# Patient Record
Sex: Female | Born: 1940 | Race: White | Hispanic: No | Smoking: Former smoker
Health system: Southern US, Community
[De-identification: ages and names within clinical notes are randomized; demographics above are authoritative.]

## PROBLEM LIST (undated history)

## (undated) DIAGNOSIS — M48061 Spinal stenosis, lumbar region without neurogenic claudication: Secondary | ICD-10-CM

## (undated) DIAGNOSIS — R519 Headache, unspecified: Secondary | ICD-10-CM

## (undated) DIAGNOSIS — E039 Hypothyroidism, unspecified: Secondary | ICD-10-CM

## (undated) DIAGNOSIS — M549 Dorsalgia, unspecified: Secondary | ICD-10-CM

## (undated) DIAGNOSIS — N390 Urinary tract infection, site not specified: Secondary | ICD-10-CM

## (undated) DIAGNOSIS — IMO0001 Reserved for inherently not codable concepts without codable children: Secondary | ICD-10-CM

## (undated) DIAGNOSIS — K219 Gastro-esophageal reflux disease without esophagitis: Secondary | ICD-10-CM

## (undated) DIAGNOSIS — I10 Essential (primary) hypertension: Secondary | ICD-10-CM

## (undated) DIAGNOSIS — H33319 Horseshoe tear of retina without detachment, unspecified eye: Secondary | ICD-10-CM

## (undated) DIAGNOSIS — I509 Heart failure, unspecified: Secondary | ICD-10-CM

## (undated) DIAGNOSIS — N261 Atrophy of kidney (terminal): Secondary | ICD-10-CM

## (undated) DIAGNOSIS — J189 Pneumonia, unspecified organism: Secondary | ICD-10-CM

## (undated) DIAGNOSIS — F329 Major depressive disorder, single episode, unspecified: Secondary | ICD-10-CM

## (undated) DIAGNOSIS — F32A Depression, unspecified: Secondary | ICD-10-CM

## (undated) DIAGNOSIS — J449 Chronic obstructive pulmonary disease, unspecified: Secondary | ICD-10-CM

## (undated) DIAGNOSIS — Z973 Presence of spectacles and contact lenses: Secondary | ICD-10-CM

## (undated) DIAGNOSIS — C801 Malignant (primary) neoplasm, unspecified: Secondary | ICD-10-CM

## (undated) DIAGNOSIS — R112 Nausea with vomiting, unspecified: Secondary | ICD-10-CM

## (undated) DIAGNOSIS — D649 Anemia, unspecified: Secondary | ICD-10-CM

## (undated) DIAGNOSIS — E785 Hyperlipidemia, unspecified: Secondary | ICD-10-CM

## (undated) DIAGNOSIS — F419 Anxiety disorder, unspecified: Secondary | ICD-10-CM

## (undated) DIAGNOSIS — G4730 Sleep apnea, unspecified: Secondary | ICD-10-CM

## (undated) DIAGNOSIS — G8929 Other chronic pain: Secondary | ICD-10-CM

## (undated) DIAGNOSIS — R51 Headache: Secondary | ICD-10-CM

## (undated) DIAGNOSIS — G459 Transient cerebral ischemic attack, unspecified: Secondary | ICD-10-CM

## (undated) DIAGNOSIS — J45909 Unspecified asthma, uncomplicated: Secondary | ICD-10-CM

## (undated) DIAGNOSIS — Z9889 Other specified postprocedural states: Secondary | ICD-10-CM

## (undated) DIAGNOSIS — R569 Unspecified convulsions: Secondary | ICD-10-CM

## (undated) DIAGNOSIS — M1990 Unspecified osteoarthritis, unspecified site: Secondary | ICD-10-CM

## (undated) HISTORY — PX: OTHER SURGICAL HISTORY: SHX169

## (undated) HISTORY — PX: CARDIAC SURGERY: SHX584

## (undated) HISTORY — PX: CHOLECYSTECTOMY: SHX55

## (undated) HISTORY — PX: COLONOSCOPY: SHX174

## (undated) HISTORY — PX: CATARACT EXTRACTION W/ INTRAOCULAR LENS  IMPLANT, BILATERAL: SHX1307

## (undated) HISTORY — DX: Essential (primary) hypertension: I10

## (undated) HISTORY — PX: TUBAL LIGATION: SHX77

## (undated) HISTORY — PX: BREAST SURGERY: SHX581

## (undated) HISTORY — PX: MULTIPLE TOOTH EXTRACTIONS: SHX2053

## (undated) HISTORY — DX: Hyperlipidemia, unspecified: E78.5

## (undated) HISTORY — PX: LUMBAR EPIDURAL INJECTION: SHX1980

## (undated) HISTORY — PX: HERNIA REPAIR: SHX51

---

## 2014-03-25 ENCOUNTER — Encounter: Payer: Self-pay | Admitting: Family Medicine

## 2014-03-25 ENCOUNTER — Ambulatory Visit (INDEPENDENT_AMBULATORY_CARE_PROVIDER_SITE_OTHER): Payer: Medicare HMO | Admitting: Family Medicine

## 2014-03-25 ENCOUNTER — Ambulatory Visit (HOSPITAL_BASED_OUTPATIENT_CLINIC_OR_DEPARTMENT_OTHER)
Admission: RE | Admit: 2014-03-25 | Discharge: 2014-03-25 | Disposition: A | Discharge status: Home / Self Care | Payer: Medicare HMO | Source: Ambulatory Visit | Attending: Family Medicine | Admitting: Family Medicine

## 2014-03-25 ENCOUNTER — Encounter (INDEPENDENT_AMBULATORY_CARE_PROVIDER_SITE_OTHER): Payer: Self-pay

## 2014-03-25 VITALS — BP 119/71 | HR 70 | Temp 97.9°F | Resp 22 | Ht 68.0 in | Wt 229.6 lb

## 2014-03-25 DIAGNOSIS — S6990XA Unspecified injury of unspecified wrist, hand and finger(s), initial encounter: Secondary | ICD-10-CM

## 2014-03-25 DIAGNOSIS — Z4789 Encounter for other orthopedic aftercare: Secondary | ICD-10-CM | POA: Insufficient documentation

## 2014-03-25 DIAGNOSIS — IMO0001 Reserved for inherently not codable concepts without codable children: Secondary | ICD-10-CM

## 2014-03-25 DIAGNOSIS — M79641 Pain in right hand: Secondary | ICD-10-CM

## 2014-03-25 DIAGNOSIS — M79609 Pain in unspecified limb: Secondary | ICD-10-CM

## 2014-03-25 DIAGNOSIS — S6980XA Other specified injuries of unspecified wrist, hand and finger(s), initial encounter: Secondary | ICD-10-CM

## 2014-03-29 ENCOUNTER — Encounter: Payer: Self-pay | Admitting: Family Medicine

## 2014-03-29 DIAGNOSIS — IMO0001 Reserved for inherently not codable concepts without codable children: Secondary | ICD-10-CM | POA: Insufficient documentation

## 2014-03-29 NOTE — Assessment & Plan Note (Signed)
2/2 sprain of 3rd MCP joint.  No fracture in this area - no pain where radiologist read possible avulsion fracture.  Discontinue splinting - buddy tape for next 2 weeks then as needed.  Icing, tylenol, nsaids as needed.  F/u prn.

## 2014-03-29 NOTE — Progress Notes (Signed)
Patient ID: Jill Bryant, female   DOB: 01/04/1941, 73 y.o.   MRN: [id number]  PCP: Rodena Medin, MD  Subjective:   HPI: Patient is a 73 y.o. female here for right hand pain.  Patient reports 4 days ago she was flicking a bug with her 3rd finger when she felt pain dorsal aspect of around MCP joint of this finger. Associated swelling but no bruising. Didn't hit anything when she tried to flick the bug. No prior injuries. Had x-rays at an urgent care (not available) and was told she broke proximal phalanx - put in an extension splint. Is right handed. Pain down to 1/10 currently. No numbness/tingling.  Past Medical History  Diagnosis Date  . Hyperlipidemia   . Hypertension     No current outpatient prescriptions on file prior to visit.   No current facility-administered medications on file prior to visit.    Past Surgical History  Procedure Laterality Date  . Cardiac surgery      valve replacement 2013; aorta replacement 2002  . Hernia repair    . Cholecystectomy      Allergies  Allergen Reactions  . Metoprolol Other (See Comments)    Bradycardia and fatigue  . Morphine And Related Other (See Comments)    Hallucinations/seizures  . Tape Rash    History   Social History  . Marital Status: Widowed    Spouse Name: N/A    Number of Children: N/A  . Years of Education: N/A   Occupational History  . Not on file.   Social History Main Topics  . Smoking status: Former Smoker    Quit date: 10/30/2003  . Smokeless tobacco: Not on file  . Alcohol Use: Not on file  . Drug Use: Not on file  . Sexual Activity: Not on file   Other Topics Concern  . Not on file   Social History Narrative  . No narrative on file    No family history on file.  BP 119/71  Pulse 70  Temp(Src) 97.9 F (36.6 C) (Oral)  Resp 22  Ht 5\' 8"  (1.727 m)  Wt 229 lb 9.6 oz (104.146 kg)  BMI 34.92 kg/m2  SpO2 96%  Review of Systems: See HPI above.    Objective:  Physical  Exam:  Gen: NAD  Right hand: Swelling about 3rd MCP joint circumferentially.  No bruising, other deformity. Mild TTP about 3rd MCP joint.  No phalanx, other hand tenderness. Able to flex and extend 3rd MCP, PIP, DIP joints against resistance without pain. NVI distally.    Assessment & Plan:  1. Right 3rd digit injury - 2/2 sprain of 3rd MCP joint.  No fracture in this area - no pain where radiologist read possible avulsion fracture.  Discontinue splinting - buddy tape for next 2 weeks then as needed.  Icing, tylenol, nsaids as needed.  F/u prn.

## 2015-01-27 ENCOUNTER — Telehealth (HOSPITAL_COMMUNITY): Payer: Self-pay | Admitting: *Deleted

## 2015-01-27 NOTE — Telephone Encounter (Addendum)
Pt left a message saying that she needs to have an echo. She had an appt with Dr. Debara Pickett on 04/25 but its been cancelled. Pt will have her pulmonary physician send over an order for the echo

## 2015-01-28 ENCOUNTER — Other Ambulatory Visit (HOSPITAL_COMMUNITY): Payer: Self-pay | Admitting: Medical

## 2015-01-28 DIAGNOSIS — R0609 Other forms of dyspnea: Secondary | ICD-10-CM

## 2015-01-28 DIAGNOSIS — R0902 Hypoxemia: Secondary | ICD-10-CM

## 2015-02-16 ENCOUNTER — Ambulatory Visit (HOSPITAL_COMMUNITY)
Admission: RE | Admit: 2015-02-16 | Discharge: 2015-02-16 | Disposition: A | Discharge status: Home / Self Care | Payer: Medicare HMO | Source: Ambulatory Visit | Attending: Cardiovascular Disease | Admitting: Cardiovascular Disease

## 2015-02-16 ENCOUNTER — Telehealth: Payer: Self-pay | Admitting: Internal Medicine

## 2015-02-16 DIAGNOSIS — R0609 Other forms of dyspnea: Secondary | ICD-10-CM | POA: Insufficient documentation

## 2015-02-16 DIAGNOSIS — E785 Hyperlipidemia, unspecified: Secondary | ICD-10-CM | POA: Diagnosis not present

## 2015-02-16 DIAGNOSIS — I10 Essential (primary) hypertension: Secondary | ICD-10-CM | POA: Diagnosis not present

## 2015-02-16 DIAGNOSIS — Z87891 Personal history of nicotine dependence: Secondary | ICD-10-CM | POA: Insufficient documentation

## 2015-02-16 DIAGNOSIS — R0902 Hypoxemia: Secondary | ICD-10-CM

## 2015-02-16 NOTE — Progress Notes (Signed)
2D Echocardiogram Complete.  02/16/2015   Brynlynn Walko [place], RDCS

## 2015-02-17 NOTE — Telephone Encounter (Signed)
Close encounter 

## 2015-02-21 ENCOUNTER — Ambulatory Visit: Payer: Medicare HMO | Admitting: Internal Medicine

## 2015-02-23 ENCOUNTER — Ambulatory Visit (INDEPENDENT_AMBULATORY_CARE_PROVIDER_SITE_OTHER): Payer: Medicare HMO | Admitting: Internal Medicine

## 2015-02-23 ENCOUNTER — Encounter: Payer: Self-pay | Admitting: Internal Medicine

## 2015-02-23 VITALS — BP 122/70 | HR 84 | Ht 68.0 in | Wt 232.5 lb

## 2015-02-23 DIAGNOSIS — J449 Chronic obstructive pulmonary disease, unspecified: Secondary | ICD-10-CM

## 2015-02-23 DIAGNOSIS — I493 Ventricular premature depolarization: Secondary | ICD-10-CM | POA: Diagnosis not present

## 2015-02-23 DIAGNOSIS — Z95828 Presence of other vascular implants and grafts: Secondary | ICD-10-CM

## 2015-02-23 DIAGNOSIS — Z9989 Dependence on other enabling machines and devices: Secondary | ICD-10-CM

## 2015-02-23 DIAGNOSIS — I739 Peripheral vascular disease, unspecified: Secondary | ICD-10-CM

## 2015-02-23 DIAGNOSIS — Z954 Presence of other heart-valve replacement: Secondary | ICD-10-CM

## 2015-02-23 DIAGNOSIS — Z9889 Other specified postprocedural states: Secondary | ICD-10-CM | POA: Diagnosis not present

## 2015-02-23 DIAGNOSIS — G4733 Obstructive sleep apnea (adult) (pediatric): Secondary | ICD-10-CM

## 2015-02-23 DIAGNOSIS — I779 Disorder of arteries and arterioles, unspecified: Secondary | ICD-10-CM | POA: Insufficient documentation

## 2015-02-23 DIAGNOSIS — Z952 Presence of prosthetic heart valve: Secondary | ICD-10-CM

## 2015-02-23 DIAGNOSIS — I2510 Atherosclerotic heart disease of native coronary artery without angina pectoris: Secondary | ICD-10-CM

## 2015-02-23 DIAGNOSIS — I2583 Coronary atherosclerosis due to lipid rich plaque: Secondary | ICD-10-CM

## 2015-02-23 NOTE — Progress Notes (Signed)
OFFICE NOTE  Chief Complaint:  Establish a new cardiologist  Primary Care Physician: Rodena Medin, MD  HPI:  Jill Bryant is a pleasant 74 year old female with a list of about 40-50 different medical problems. She gets most of her care to the [facility] but due to new changes her insurance is no longer accepted there. She therefore sought out a new cardiologist in [place]. She actually lives in [place] but establish most of her care through [facility] [place] [facility] when she was originally living with family in that area. For a long time she lived in the [place] area. In 2002 she was diagnosed as having PAD and significant aortic occlusion. Ultimately she underwent aortobifem bypass by Dr. Thurnell Lose in 2002. 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 Jerelene Redden in [facility]. She was present followed by Dr. Baxter Hire back in 2013 and also saw Dr. Joylene Grapes. She has significant COPD and is followed by [facility]. She also has obstructive sleep apnea on CPAP and uses oxygen. She tells me she has stage III chronic kidney disease and a solitary kidney. She may also recently had an amaurosis fugax event. Currently she denies any chest pain. She reports stable shortness of breath with exertion. She denies palpitations but as noted have PVCs on her EKG today. She is not on beta blocker presumably due to her significant pulmonary disease and it is listed as an allergy. She told me that she had severe leg weakness previously on metoprolol.  PMHx:  Past Medical History  Diagnosis Date  . Hyperlipidemia   . Hypertension     Past Surgical History  Procedure Laterality Date  . Cardiac surgery      valve replacement 2013;  aorta replacement 2002  . Hernia repair    . Cholecystectomy      FAMHx:  Family History  Problem Relation Age of Onset  . Other Mother   . Alzheimer's disease Father     SOCHx:   reports that she quit smoking about 11 years ago. She does not have any smokeless tobacco history on file. Her alcohol and drug histories are not on file.  ALLERGIES:  Allergies  Allergen Reactions  . Metoprolol Other (See Comments)    Bradycardia and fatigue  . Morphine And Related Other (See Comments)    Hallucinations/seizures  . Tape Rash    ROS: A comprehensive review of systems was negative except for: Respiratory: positive for dyspnea on exertion  HOME MEDS: Current Outpatient Prescriptions  Medication Sig Dispense Refill  . albuterol (ACCUNEB) 0.63 MG/3ML nebulizer solution Take 1 ampule by nebulization 2 (two) times daily.    Marland Kitchen albuterol (PROVENTIL HFA;VENTOLIN HFA) 108 (90 BASE) MCG/ACT inhaler Inhale into the lungs as needed.    Marland Kitchen aspirin EC 81 MG tablet Take 81 mg by mouth daily.    . cetirizine (ZYRTEC) 10 MG tablet Take 10 mg by mouth daily.    . cyanocobalamin (,VITAMIN B-12,) 1000 MCG/ML injection Inject into the muscle every 30 (thirty) days.    . diazepam (VALIUM) 5 MG tablet Take 5 mg by mouth as needed.    Marland Kitchen FLUoxetine (PROZAC) 20 MG capsule Take 1 capsule by mouth daily.    . furosemide (LASIX) 40 MG tablet Take 40 mg  by mouth daily.    Marland Kitchen gabapentin (NEURONTIN) 300 MG capsule Take 600 mg by mouth at bedtime.     Marland Kitchen HYDROcodone-acetaminophen (NORCO/VICODIN) 5-325 MG per tablet Take by mouth as needed.    Marland Kitchen levothyroxine (SYNTHROID, LEVOTHROID) 112 MCG tablet Take 1 tablet by mouth daily.    Marland Kitchen lovastatin (MEVACOR) 40 MG tablet Take 40 mg by mouth at bedtime.     . nitroGLYCERIN (NITROSTAT) 0.4 MG SL tablet Place 0.4 mg under the tongue every 5 (five) minutes.    . NON FORMULARY at bedtime. CPAP    . Omega-3 1000 MG CAPS Take 1 capsule by mouth daily.    Marland Kitchen omeprazole (PRILOSEC)  20 MG capsule Take 20 mg by mouth daily.     . OXYGEN Inhale 2-4 L into the lungs at bedtime.    Marland Kitchen SPIRIVA HANDIHALER 18 MCG inhalation capsule daily.    Marland Kitchen telmisartan (MICARDIS) 40 MG tablet Take 40 mg by mouth daily.     . traMADol (ULTRAM) 50 MG tablet Take 100 mg by mouth every morning.     . Vitamin D, Ergocalciferol, (DRISDOL) 50000 UNITS CAPS capsule Take by mouth once a week.     No current facility-administered medications for this visit.    LABS/IMAGING: No results found for this or any previous visit (from the past 48 hour(s)). No results found.  WEIGHTS: Wt Readings from Last 3 Encounters:  02/23/15 232 lb 8 oz (105.461 kg)  03/25/14 229 lb 9.6 oz (104.146 kg)    VITALS: BP 122/70 mmHg  Pulse 84  Ht '5\' 8"'$  (1.727 m)  Wt 232 lb 8 oz (105.461 kg)  BMI 35.36 kg/m2  EXAM: General appearance: alert and no distress Neck: no carotid bruit and no JVD Lungs: diminished breath sounds bilaterally Heart: regular rate and rhythm and S1, S2 normal Abdomen: soft, non-tender; bowel sounds normal; no masses,  no organomegaly Extremities: extremities normal, atraumatic, no cyanosis or edema Pulses: 2+ and symmetric Skin: Skin color, texture, turgor normal. No rashes or lesions Neurologic: Grossly normal PSych: Pleasant  EKG: Sinus rhythm with PVCs at 84  ASSESSMENT: 1. 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 Status post aortobifem bypass in 2002 4. PVCs 5. Hypertension 6. Dyslipidemia 7. COPD on oxygen 8.   Obstructive sleep apnea on CPAP 9.   Carotid artery stenosis bilaterally  PLAN: 1.  Jill Bryant has numerous medical problems and I spent almost 60 minutes reviewing her chart and records. She did have pericardial valve replacement in 2013. A recent echocardiogram was performed in our office as ordered by her pulmonologist. This demonstrated a normal gradient across her aortic  valve. LV function is preserved and there is left atrial enlargement. She has not had reassessment of her aortobifem bypass and a number of years. I recommend lower extremity arterial Dopplers. She recently had carotid Dopplers and we will obtain those results. She will need reassessment of her lipid profile and I'll plan to do that likely when she returns in a month or 2 for follow-up of her Dopplers.   Thanks for the kind referral.  Pixie Casino, MD, [facility] Attending Cardiologist [facility]  Elihu Milstein C 02/23/2015, 11:18 AM

## 2015-02-23 NOTE — Patient Instructions (Signed)
Your physician has requested that you have a lower extremity arterial duplex. This test is an ultrasound of the arteries in the legs. It looks at arterial blood flow in the legs. Allow one hour for Lower Arterial scans. There are no restrictions or special instructions  Your physician recommends that you schedule a follow-up appointment in: 1-2 months with Dr. Debara Pickett.

## 2015-03-04 ENCOUNTER — Telehealth: Payer: Self-pay | Admitting: Internal Medicine

## 2015-03-04 NOTE — Telephone Encounter (Signed)
Received records requested from [facility] [place] for appointment on 04/27/15 with Dr Debara Pickett.  Records given to [facility] (medical records) for Dr Lysbeth Penner schedule on 04/27/15. lp

## 2015-03-10 ENCOUNTER — Ambulatory Visit (HOSPITAL_COMMUNITY)
Admission: RE | Admit: 2015-03-10 | Discharge: 2015-03-10 | Disposition: A | Discharge status: Home / Self Care | Payer: Medicare HMO | Source: Ambulatory Visit | Attending: Cardiovascular Disease | Admitting: Cardiovascular Disease

## 2015-03-10 DIAGNOSIS — Z48812 Encounter for surgical aftercare following surgery on the circulatory system: Secondary | ICD-10-CM | POA: Diagnosis not present

## 2015-03-10 DIAGNOSIS — Z9889 Other specified postprocedural states: Secondary | ICD-10-CM

## 2015-03-10 DIAGNOSIS — I10 Essential (primary) hypertension: Secondary | ICD-10-CM | POA: Diagnosis not present

## 2015-03-10 DIAGNOSIS — I739 Peripheral vascular disease, unspecified: Secondary | ICD-10-CM | POA: Diagnosis not present

## 2015-03-10 DIAGNOSIS — Z95828 Presence of other vascular implants and grafts: Secondary | ICD-10-CM

## 2015-03-23 ENCOUNTER — Other Ambulatory Visit: Payer: Self-pay | Admitting: *Deleted

## 2015-03-23 DIAGNOSIS — Z95828 Presence of other vascular implants and grafts: Secondary | ICD-10-CM

## 2015-03-23 DIAGNOSIS — I739 Peripheral vascular disease, unspecified: Secondary | ICD-10-CM

## 2015-04-21 ENCOUNTER — Telehealth: Payer: Self-pay | Admitting: Internal Medicine

## 2015-04-21 NOTE — Telephone Encounter (Signed)
Received records from [facility] for appointment with Dr Debara Pickett on 04/27/15. Given to [employer] (medical records) for Dr Lysbeth Penner schedule on 04/27/15. lp

## 2015-04-27 ENCOUNTER — Ambulatory Visit (INDEPENDENT_AMBULATORY_CARE_PROVIDER_SITE_OTHER): Payer: Medicare HMO | Admitting: Internal Medicine

## 2015-04-27 ENCOUNTER — Encounter: Payer: Self-pay | Admitting: Internal Medicine

## 2015-04-27 VITALS — BP 114/76 | HR 68 | Ht 68.0 in | Wt 229.1 lb

## 2015-04-27 DIAGNOSIS — Z79899 Other long term (current) drug therapy: Secondary | ICD-10-CM | POA: Diagnosis not present

## 2015-04-27 DIAGNOSIS — Z954 Presence of other heart-valve replacement: Secondary | ICD-10-CM

## 2015-04-27 DIAGNOSIS — Z95828 Presence of other vascular implants and grafts: Secondary | ICD-10-CM

## 2015-04-27 DIAGNOSIS — E785 Hyperlipidemia, unspecified: Secondary | ICD-10-CM | POA: Diagnosis not present

## 2015-04-27 DIAGNOSIS — I739 Peripheral vascular disease, unspecified: Principal | ICD-10-CM

## 2015-04-27 DIAGNOSIS — I2510 Atherosclerotic heart disease of native coronary artery without angina pectoris: Secondary | ICD-10-CM

## 2015-04-27 DIAGNOSIS — Z9889 Other specified postprocedural states: Secondary | ICD-10-CM

## 2015-04-27 DIAGNOSIS — I779 Disorder of arteries and arterioles, unspecified: Secondary | ICD-10-CM

## 2015-04-27 DIAGNOSIS — I2583 Coronary atherosclerosis due to lipid rich plaque: Secondary | ICD-10-CM

## 2015-04-27 DIAGNOSIS — I493 Ventricular premature depolarization: Secondary | ICD-10-CM | POA: Diagnosis not present

## 2015-04-27 DIAGNOSIS — K219 Gastro-esophageal reflux disease without esophagitis: Secondary | ICD-10-CM

## 2015-04-27 DIAGNOSIS — Z952 Presence of prosthetic heart valve: Secondary | ICD-10-CM

## 2015-04-27 MED ORDER — NITROGLYCERIN 0.4 MG SL SUBL: 0.4000 mg | SUBLINGUAL_TABLET | SUBLINGUAL | Status: AC

## 2015-04-27 NOTE — Progress Notes (Signed)
OFFICE NOTE  Chief Complaint:  Follow-up, several "anginal episodes"  Primary Care Physician: Rodena Medin, MD  HPI:  Jill Bryant is a pleasant 74 year old female with a list of about 40-50 different medical problems. She gets most of her care to the [facility] but due to new changes her insurance is no longer accepted there. She therefore sought out a new cardiologist in [place]. She actually lives in [place] but establish most of her care through the [facility] [place] [facility] when she was originally living with family in that area. For a long time she lived in the [place] area. In 2002 she was diagnosed as having PAD and significant aortic occlusion. Ultimately she underwent aortobifem bypass by Dr. Thurnell Lose in 2002. 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 Jerelene Redden in [facility]. She was present followed by Dr. Baxter Hire back in 2013 and also saw Dr. Joylene Grapes. She has significant COPD and is followed by [facility]. She also has obstructive sleep apnea on CPAP and uses oxygen. She tells me she has stage III chronic kidney disease and a solitary kidney. She may also recently had an amaurosis fugax event. Currently she denies any chest pain. She reports stable shortness of breath with exertion. She denies palpitations but as noted have PVCs on her EKG today. She is not on beta blocker presumably due to her significant pulmonary disease and it is listed as an allergy. She told me that she had severe leg weakness previously on metoprolol.  I saw Mrs. from her back in the office today. She reports that since we saw each other she's had a couple of episodes of "angina". These episodes included chest discomfort for  which she took 2 nitroglycerin and 3 baby aspirin. The symptoms did not resolve until about an hour later. She described it as a tightness in the center of her chest which radiated up center of her chest to her jaw. She does have a history of reflux and takes a Prilosec but the symptoms are somewhat different. To me, these episodes do not sound like angina. She has had a number of heart catheterizations, both in 2011 and is well as most recently in 2013. Neither study has demonstrated any obstructive coronary disease.  PMHx:  Past Medical History  Diagnosis Date  . Hyperlipidemia   . Hypertension     Past Surgical History  Procedure Laterality Date  . Cardiac surgery      valve replacement 2013; aorta replacement 2002  . Hernia repair    . Cholecystectomy      FAMHx:  Family History  Problem Relation Age of Onset  . Other Mother   . Alzheimer's disease Father     SOCHx:   reports that she quit smoking about 11 years ago. She does not have any smokeless tobacco history on file. Her alcohol and drug histories are not on file.  ALLERGIES:  Allergies  Allergen Reactions  . Amlodipine Swelling    Feet/toes swelling  . Metoprolol Other (See Comments)    Bradycardia and fatigue  . Morphine And Related Other (See Comments)    Hallucinations/seizures  . Tape Rash    ROS: A comprehensive review of systems was negative except for: Respiratory: positive for dyspnea on exertion Cardiovascular: positive  for chest pain  HOME MEDS: Current Outpatient Prescriptions  Medication Sig Dispense Refill  . albuterol (ACCUNEB) 0.63 MG/3ML nebulizer solution Take 1 ampule by nebulization 2 (two) times daily.    Marland Kitchen albuterol (PROVENTIL HFA;VENTOLIN HFA) 108 (90 BASE) MCG/ACT inhaler Inhale into the lungs as needed.    Marland Kitchen aspirin EC 81 MG tablet Take 81 mg by mouth daily.    . cetirizine (ZYRTEC) 10 MG tablet Take 10 mg by mouth daily.    . cyanocobalamin (,VITAMIN B-12,) 1000 MCG/ML injection  Inject into the muscle every 30 (thirty) days.    . diazepam (VALIUM) 5 MG tablet Take 5 mg by mouth as needed.    Marland Kitchen FLUoxetine (PROZAC) 20 MG capsule Take 1 capsule by mouth daily.    . furosemide (LASIX) 40 MG tablet Take 40 mg by mouth daily.    Marland Kitchen gabapentin (NEURONTIN) 300 MG capsule Take 600 mg by mouth at bedtime.     Marland Kitchen HYDROcodone-acetaminophen (NORCO/VICODIN) 5-325 MG per tablet Take by mouth as needed.    Marland Kitchen levothyroxine (SYNTHROID, LEVOTHROID) 112 MCG tablet Take 1 tablet by mouth daily.    Marland Kitchen lovastatin (MEVACOR) 40 MG tablet Take 40 mg by mouth at bedtime.     . nitroGLYCERIN (NITROSTAT) 0.4 MG SL tablet Place 1 tablet (0.4 mg total) under the tongue every 5 (five) minutes. 25 tablet 3  . NON FORMULARY at bedtime. CPAP    . Omega-3 1000 MG CAPS Take 1 capsule by mouth daily.    Marland Kitchen omeprazole (PRILOSEC) 20 MG capsule Take 20 mg by mouth daily.     . OXYGEN Inhale 2-4 L into the lungs at bedtime.    Marland Kitchen SPIRIVA HANDIHALER 18 MCG inhalation capsule daily.    Marland Kitchen telmisartan (MICARDIS) 40 MG tablet Take 20 mg by mouth daily.     . traMADol (ULTRAM) 50 MG tablet Take 100 mg by mouth every morning.     . Vitamin D, Ergocalciferol, (DRISDOL) 50000 UNITS CAPS capsule Take by mouth once a week.     No current facility-administered medications for this visit.    LABS/IMAGING: No results found for this or any previous visit (from the past 48 hour(s)). No results found.  WEIGHTS: Wt Readings from Last 3 Encounters:  04/27/15 229 lb 1.6 oz (103.919 kg)  02/23/15 232 lb 8 oz (105.461 kg)  03/25/14 229 lb 9.6 oz (104.146 kg)    VITALS: BP 114/76 mmHg  Pulse 68  Ht '5\' 8"'$  (1.727 m)  Wt 229 lb 1.6 oz (103.919 kg)  BMI 34.84 kg/m2  EXAM: General appearance: alert and no distress Neck: no carotid bruit and no JVD Lungs: diminished breath sounds bilaterally Heart: regular rate and rhythm and S1, S2 normal Abdomen: soft, non-tender; bowel sounds normal; no masses,  no  organomegaly Extremities: extremities normal, atraumatic, no cyanosis or edema Pulses: 2+ and symmetric Skin: Skin color, texture, turgor normal. No rashes or lesions Neurologic: Grossly normal PSych: Pleasant  EKG: Sinus rhythm with PVCs at 68, nonspecific ST and T changes  ASSESSMENT: 1. 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 Status post aortobifem bypass in 2002 4. PVCs 5. Hypertension 6. Dyslipidemia 7. COPD on oxygen 8.   Obstructive sleep apnea on CPAP 9.   Carotid artery stenosis bilaterally 10. Chest pain, most likely GERD  PLAN: 1.  [name] has been having a few episodes of chest discomfort which I suspect is  GERD. The symptoms are not relieved with nitroglycerin. I've advised her to consider taking Pepcid or Zantac in addition to her Prilosec during these episodes. She recently had a lower extremity arterial Dopplers which indicated patent bypass grafts. I did receive records indicating that she has mild left internal carotid artery stenosis and moderate right internal carotid artery stenosis. This is by Dopplers in November 2015. She will be due for repeat carotid Dopplers in November 2016. She is also overdue for cholesterol profile and will go ahead and check that as well as a comprehensive metabolic profile today.   Plan to see her back in 6 months to review her carotid Dopplers.  Pixie Casino, MD, [facility] Attending Cardiologist [place] 04/27/2015, 1:13 PM

## 2015-04-27 NOTE — Patient Instructions (Addendum)
Your physician has requested that you have a carotid duplex in November. This test is an ultrasound of the carotid arteries in your neck. It looks at blood flow through these arteries that supply the brain with blood. Allow one hour for this exam. There are no restrictions or special instructions.  Your physician recommends that you schedule a follow-up appointment in December with Dr. Debara Pickett   Please have fasting labs

## 2015-05-10 LAB — COMPREHENSIVE METABOLIC PANEL
ALT: 20 U/L (ref 0–35)
AST: 28 U/L (ref 0–37)
Albumin: 3.9 g/dL (ref 3.5–5.2)
Alkaline Phosphatase: 57 U/L (ref 39–117)
BILIRUBIN TOTAL: 0.8 mg/dL (ref 0.2–1.2)
BUN: 14 mg/dL (ref 6–23)
CALCIUM: 9.4 mg/dL (ref 8.4–10.5)
CHLORIDE: 102 meq/L (ref 96–112)
CO2: 23 meq/L (ref 19–32)
CREATININE: 1.38 mg/dL — AB (ref 0.50–1.10)
Glucose, Bld: 98 mg/dL (ref 70–99)
POTASSIUM: 4.2 meq/L (ref 3.5–5.3)
SODIUM: 142 meq/L (ref 135–145)
TOTAL PROTEIN: 6.8 g/dL (ref 6.0–8.3)

## 2015-05-12 ENCOUNTER — Encounter: Payer: Self-pay | Admitting: *Deleted

## 2015-05-12 LAB — NMR LIPOPROFILE WITH LIPIDS
Cholesterol, Total: 166 mg/dL (ref 100–199)
HDL Particle Number: 29.7 umol/L — ABNORMAL LOW (ref >=30.5)
HDL Size: 8.8 nm — ABNORMAL LOW (ref >=9.2)
HDL-C: 44 mg/dL (ref >39)
LARGE VLDL-P: 3.9 nmol/L — AB (ref <=2.7)
LDL (calc): 88 mg/dL (ref 0–99)
LDL PARTICLE NUMBER: 1147 nmol/L — AB (ref <1000)
LDL Size: 21.3 nm (ref >=20.8)
LP-IR Score: 51 — ABNORMAL HIGH (ref <=45)
Large HDL-P: 3.6 umol/L — ABNORMAL LOW (ref >=4.8)
Small LDL Particle Number: 502 nmol/L (ref <=527)
TRIGLYCERIDES: 172 mg/dL — AB (ref 0–149)
VLDL Size: 42.9 nm (ref <=46.6)

## 2015-08-17 ENCOUNTER — Encounter (HOSPITAL_COMMUNITY): Payer: Self-pay

## 2015-08-17 ENCOUNTER — Emergency Department (HOSPITAL_COMMUNITY)
Admission: EM | Admit: 2015-08-17 | Discharge: 2015-08-17 | Disposition: A | Discharge status: Home / Self Care | Payer: Medicare HMO | Attending: Emergency Medicine | Admitting: Emergency Medicine

## 2015-08-17 DIAGNOSIS — Z87440 Personal history of urinary (tract) infections: Secondary | ICD-10-CM | POA: Diagnosis not present

## 2015-08-17 DIAGNOSIS — Z7982 Long term (current) use of aspirin: Secondary | ICD-10-CM | POA: Insufficient documentation

## 2015-08-17 DIAGNOSIS — R05 Cough: Secondary | ICD-10-CM | POA: Insufficient documentation

## 2015-08-17 DIAGNOSIS — Z79899 Other long term (current) drug therapy: Secondary | ICD-10-CM | POA: Diagnosis not present

## 2015-08-17 DIAGNOSIS — Z87891 Personal history of nicotine dependence: Secondary | ICD-10-CM | POA: Diagnosis not present

## 2015-08-17 DIAGNOSIS — E785 Hyperlipidemia, unspecified: Secondary | ICD-10-CM | POA: Diagnosis not present

## 2015-08-17 DIAGNOSIS — R5383 Other fatigue: Secondary | ICD-10-CM | POA: Diagnosis not present

## 2015-08-17 DIAGNOSIS — I10 Essential (primary) hypertension: Secondary | ICD-10-CM | POA: Diagnosis not present

## 2015-08-17 DIAGNOSIS — R531 Weakness: Secondary | ICD-10-CM

## 2015-08-17 DIAGNOSIS — M549 Dorsalgia, unspecified: Secondary | ICD-10-CM | POA: Diagnosis present

## 2015-08-17 DIAGNOSIS — G8929 Other chronic pain: Secondary | ICD-10-CM | POA: Insufficient documentation

## 2015-08-17 HISTORY — DX: Dorsalgia, unspecified: M54.9

## 2015-08-17 HISTORY — DX: Other chronic pain: G89.29

## 2015-08-17 HISTORY — DX: Urinary tract infection, site not specified: N39.0

## 2015-08-17 LAB — URINALYSIS, ROUTINE W REFLEX MICROSCOPIC
Glucose, UA: NEGATIVE mg/dL
Hgb urine dipstick: NEGATIVE
KETONES UR: NEGATIVE mg/dL
Nitrite: NEGATIVE
PROTEIN: NEGATIVE mg/dL
Specific Gravity, Urine: 1.021 (ref 1.005–1.030)
UROBILINOGEN UA: 1 mg/dL (ref 0.0–1.0)
pH: 5 (ref 5.0–8.0)

## 2015-08-17 LAB — URINE MICROSCOPIC-ADD ON

## 2015-08-17 LAB — I-STAT CHEM 8, ED
BUN: 21 mg/dL — ABNORMAL HIGH (ref 6–20)
CALCIUM ION: 1.08 mmol/L — AB (ref 1.13–1.30)
CHLORIDE: 102 mmol/L (ref 101–111)
Creatinine, Ser: 1 mg/dL (ref 0.44–1.00)
Glucose, Bld: 81 mg/dL (ref 65–99)
HCT: 33 % — ABNORMAL LOW (ref 36.0–46.0)
HEMOGLOBIN: 11.2 g/dL — AB (ref 12.0–15.0)
Potassium: 3.8 mmol/L (ref 3.5–5.1)
SODIUM: 137 mmol/L (ref 135–145)
TCO2: 24 mmol/L (ref 0–100)

## 2015-08-17 MED ORDER — SODIUM CHLORIDE 0.9 % IV BOLUS (SEPSIS)
1000.0000 mL | Freq: Once | INTRAVENOUS | Status: AC
  Administered 2015-08-17: 1000 mL via INTRAVENOUS

## 2015-08-17 MED ORDER — ONDANSETRON HCL 4 MG/2ML IJ SOLN
4.0000 mg | Freq: Once | INTRAMUSCULAR | Status: AC
  Administered 2015-08-17: 4 mg via INTRAVENOUS
  Filled 2015-08-17: qty 2

## 2015-08-17 NOTE — ED Notes (Signed)
She c/o generalized weakness, plus "dark" urine since Sunday.  She also c/o chronic low back pain "I have a disc problem".  She is oriented x 3 with normal speech and is in no distress.

## 2015-08-17 NOTE — Discharge Instructions (Signed)

## 2015-08-17 NOTE — ED Provider Notes (Signed)
CSN: [id number]     Arrival date & time 08/17/15  1055 History   First MD Initiated Contact with Patient 08/17/15 1146     Chief Complaint  Patient presents with  . Back Pain     (Consider location/radiation/quality/duration/timing/severity/associated sxs/prior Treatment) Patient is a 74 y.o. female presenting with general illness. The history is provided by the patient.  Illness Severity:  Moderate Onset quality:  Gradual Duration:  2 months Timing:  Constant Progression:  Worsening Chronicity:  New Associated symptoms: cough and fatigue   Associated symptoms: no chest pain, no congestion, no fever, no headaches, no myalgias, no nausea, no rhinorrhea, no shortness of breath, no vomiting and no wheezing     74 yo F with a chief complaint of weakness. Has been going on for couple months. Patient was seen at [facility] regional admitted for about 3 or 4 days for urinary tract infection. Patient was also complaining of at time of bilateral lower extremity weakness. For that she had an MRI that was read as negative for acute disease process but had significant chronic findings. Patient had an epidural injection at that facility. Patient is continued to have similar symptoms in her legs. Patient is also complaining of some dark urine some generalized myalgias and generalized weakness at home. Patient having cough and congestion. Denies abdominal pain nausea vomiting.  Past Medical History  Diagnosis Date  . Hyperlipidemia   . Hypertension   . UTI (lower urinary tract infection)   . Back pain, chronic    Past Surgical History  Procedure Laterality Date  . Cardiac surgery      valve replacement 2013; aorta replacement 2002  . Hernia repair    . Cholecystectomy    . Lumbar epidural injection     Family History  Problem Relation Age of Onset  . Other Mother   . Alzheimer's disease Father    Social History  Substance Use Topics  . Smoking status: Former Smoker    Quit date:  10/30/2003  . Smokeless tobacco: None  . Alcohol Use: No   OB History    No data available     Review of Systems  Constitutional: Positive for fatigue. Negative for fever and chills.  HENT: Negative for congestion and rhinorrhea.   Eyes: Negative for redness and visual disturbance.  Respiratory: Positive for cough. Negative for shortness of breath and wheezing.   Cardiovascular: Negative for chest pain and palpitations.  Gastrointestinal: Negative for nausea and vomiting.  Genitourinary: Negative for dysuria and urgency.  Musculoskeletal: Negative for myalgias and arthralgias.  Skin: Negative for pallor and wound.  Neurological: Negative for dizziness and headaches.      Allergies  Amlodipine; Metoprolol; Morphine and related; and Tape  Home Medications   Prior to Admission medications   Medication Sig Start Date End Date Taking? Authorizing Provider  acetaminophen (TYLENOL) 500 MG tablet Take 1,000 mg by mouth every 6 (six) hours as needed for moderate pain.   Yes Historical Provider, MD  albuterol (ACCUNEB) 0.63 MG/3ML nebulizer solution Take 1 ampule by nebulization 2 (two) times daily.   Yes Historical Provider, MD  albuterol (PROVENTIL HFA;VENTOLIN HFA) 108 (90 BASE) MCG/ACT inhaler Inhale into the lungs as needed.   Yes Historical Provider, MD  aspirin EC 81 MG tablet Take 81 mg by mouth daily.   Yes Historical Provider, MD  butalbital-acetaminophen-caffeine (FIORICET, ESGIC) 50-325-40 MG tablet Take 1 tablet by mouth every 4 (four) hours as needed for headache.   Yes Historical  Provider, MD  cetirizine (ZYRTEC) 10 MG tablet Take 10 mg by mouth daily.   Yes Historical Provider, MD  diazepam (VALIUM) 5 MG tablet Take 5 mg by mouth every 6 (six) hours as needed for sedation.    Yes Historical Provider, MD  fexofenadine (ALLEGRA) 180 MG tablet Take 180 mg by mouth daily.   Yes Historical Provider, MD  FLUoxetine (PROZAC) 20 MG capsule Take 1 capsule by mouth daily. 04/08/14   Yes Historical Provider, MD  furosemide (LASIX) 40 MG tablet Take 40 mg by mouth 2 (two) times daily.    Yes Historical Provider, MD  gabapentin (NEURONTIN) 300 MG capsule Take 600 mg by mouth at bedtime.  03/18/14  Yes Historical Provider, MD  HYDROcodone-acetaminophen (NORCO/VICODIN) 5-325 MG per tablet Take 1 tablet by mouth every 6 (six) hours as needed for moderate pain.  10/15/14  Yes Historical Provider, MD  levothyroxine (SYNTHROID, LEVOTHROID) 112 MCG tablet Take 1 tablet by mouth daily. 04/13/14  Yes Historical Provider, MD  nitroGLYCERIN (NITROSTAT) 0.4 MG SL tablet Place 1 tablet (0.4 mg total) under the tongue every 5 (five) minutes. 04/27/15  Yes Pixie Casino, MD  Omega-3 1000 MG CAPS Take 1 capsule by mouth daily.   Yes Historical Provider, MD  omeprazole (PRILOSEC) 20 MG capsule Take 20 mg by mouth daily.  03/18/14  Yes Historical Provider, MD  OXYGEN Inhale 2-4 L into the lungs at bedtime.   Yes Historical Provider, MD  SPIRIVA HANDIHALER 18 MCG inhalation capsule daily. 01/08/15  Yes Historical Provider, MD  telmisartan (MICARDIS) 40 MG tablet Take 20 mg by mouth daily.  01/19/14  Yes Historical Provider, MD  traMADol (ULTRAM) 50 MG tablet Take 100 mg by mouth every morning.  02/12/14  Yes Historical Provider, MD  lovastatin (MEVACOR) 40 MG tablet Take 40 mg by mouth at bedtime.    Historical Provider, MD   BP 127/46 mmHg  Pulse 84  Temp(Src) 98.6 F (37 C) (Oral)  Resp 20  SpO2 94% Physical Exam  Constitutional: She is oriented to person, place, and time. She appears well-developed and well-nourished. No distress.  HENT:  Head: Normocephalic and atraumatic.  Eyes: EOM are normal. Pupils are equal, round, and reactive to light.  Neck: Normal range of motion. Neck supple.  Cardiovascular: Normal rate and regular rhythm.  Exam reveals no gallop and no friction rub.   No murmur heard. Pulmonary/Chest: Effort normal. She has no wheezes. She has no rales.  Abdominal: Soft. She  exhibits no distension. There is no tenderness. There is no rebound and no guarding.  Musculoskeletal: She exhibits no edema or tenderness.  Neurological: She is alert and oriented to person, place, and time.  Skin: Skin is warm and dry. She is not diaphoretic.  Psychiatric: She has a normal mood and affect. Her behavior is normal.  Nursing note and vitals reviewed.   ED Course  Procedures (including critical care time) Labs Review Labs Reviewed  URINALYSIS, ROUTINE W REFLEX MICROSCOPIC (NOT AT [facility]) - Abnormal; Notable for the following:    Color, Urine AMBER (*)    APPearance CLOUDY (*)    Bilirubin Urine SMALL (*)    Leukocytes, UA MODERATE (*)    All other components within normal limits  URINE MICROSCOPIC-ADD ON - Abnormal; Notable for the following:    Squamous Epithelial / LPF MANY (*)    Bacteria, UA MANY (*)    All other components within normal limits  I-STAT CHEM 8, ED - Abnormal; Notable  for the following:    BUN 21 (*)    Calcium, Ion 1.08 (*)    Hemoglobin 11.2 (*)    HCT 33.0 (*)    All other components within normal limits  URINE CULTURE  CBC WITH DIFFERENTIAL/PLATELET  COMPREHENSIVE METABOLIC PANEL  LIPASE, BLOOD    Imaging Review No results found. I have personally reviewed and evaluated these images and lab results as part of my medical decision-making.   EKG Interpretation None      MDM   Final diagnoses:  Weakness    74 yo F with some generalized fatigue at home. Will obtain laboratory evaluation. This problem appears to be chronic is been going on for more than a couple months. UA was obtained and is contaminated. Sent for culture.  Awaiting labs.  Labwork unremarkable.  Patients labs were sent using the wrong patients labels, discussed with patient and does not want to await a redo in the lab.  Will have follow up with her PCP.   I have discussed the diagnosis/risks/treatment options with the patient and family and believe the pt to be  eligible for discharge home to follow-up with PCP. We also discussed returning to the ED immediately if new or worsening sx occur. We discussed the sx which are most concerning (e.g., sudden worsening pain, fever, inability to tolerate by mouth) that necessitate immediate return. Medications administered to the patient during their visit and any new prescriptions provided to the patient are listed below.  Medications given during this visit Medications  sodium chloride 0.9 % bolus 1,000 mL (0 mLs Intravenous Stopped 08/17/15 1400)  ondansetron (ZOFRAN) injection 4 mg (4 mg Intravenous Given 08/17/15 1244)    Discharge Medication List as of 08/17/2015  3:37 PM      The patient appears reasonably screen and/or stabilized for discharge and I doubt any other medical condition or other [facility] requiring further screening, evaluation, or treatment in the ED at this time prior to discharge.     Deno Etienne, DO 08/17/15 1734

## 2015-08-17 NOTE — ED Notes (Signed)
Bed: WA20 Expected date:  Expected time:  Means of arrival:  Comments: Ems- UTI

## 2015-08-17 NOTE — ED Notes (Signed)
Nurse drawing labs. 

## 2015-08-17 NOTE — Progress Notes (Addendum)
   08/17/15 0000  CM Assessment  Expected Discharge [place]  In-house Referral NA  Discharge Planning Services CM Consult  [facility] Home Health  Choice offered to / list presented to  Patient  [facility] Arranged RN;PT;Nurse's [place]  Status of Service Completed, signed off  Discharge Disposition Home w [place] states she had HHP [facility] and this is her choice of agency to use for further home health services Reports her last HHPT was pregnant and worked with her for a month Pt confirms use of walker and states she saw her pcp, [facility] PA at the last appt She used her walker during last pcp appt.  Pt reports living at an independent apt [address] in [place].  Pt reports she was informed at [facility] facility that she had a UTI that caused her to "not be able to walk"   CM reviewed in details medicare guidelines, home health [facility]) (length of stay in home, types of [facility] staff available, coverage, primary caregiver, up to 24 hrs before services may be started) and Private duty nursing (PDN-coverage, length of stay in the home types of staff available). CM reviewed availability of [facility] SW to assist pcp to get pt to snf (if desired disposition) from the community level. CM provided pt/family with a list of [place] home health agencies and PDN.   Pt stated her insurance carrier denied facility placement for her when [facility] staff assisted her recently  Pt mentioned she wanted to see a neurologist  1401 CM called in home health referral to [facility] [place]

## 2015-08-19 LAB — URINE CULTURE

## 2015-08-20 NOTE — Progress Notes (Signed)
ED Antimicrobial Stewardship Positive Culture Follow Up   Jill Bryant is an 74 y.o. female who presented to [facility] on 08/17/2015 with a chief complaint of  Chief Complaint  Patient presents with  . Back Pain    Recent Results (from the past 720 hour(s))  Urine culture     Status: None   Collection Time: 08/17/15 12:06 PM  Result Value Ref Range Status   Specimen Description URINE, CLEAN CATCH  Final   Special Requests NONE  Final   Culture   Final    20,000 COLONIES/mL ESCHERICHIA COLI Performed at [facility]    Report Status 08/19/2015 FINAL  Final   Organism ID, Bacteria ESCHERICHIA COLI  Final      Susceptibility   Escherichia coli - MIC*    AMPICILLIN >=32 RESISTANT Resistant     CEFAZOLIN <=4 SENSITIVE Sensitive     CEFTRIAXONE <=1 SENSITIVE Sensitive     CIPROFLOXACIN >=4 RESISTANT Resistant     GENTAMICIN <=1 SENSITIVE Sensitive     IMIPENEM 1 SENSITIVE Sensitive     NITROFURANTOIN <=16 SENSITIVE Sensitive     TRIMETH/SULFA >=320 RESISTANT Resistant     AMPICILLIN/SULBACTAM >=32 RESISTANT Resistant     PIP/TAZO 16 SENSITIVE Sensitive     * 20,000 COLONIES/mL ESCHERICHIA COLI    Patient's UA shows contamination, no treatment is indicated.  ED Provider: Glendell Docker, NP  Azaya Goedde L. Nicole [facility], PharmD PGY2 Infectious Diseases Pharmacy Resident Pager: [phone] 08/20/2015 10:26 AM

## 2015-09-20 ENCOUNTER — Ambulatory Visit (HOSPITAL_COMMUNITY)
Admission: RE | Admit: 2015-09-20 | Discharge: 2015-09-20 | Disposition: A | Discharge status: Home / Self Care | Payer: Medicare HMO | Source: Ambulatory Visit | Attending: Cardiovascular Disease | Admitting: Cardiovascular Disease

## 2015-09-20 DIAGNOSIS — I10 Essential (primary) hypertension: Secondary | ICD-10-CM | POA: Insufficient documentation

## 2015-09-20 DIAGNOSIS — I6523 Occlusion and stenosis of bilateral carotid arteries: Secondary | ICD-10-CM | POA: Diagnosis not present

## 2015-09-20 DIAGNOSIS — I739 Peripheral vascular disease, unspecified: Secondary | ICD-10-CM

## 2015-09-20 DIAGNOSIS — G458 Other transient cerebral ischemic attacks and related syndromes: Secondary | ICD-10-CM | POA: Diagnosis not present

## 2015-09-20 DIAGNOSIS — I779 Disorder of arteries and arterioles, unspecified: Secondary | ICD-10-CM | POA: Diagnosis not present

## 2015-09-20 DIAGNOSIS — E785 Hyperlipidemia, unspecified: Secondary | ICD-10-CM | POA: Diagnosis not present

## 2016-01-17 ENCOUNTER — Other Ambulatory Visit: Payer: Self-pay | Admitting: Neurosurgery

## 2016-01-19 ENCOUNTER — Telehealth: Payer: Self-pay | Admitting: Internal Medicine

## 2016-01-19 ENCOUNTER — Encounter: Payer: Self-pay | Admitting: Internal Medicine

## 2016-01-19 NOTE — Telephone Encounter (Signed)
Opened in Error.

## 2016-01-19 NOTE — Telephone Encounter (Signed)
Pt would like an appt in the next 2 weeks if possible please. If not,your first available,she needs clearance for surgery.

## 2016-01-20 ENCOUNTER — Telehealth: Payer: Self-pay | Admitting: *Deleted

## 2016-01-20 ENCOUNTER — Encounter: Payer: Self-pay | Admitting: Internal Medicine

## 2016-01-20 NOTE — Telephone Encounter (Signed)
FAXED CLEARANCE TO DR Dominica Severin [facility]- LUMBAR LAMINECTOMY ON 02/27/16   PER DR HILTY, LOW TO INTERMEDIATE RISK , HOLD ASPIRIN 7 DAY PRIOR

## 2016-01-23 ENCOUNTER — Ambulatory Visit (INDEPENDENT_AMBULATORY_CARE_PROVIDER_SITE_OTHER): Payer: Medicare HMO | Admitting: Internal Medicine

## 2016-01-23 ENCOUNTER — Encounter: Payer: Self-pay | Admitting: Internal Medicine

## 2016-01-23 VITALS — BP 124/76 | HR 75 | Ht 68.0 in | Wt 216.6 lb

## 2016-01-23 DIAGNOSIS — Z954 Presence of other heart-valve replacement: Secondary | ICD-10-CM | POA: Diagnosis not present

## 2016-01-23 DIAGNOSIS — Z95828 Presence of other vascular implants and grafts: Secondary | ICD-10-CM | POA: Diagnosis not present

## 2016-01-23 DIAGNOSIS — I2510 Atherosclerotic heart disease of native coronary artery without angina pectoris: Secondary | ICD-10-CM

## 2016-01-23 DIAGNOSIS — Z01810 Encounter for preprocedural cardiovascular examination: Secondary | ICD-10-CM | POA: Diagnosis not present

## 2016-01-23 DIAGNOSIS — Z952 Presence of prosthetic heart valve: Secondary | ICD-10-CM

## 2016-01-23 NOTE — Patient Instructions (Signed)
NO CHANGE IN CURRENT MEDICATIONS   YOU ARE CLEARED FOR SURGERY WITH DR Saintclair Halsted.   Your physician wants you to follow-up in 12 MONTHS WITH DR HILTY. You will receive a reminder letter in the mail two months in advance. If you don't receive a letter, please call our office to schedule the follow-up appointment.  If you need a refill on your cardiac medications before your next appointment, please call your pharmacy.

## 2016-01-23 NOTE — Progress Notes (Signed)
OFFICE NOTE  Chief Complaint:  Preoperative cardiovascular risk assessment  Primary Care Physician: Rodena Medin, MD  HPI:  Jill Bryant is a pleasant 75 year old female with a list of about 40-50 different medical problems. She gets most of her care to the [facility] but due to new changes her insurance is no longer accepted there. She therefore sought out a new cardiologist in [place]. She actually lives in [place] but establish most of [facility] [place] [facility] when she was originally living with family in that area. For a long time she lived in the [place] area. In 2002 she was diagnosed as having PAD and significant aortic occlusion. Ultimately she underwent aortobifem bypass by Dr. Thurnell Lose in 2002. 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 Jerelene Redden in [facility]. She was present followed by Dr. Baxter Hire back in 2013 and also saw Dr. Joylene Grapes. She has significant COPD and is followed by [facility]. She also has obstructive sleep apnea on CPAP and uses oxygen. She tells me she has stage III chronic kidney disease and a solitary kidney. She may also recently had an amaurosis fugax event. Currently she denies any chest pain. She reports stable shortness of breath with exertion. She denies palpitations but as noted have PVCs on her EKG today. She is not on beta blocker presumably due to her significant pulmonary disease and it is listed as an allergy. She told me that she had severe leg weakness previously on metoprolol.  I saw [name] back in the office today. She reports that since we saw each other she's had a couple of episodes of "angina". These episodes included chest discomfort for  which she took 2 nitroglycerin and 3 baby aspirin. The symptoms did not resolve until about an hour later. She described it as a tightness in the center of her chest which radiated up center of her chest to her jaw. She does have a history of reflux and takes a Prilosec but the symptoms are somewhat different. To me, these episodes do not sound like angina. She has had a number of heart catheterizations, both in 2011 and is well as most recently in 2013. Neither study has demonstrated any obstructive coronary disease.  I saw [name] back today in follow-up. She is here for preoperative cardiovascular risk assessment. She is planning on undergoing lumbar spine surgery with Dr. Francesca Jewett. From a cardiac standpoint she's had no recurrent anginal symptoms. Her previous symptoms which were attributable to reflux have improved with reflux treatment. Both of her heart catheterizations in 2011 and 2013 showed no significant obstructive coronary disease. Recent echo last year showed normal function of her bioprosthetic aortic valve. She's also had carotid Dopplers which have been stable. She denies any angina or shortness of breath.  PMHx:  Past Medical History  Diagnosis Date  . Hyperlipidemia   . Hypertension   . UTI (lower urinary tract infection)   . Back pain, chronic     Past Surgical History  Procedure Laterality Date  . Cardiac surgery      valve replacement 2013; aorta replacement 2002  . Hernia repair    . Cholecystectomy    . Lumbar epidural injection      FAMHx:  Family History  Problem Relation Age of Onset  . Other Mother   . Alzheimer's disease Father     SOCHx:   reports that she quit smoking about 12 years ago. She does not have any smokeless tobacco history on file. She reports that she does not drink alcohol. Her drug history is not on file.  ALLERGIES:  Allergies  Allergen Reactions  . Amlodipine Swelling    Feet/toes swelling  . Metoprolol Other (See Comments)     Bradycardia and fatigue  . Morphine And Related Other (See Comments)    Hallucinations/seizures  . Tape Rash    ROS: Pertinent items noted in HPI and remainder of comprehensive ROS otherwise negative.  HOME MEDS: Current Outpatient Prescriptions  Medication Sig Dispense Refill  . acetaminophen (TYLENOL) 500 MG tablet Take 1,000 mg by mouth every 6 (six) hours as needed for moderate pain.    Marland Kitchen albuterol (ACCUNEB) 0.63 MG/3ML nebulizer solution Take 1 ampule by nebulization 2 (two) times daily.    Marland Kitchen albuterol (PROVENTIL HFA;VENTOLIN HFA) 108 (90 BASE) MCG/ACT inhaler Inhale into the lungs as needed.    Marland Kitchen aspirin EC 81 MG tablet Take 81 mg by mouth daily.    . cephALEXin (KEFLEX) 250 MG capsule Take 250 mg by mouth daily.    . cetirizine (ZYRTEC) 10 MG tablet Take 10 mg by mouth daily.    . diazepam (VALIUM) 5 MG tablet Take 5 mg by mouth every 6 (six) hours as needed for sedation.     . fexofenadine (ALLEGRA) 180 MG tablet Take 180 mg by mouth daily.    Marland Kitchen FLUoxetine (PROZAC) 20 MG capsule Take 1 capsule by mouth daily.    . furosemide (LASIX) 40 MG tablet Take 40 mg by mouth 2 (two) times daily.     Marland Kitchen gabapentin (NEURONTIN) 300 MG capsule Take 600 mg by mouth at bedtime.     Marland Kitchen HYDROcodone-acetaminophen (NORCO/VICODIN) 5-325 MG per tablet Take 1 tablet by mouth every 6 (six) hours as needed for moderate pain.     Marland Kitchen levothyroxine (SYNTHROID, LEVOTHROID) 112 MCG tablet Take 1 tablet by mouth daily.    Marland Kitchen lovastatin (MEVACOR) 40 MG tablet Take 40 mg by mouth at bedtime.    . nitroGLYCERIN (NITROSTAT) 0.4 MG SL tablet Place 1 tablet (0.4 mg total) under the tongue every 5 (five) minutes. 25 tablet 3  . Omega-3 1000 MG CAPS Take 1 capsule by mouth daily.    Marland Kitchen omeprazole (PRILOSEC) 20 MG capsule Take 20 mg by mouth daily.     . OXYGEN Inhale 2-4 L into the lungs at bedtime.    Marland Kitchen SPIRIVA HANDIHALER 18 MCG inhalation capsule daily.    Marland Kitchen telmisartan (MICARDIS) 20 MG tablet Take 20 mg by mouth  daily.     No current facility-administered medications for this visit.    LABS/IMAGING: No results found for this or any previous visit (from the past 48 hour(s)). No results found.  WEIGHTS: Wt Readings from Last 3 Encounters:  01/23/16 216 lb 9.6 oz (98.249 kg)  04/27/15 229 lb 1.6 oz (103.919 kg)  02/23/15 232 lb 8 oz (105.461 kg)    VITALS: BP 124/76 mmHg  Pulse 75  Ht '5\' 8"'$  (1.727 m)  Wt 216 lb 9.6 oz (98.249 kg)  BMI 32.94 kg/m2  EXAM: General appearance: alert and no distress Neck: no carotid bruit and no JVD Lungs: diminished breath sounds bilaterally Heart: regular rate and rhythm and S1, S2 normal Abdomen: soft, non-tender; bowel sounds normal; no  masses,  no organomegaly Extremities: extremities normal, atraumatic, no cyanosis or edema Pulses: 2+ and symmetric Skin: Skin color, texture, turgor normal. No rashes or lesions Neurologic: Grossly normal PSych: Pleasant  EKG: Normal sinus rhythm at 75, nonspecific ST and T changes  ASSESSMENT: 1. Low risk for upcoming back surgery 2. 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 Status post aortobifem bypass in 2002 5. PVCs 6. Hypertension 7. Dyslipidemia 8. COPD on oxygen 8.   Obstructive sleep apnea on CPAP 9.   Carotid artery stenosis bilaterally 10. GERD  PLAN: 1.  Mrs. Frommer is at low risk for upcoming lumbar spine surgery. Her carotid Dopplers are stable. Echo last year shows normal valve gradients. She may need to hold aspirin for 7-10 days prior to that procedure at Dr. Windy Carina request - that's okay with me. Follow-up with me annually or sooner as necessary.  Pixie Casino, MD, [facility] Attending Cardiologist [place] [facility] 01/23/2016, 10:04 AM

## 2016-02-21 ENCOUNTER — Encounter (HOSPITAL_COMMUNITY): Payer: Self-pay

## 2016-02-21 ENCOUNTER — Encounter (HOSPITAL_COMMUNITY)
Admission: RE | Admit: 2016-02-21 | Discharge: 2016-02-21 | Disposition: A | Discharge status: Home / Self Care | Payer: Medicare HMO | Source: Ambulatory Visit | Attending: Neurosurgery | Admitting: Neurosurgery

## 2016-02-21 DIAGNOSIS — K219 Gastro-esophageal reflux disease without esophagitis: Secondary | ICD-10-CM | POA: Insufficient documentation

## 2016-02-21 DIAGNOSIS — Z79899 Other long term (current) drug therapy: Secondary | ICD-10-CM | POA: Insufficient documentation

## 2016-02-21 DIAGNOSIS — Z01818 Encounter for other preprocedural examination: Secondary | ICD-10-CM | POA: Insufficient documentation

## 2016-02-21 DIAGNOSIS — Z87891 Personal history of nicotine dependence: Secondary | ICD-10-CM | POA: Diagnosis not present

## 2016-02-21 DIAGNOSIS — Z8673 Personal history of transient ischemic attack (TIA), and cerebral infarction without residual deficits: Secondary | ICD-10-CM | POA: Diagnosis not present

## 2016-02-21 DIAGNOSIS — M4806 Spinal stenosis, lumbar region: Secondary | ICD-10-CM | POA: Diagnosis not present

## 2016-02-21 DIAGNOSIS — I509 Heart failure, unspecified: Secondary | ICD-10-CM | POA: Diagnosis not present

## 2016-02-21 DIAGNOSIS — J449 Chronic obstructive pulmonary disease, unspecified: Secondary | ICD-10-CM | POA: Insufficient documentation

## 2016-02-21 DIAGNOSIS — Z01812 Encounter for preprocedural laboratory examination: Secondary | ICD-10-CM | POA: Insufficient documentation

## 2016-02-21 DIAGNOSIS — Z953 Presence of xenogenic heart valve: Secondary | ICD-10-CM | POA: Insufficient documentation

## 2016-02-21 DIAGNOSIS — E039 Hypothyroidism, unspecified: Secondary | ICD-10-CM | POA: Insufficient documentation

## 2016-02-21 DIAGNOSIS — E785 Hyperlipidemia, unspecified: Secondary | ICD-10-CM | POA: Diagnosis not present

## 2016-02-21 DIAGNOSIS — I110 Hypertensive heart disease with heart failure: Secondary | ICD-10-CM | POA: Insufficient documentation

## 2016-02-21 DIAGNOSIS — G4733 Obstructive sleep apnea (adult) (pediatric): Secondary | ICD-10-CM | POA: Insufficient documentation

## 2016-02-21 DIAGNOSIS — Z7982 Long term (current) use of aspirin: Secondary | ICD-10-CM | POA: Insufficient documentation

## 2016-02-21 HISTORY — DX: Sleep apnea, unspecified: G47.30

## 2016-02-21 HISTORY — DX: Depression, unspecified: F32.A

## 2016-02-21 HISTORY — DX: Headache, unspecified: R51.9

## 2016-02-21 HISTORY — DX: Unspecified osteoarthritis, unspecified site: M19.90

## 2016-02-21 HISTORY — DX: Gastro-esophageal reflux disease without esophagitis: K21.9

## 2016-02-21 HISTORY — DX: Presence of spectacles and contact lenses: Z97.3

## 2016-02-21 HISTORY — DX: Atrophy of kidney (terminal): N26.1

## 2016-02-21 HISTORY — DX: Transient cerebral ischemic attack, unspecified: G45.9

## 2016-02-21 HISTORY — DX: Anxiety disorder, unspecified: F41.9

## 2016-02-21 HISTORY — DX: Heart failure, unspecified: I50.9

## 2016-02-21 HISTORY — DX: Chronic obstructive pulmonary disease, unspecified: J44.9

## 2016-02-21 HISTORY — DX: Anemia, unspecified: D64.9

## 2016-02-21 HISTORY — DX: Pneumonia, unspecified organism: J18.9

## 2016-02-21 HISTORY — DX: Horseshoe tear of retina without detachment, unspecified eye: H33.319

## 2016-02-21 HISTORY — DX: Spinal stenosis, lumbar region without neurogenic claudication: M48.061

## 2016-02-21 HISTORY — DX: Major depressive disorder, single episode, unspecified: F32.9

## 2016-02-21 HISTORY — DX: Other specified postprocedural states: R11.2

## 2016-02-21 HISTORY — DX: Unspecified convulsions: R56.9

## 2016-02-21 HISTORY — DX: Hypothyroidism, unspecified: E03.9

## 2016-02-21 HISTORY — DX: Headache: R51

## 2016-02-21 HISTORY — DX: Unspecified asthma, uncomplicated: J45.909

## 2016-02-21 HISTORY — DX: Reserved for inherently not codable concepts without codable children: IMO0001

## 2016-02-21 HISTORY — DX: Other specified postprocedural states: Z98.890

## 2016-02-21 LAB — BASIC METABOLIC PANEL
ANION GAP: 10 (ref 5–15)
BUN: 15 mg/dL (ref 6–20)
CHLORIDE: 99 mmol/L — AB (ref 101–111)
CO2: 28 mmol/L (ref 22–32)
CREATININE: 1.4 mg/dL — AB (ref 0.44–1.00)
Calcium: 9.3 mg/dL (ref 8.9–10.3)
GFR calc non Af Amer: 35 mL/min — ABNORMAL LOW (ref >60)
GFR, EST AFRICAN AMERICAN: 41 mL/min — AB (ref >60)
GLUCOSE: 101 mg/dL — AB (ref 65–99)
POTASSIUM: 4.3 mmol/L (ref 3.5–5.1)
Sodium: 137 mmol/L (ref 135–145)

## 2016-02-21 LAB — CBC
HCT: 39.4 % (ref 36.0–46.0)
Hemoglobin: 12.4 g/dL (ref 12.0–15.0)
MCH: 27.3 pg (ref 26.0–34.0)
MCHC: 31.5 g/dL (ref 30.0–36.0)
MCV: 86.8 fL (ref 78.0–100.0)
PLATELETS: 203 10*3/uL (ref 150–400)
RBC: 4.54 MIL/uL (ref 3.87–5.11)
RDW: 15 % (ref 11.5–15.5)
WBC: 8.3 10*3/uL (ref 4.0–10.5)

## 2016-02-21 LAB — SURGICAL PCR SCREEN
MRSA, PCR: NEGATIVE
STAPHYLOCOCCUS AUREUS: NEGATIVE

## 2016-02-21 NOTE — Progress Notes (Signed)
Pt stated that her last dose of Aspirin was Sunday.

## 2016-02-21 NOTE — Progress Notes (Signed)
Pt stated that she gets SOB with exertion but denies chest pain. Pt stated that she is under the care of Dr. Debara Pickett, Cardiology. Pt stated that she had a stress test completed at [facility] in [place], [place] and a cardiac cath and echo at [facility]; records requested from both. Pt chart forwarded to anesthesia to review history and cardiac clearance note on chart.

## 2016-02-21 NOTE — Pre-Procedure Instructions (Signed)
Jill Bryant  02/21/2016      [facility] - [place], [place] - [address] [place] [address] Phone: [phone] Fax: [phone]    Your procedure is scheduled on Monday, Feb 27, 2016  Report to [facility] Admitting at 7:30 A.M.  Call this number if you have problems the morning of surgery:  [phone]   Remember:  Do not eat food or drink liquids after midnight Sunday, February 26, 2016  Take these medicines the morning of surgery with A SIP OF WATER: FLUoxetine (PROZAC), levothyroxine (SYNTHROID, LEVOTHROID), omeprazole (PRILOSEC), SPIRIVA HANDIHALER inhalation, albuterol (ACCUNEB) 0.63 MG/3ML nebulizer solution if needed: pain medication ( Tylenol or Hydrocodone ), cetirizine (ZYRTEC) for allergies, nitroGLYCERIN for chest pain, albuterol (PROVENTIL HFA;VENTOLIN HFA) inhaler ( bring inhaler in with you on day of procedure). Stop taking Aspirin, vitamins, fish oil, and herbal medications. Do not take any NSAIDs ie: Ibuprofen, Advil, Naproxen, BC's and Goody Powder or any medication containing Aspirin; stop now.  Do not wear jewelry, make-up or nail polish.  Do not wear lotions, powders, or perfumes.  You may not wear deodorant.  Do not shave 48 hours prior to surgery.    Do not bring valuables to the hospital.  [facility] is not responsible for any belongings or valuables.  Contacts, dentures or bridgework may not be worn into surgery.  Leave your suitcase in the car.  After surgery it may be brought to your room.  For patients admitted to the hospital, discharge time will be determined by your treatment team.  Patients discharged the day of surgery will not be allowed to drive home.   Name and phone number of your driver:    Special instructions: [facility] - Preparing for Surgery  Before surgery, you can play an important role.  Because skin is not sterile, your skin needs to be as free of germs as possible.   You can reduce the number of germs on you skin by washing with CHG (chlorahexidine gluconate) soap before surgery.  CHG is an antiseptic cleaner which kills germs and bonds with the skin to continue killing germs even after washing.  Please DO NOT use if you have an allergy to CHG or antibacterial soaps.  If your skin becomes reddened/irritated stop using the CHG and inform your nurse when you arrive at Short Stay.  Do not shave (including legs and underarms) for at least 48 hours prior to the first CHG shower.  You may shave your face.  Please follow these instructions carefully:   1.  Shower with CHG Soap the night before surgery and the morning of Surgery.  2.  If you choose to wash your hair, wash your hair first as usual with your normal shampoo.  3.  After you shampoo, rinse your hair and body thoroughly to remove the Shampoo.  4.  Use CHG as you would any other liquid soap.  You can apply chg directly  to the skin and wash gently with scrungie or a clean washcloth.  5.  Apply the CHG Soap to your body ONLY FROM THE NECK DOWN.  Do not use on open wounds or open sores.  Avoid contact with your eyes, ears, mouth and genitals (private parts).  Wash genitals (private parts) with your normal soap.  6.  Wash thoroughly, paying special attention to the area where your surgery will be performed.  7.  Thoroughly rinse your body with warm water from the neck  down.  8.  DO NOT shower/wash with your normal soap after using and rinsing off the CHG Soap.  9.  Pat yourself dry with a clean towel.            10.  Wear clean pajamas.            11.  Place clean sheets on your bed the night of your first shower and do not sleep with pets.  Day of Surgery  Do not apply any lotions/deodorants the morning of surgery.  Please wear clean clothes to the hospital/surgery center.  Please read over the following fact sheets that you were given. Pain Booklet, Coughing and Deep Breathing, MRSA Information and  Surgical Site Infection Prevention

## 2016-02-22 NOTE — Progress Notes (Signed)
Anesthesia Chart Review:  Pt is a 75 year old female scheduled for L3-4, L4-5 laminectomy and foraminotomy, L3-4 left discectomy on 02/27/2016 with Dr. Saintclair Halsted.   Cardiologist is Dr. Lyman Bishop who has cleared pt for surgery.   PMH includes:  HTN, CHF, aortic stenosis (s/p AV replacement 2013 at [facility]), hyperlipidemia, COPD, anemia, TIA, OSA, asthma, hypothyroidism, seizures (with morphine), DOE with exertion, post-op N/V, GERD. Former smoker. BMI 33  Medications include: albuterol, ASA, lasix, levothyroxine, prilosec, spiriva, telmisartan. Last ASA dose was 02/19/16.   Preoperative labs reviewed.   EKG 01/23/16:  NSR. Low voltage QRS. Nonspecific ST and T wave abnormality.   Carotid duplex 09/20/15:  - Stable 1-39% RICA stenosis. - Stable 37-29% LICA stenosis.  Echo 02/16/15:  - Left ventricle: The cavity size was normal. Wall thickness was normal. Systolic function was normal. The estimated ejection fraction was in the range of 55% to 60%. Wall motion was normal; there were no regional wall motion abnormalities. Left ventricular diastolic function parameters were normal. - Aortic valve: A bioprosthesis was present and functioning normally. Valve area (VTI): 1.84 cm^2. Valve area (Vmax): 1.52cm^2. - Left atrium: The atrium was moderately dilated. - Pulmonary arteries: Systolic pressure was mildly increased. PA peak pressure: 47 mm Hg (S).  Cardiac cath 03/28/12 ([facility]):  - Normal coronary arteries - Normal R heart pressures - Unable to cross heavily calcified AV. Severe aortic stenosis  If no changes, I anticipate pt can proceed with surgery as scheduled.   Willeen Cass, FNP-BC [facility] Short Stay Surgical Center/Anesthesiology Phone: [phone] 02/22/2016 9:01 AM

## 2016-02-26 MED ORDER — CEFAZOLIN SODIUM-DEXTROSE 2-4 GM/100ML-% IV SOLN
2.0000 g | INTRAVENOUS | Status: AC
  Administered 2016-02-27: 2 g via INTRAVENOUS
  Filled 2016-02-26: qty 100

## 2016-02-26 MED ORDER — DEXAMETHASONE SODIUM PHOSPHATE 10 MG/ML IJ SOLN
10.0000 mg | INTRAMUSCULAR | Status: AC
  Administered 2016-02-27: 10 mg via INTRAVENOUS
  Filled 2016-02-26: qty 1

## 2016-02-27 ENCOUNTER — Encounter (HOSPITAL_COMMUNITY): Payer: Self-pay | Admitting: Certified Registered Nurse Anesthetist

## 2016-02-27 ENCOUNTER — Inpatient Hospital Stay (HOSPITAL_COMMUNITY): Payer: Medicare HMO

## 2016-02-27 ENCOUNTER — Inpatient Hospital Stay (HOSPITAL_COMMUNITY): Payer: Medicare HMO | Admitting: Certified Registered Nurse Anesthetist

## 2016-02-27 ENCOUNTER — Inpatient Hospital Stay (HOSPITAL_COMMUNITY): Payer: Medicare HMO | Admitting: Emergency Medicine

## 2016-02-27 ENCOUNTER — Encounter (HOSPITAL_COMMUNITY)
Admission: RE | Disposition: A | Discharge status: Home / Self Care | Payer: Self-pay | Source: Ambulatory Visit | Attending: Neurosurgery

## 2016-02-27 ENCOUNTER — Inpatient Hospital Stay (HOSPITAL_COMMUNITY)
Admission: RE | Admit: 2016-02-27 | Discharge: 2016-02-28 | DRG: 520 | Disposition: A | Discharge status: Home / Self Care | Payer: Medicare HMO | Source: Ambulatory Visit | Attending: Neurosurgery | Admitting: Neurosurgery

## 2016-02-27 DIAGNOSIS — M47816 Spondylosis without myelopathy or radiculopathy, lumbar region: Secondary | ICD-10-CM | POA: Diagnosis present

## 2016-02-27 DIAGNOSIS — Z419 Encounter for procedure for purposes other than remedying health state, unspecified: Secondary | ICD-10-CM

## 2016-02-27 DIAGNOSIS — F419 Anxiety disorder, unspecified: Secondary | ICD-10-CM | POA: Diagnosis present

## 2016-02-27 DIAGNOSIS — Z87891 Personal history of nicotine dependence: Secondary | ICD-10-CM | POA: Diagnosis not present

## 2016-02-27 DIAGNOSIS — F329 Major depressive disorder, single episode, unspecified: Secondary | ICD-10-CM | POA: Diagnosis present

## 2016-02-27 DIAGNOSIS — I110 Hypertensive heart disease with heart failure: Secondary | ICD-10-CM | POA: Diagnosis present

## 2016-02-27 DIAGNOSIS — Z9841 Cataract extraction status, right eye: Secondary | ICD-10-CM | POA: Diagnosis not present

## 2016-02-27 DIAGNOSIS — G4730 Sleep apnea, unspecified: Secondary | ICD-10-CM | POA: Diagnosis present

## 2016-02-27 DIAGNOSIS — Z9842 Cataract extraction status, left eye: Secondary | ICD-10-CM | POA: Diagnosis not present

## 2016-02-27 DIAGNOSIS — N289 Disorder of kidney and ureter, unspecified: Secondary | ICD-10-CM | POA: Diagnosis present

## 2016-02-27 DIAGNOSIS — Z8673 Personal history of transient ischemic attack (TIA), and cerebral infarction without residual deficits: Secondary | ICD-10-CM | POA: Diagnosis not present

## 2016-02-27 DIAGNOSIS — I509 Heart failure, unspecified: Secondary | ICD-10-CM | POA: Diagnosis present

## 2016-02-27 DIAGNOSIS — K219 Gastro-esophageal reflux disease without esophagitis: Secondary | ICD-10-CM | POA: Diagnosis present

## 2016-02-27 DIAGNOSIS — Z952 Presence of prosthetic heart valve: Secondary | ICD-10-CM

## 2016-02-27 DIAGNOSIS — J449 Chronic obstructive pulmonary disease, unspecified: Secondary | ICD-10-CM | POA: Diagnosis present

## 2016-02-27 DIAGNOSIS — Z961 Presence of intraocular lens: Secondary | ICD-10-CM | POA: Diagnosis present

## 2016-02-27 DIAGNOSIS — M4806 Spinal stenosis, lumbar region: Secondary | ICD-10-CM | POA: Diagnosis present

## 2016-02-27 DIAGNOSIS — E039 Hypothyroidism, unspecified: Secondary | ICD-10-CM | POA: Diagnosis present

## 2016-02-27 DIAGNOSIS — I739 Peripheral vascular disease, unspecified: Secondary | ICD-10-CM | POA: Diagnosis present

## 2016-02-27 DIAGNOSIS — M5126 Other intervertebral disc displacement, lumbar region: Secondary | ICD-10-CM | POA: Diagnosis present

## 2016-02-27 DIAGNOSIS — E785 Hyperlipidemia, unspecified: Secondary | ICD-10-CM | POA: Diagnosis present

## 2016-02-27 DIAGNOSIS — M79605 Pain in left leg: Secondary | ICD-10-CM | POA: Diagnosis present

## 2016-02-27 HISTORY — PX: LUMBAR LAMINECTOMY/DECOMPRESSION MICRODISCECTOMY: SHX5026

## 2016-02-27 SURGERY — LUMBAR LAMINECTOMY/DECOMPRESSION MICRODISCECTOMY 2 LEVELS
Anesthesia: General | Site: Back | Laterality: Left

## 2016-02-27 MED ORDER — PHENOL 1.4 % MT LIQD: 1.0000 | OROMUCOSAL | Status: DC | PRN

## 2016-02-27 MED ORDER — LACTATED RINGERS IV SOLN: INTRAVENOUS | Status: DC

## 2016-02-27 MED ORDER — ONDANSETRON HCL 4 MG/2ML IJ SOLN
INTRAMUSCULAR | Status: AC
  Filled 2016-02-27: qty 2

## 2016-02-27 MED ORDER — OMEGA-3 1000 MG PO CAPS: 1.0000 | ORAL_CAPSULE | Freq: Every day | ORAL | Status: DC

## 2016-02-27 MED ORDER — LACTATED RINGERS IV SOLN
INTRAVENOUS | Status: DC
  Administered 2016-02-27 (×3): via INTRAVENOUS

## 2016-02-27 MED ORDER — MIDAZOLAM HCL 5 MG/5ML IJ SOLN
INTRAMUSCULAR | Status: DC | PRN
  Administered 2016-02-27: 2 mg via INTRAVENOUS

## 2016-02-27 MED ORDER — TIOTROPIUM BROMIDE MONOHYDRATE 18 MCG IN CAPS
18.0000 ug | ORAL_CAPSULE | Freq: Every day | RESPIRATORY_TRACT | Status: DC
  Administered 2016-02-28: 18 ug via RESPIRATORY_TRACT
  Filled 2016-02-27: qty 5

## 2016-02-27 MED ORDER — NITROGLYCERIN 0.4 MG SL SUBL: 0.4000 mg | SUBLINGUAL_TABLET | SUBLINGUAL | Status: DC

## 2016-02-27 MED ORDER — LIDOCAINE HCL (CARDIAC) 20 MG/ML IV SOLN
INTRAVENOUS | Status: DC | PRN
  Administered 2016-02-27: 100 mg via INTRAVENOUS

## 2016-02-27 MED ORDER — FLUOXETINE HCL 20 MG PO CAPS: 20.0000 mg | ORAL_CAPSULE | Freq: Every day | ORAL | Status: DC

## 2016-02-27 MED ORDER — PROPOFOL 10 MG/ML IV BOLUS
INTRAVENOUS | Status: AC
  Filled 2016-02-27: qty 20

## 2016-02-27 MED ORDER — SODIUM CHLORIDE 0.9 % IV SOLN: 250.0000 mL | INTRAVENOUS | Status: DC

## 2016-02-27 MED ORDER — OMEGA-3-ACID ETHYL ESTERS 1 G PO CAPS
1.0000 g | ORAL_CAPSULE | Freq: Every day | ORAL | Status: DC
  Filled 2016-02-27: qty 1

## 2016-02-27 MED ORDER — THROMBIN 5000 UNITS EX SOLR
CUTANEOUS | Status: DC | PRN
  Administered 2016-02-27 (×2): 5000 [IU] via TOPICAL

## 2016-02-27 MED ORDER — SODIUM CHLORIDE 0.9% FLUSH: 3.0000 mL | INTRAVENOUS | Status: DC | PRN

## 2016-02-27 MED ORDER — MEPERIDINE HCL 25 MG/ML IJ SOLN: 6.2500 mg | INTRAMUSCULAR | Status: DC | PRN

## 2016-02-27 MED ORDER — ONDANSETRON HCL 4 MG/2ML IJ SOLN
INTRAMUSCULAR | Status: DC | PRN
  Administered 2016-02-27: 4 mg via INTRAVENOUS

## 2016-02-27 MED ORDER — LEVOTHYROXINE SODIUM 112 MCG PO TABS
112.0000 ug | ORAL_TABLET | Freq: Every day | ORAL | Status: DC
  Administered 2016-02-28: 112 ug via ORAL
  Filled 2016-02-27: qty 1

## 2016-02-27 MED ORDER — OXYCODONE-ACETAMINOPHEN 5-325 MG PO TABS
1.0000 | ORAL_TABLET | ORAL | Status: DC | PRN
  Administered 2016-02-27: 2 via ORAL
  Administered 2016-02-27 – 2016-02-28 (×2): 1 via ORAL
  Filled 2016-02-27 (×2): qty 1
  Filled 2016-02-27: qty 2

## 2016-02-27 MED ORDER — ACETAMINOPHEN 650 MG RE SUPP: 650.0000 mg | RECTAL | Status: DC | PRN

## 2016-02-27 MED ORDER — ROCURONIUM BROMIDE 50 MG/5ML IV SOLN
INTRAVENOUS | Status: AC
  Filled 2016-02-27: qty 1

## 2016-02-27 MED ORDER — HEMOSTATIC AGENTS (NO CHARGE) OPTIME
TOPICAL | Status: DC | PRN
  Administered 2016-02-27: 1 via TOPICAL

## 2016-02-27 MED ORDER — HYDROCODONE-ACETAMINOPHEN 5-325 MG PO TABS: 1.0000 | ORAL_TABLET | Freq: Four times a day (QID) | ORAL | Status: DC | PRN

## 2016-02-27 MED ORDER — ONDANSETRON HCL 4 MG/2ML IJ SOLN: 4.0000 mg | INTRAMUSCULAR | Status: DC | PRN

## 2016-02-27 MED ORDER — GABAPENTIN 300 MG PO CAPS
600.0000 mg | ORAL_CAPSULE | Freq: Every day | ORAL | Status: DC
  Administered 2016-02-27: 600 mg via ORAL
  Filled 2016-02-27: qty 2

## 2016-02-27 MED ORDER — SODIUM CHLORIDE 0.9% FLUSH
3.0000 mL | Freq: Two times a day (BID) | INTRAVENOUS | Status: DC
  Administered 2016-02-27 (×2): 3 mL via INTRAVENOUS

## 2016-02-27 MED ORDER — ALBUTEROL SULFATE (2.5 MG/3ML) 0.083% IN NEBU: 3.0000 mL | INHALATION_SOLUTION | RESPIRATORY_TRACT | Status: DC | PRN

## 2016-02-27 MED ORDER — FENTANYL CITRATE (PF) 250 MCG/5ML IJ SOLN
INTRAMUSCULAR | Status: AC
  Filled 2016-02-27: qty 5

## 2016-02-27 MED ORDER — PHENYLEPHRINE 40 MCG/ML (10ML) SYRINGE FOR IV PUSH (FOR BLOOD PRESSURE SUPPORT)
PREFILLED_SYRINGE | INTRAVENOUS | Status: AC
  Filled 2016-02-27: qty 10

## 2016-02-27 MED ORDER — FENTANYL CITRATE (PF) 100 MCG/2ML IJ SOLN
INTRAMUSCULAR | Status: AC
  Filled 2016-02-27: qty 2

## 2016-02-27 MED ORDER — LORATADINE 10 MG PO TABS: 10.0000 mg | ORAL_TABLET | Freq: Every day | ORAL | Status: DC

## 2016-02-27 MED ORDER — ROCURONIUM BROMIDE 100 MG/10ML IV SOLN
INTRAVENOUS | Status: DC | PRN
  Administered 2016-02-27: 50 mg via INTRAVENOUS

## 2016-02-27 MED ORDER — CEFAZOLIN SODIUM 1-5 GM-% IV SOLN
1.0000 g | Freq: Three times a day (TID) | INTRAVENOUS | Status: AC
  Administered 2016-02-27 (×2): 1 g via INTRAVENOUS
  Filled 2016-02-27 (×2): qty 50

## 2016-02-27 MED ORDER — CYCLOBENZAPRINE HCL 10 MG PO TABS
10.0000 mg | ORAL_TABLET | Freq: Three times a day (TID) | ORAL | Status: DC | PRN
  Administered 2016-02-27 (×2): 10 mg via ORAL
  Filled 2016-02-27: qty 1

## 2016-02-27 MED ORDER — EPHEDRINE 5 MG/ML INJ
INTRAVENOUS | Status: AC
  Filled 2016-02-27: qty 10

## 2016-02-27 MED ORDER — NITROGLYCERIN 0.4 MG SL SUBL: 0.4000 mg | SUBLINGUAL_TABLET | SUBLINGUAL | Status: DC | PRN

## 2016-02-27 MED ORDER — METOCLOPRAMIDE HCL 5 MG/ML IJ SOLN
10.0000 mg | Freq: Once | INTRAMUSCULAR | Status: DC | PRN
Start: 2016-02-27 — End: 2016-02-27

## 2016-02-27 MED ORDER — FUROSEMIDE 40 MG PO TABS: 40.0000 mg | ORAL_TABLET | Freq: Two times a day (BID) | ORAL | Status: DC

## 2016-02-27 MED ORDER — FENTANYL CITRATE (PF) 100 MCG/2ML IJ SOLN
INTRAMUSCULAR | Status: DC | PRN
  Administered 2016-02-27: 100 ug via INTRAVENOUS
  Administered 2016-02-27: 50 ug via INTRAVENOUS

## 2016-02-27 MED ORDER — ALBUTEROL SULFATE (2.5 MG/3ML) 0.083% IN NEBU
3.0000 mL | INHALATION_SOLUTION | Freq: Two times a day (BID) | RESPIRATORY_TRACT | Status: DC
  Administered 2016-02-27 – 2016-02-28 (×2): 3 mL via RESPIRATORY_TRACT
  Filled 2016-02-27 (×2): qty 3

## 2016-02-27 MED ORDER — SODIUM CHLORIDE 0.9 % IR SOLN
Status: DC | PRN
  Administered 2016-02-27: 09:00:00

## 2016-02-27 MED ORDER — ASPIRIN EC 81 MG PO TBEC
81.0000 mg | DELAYED_RELEASE_TABLET | Freq: Every day | ORAL | Status: DC
Start: 2016-02-28 — End: 2016-02-28

## 2016-02-27 MED ORDER — SUGAMMADEX SODIUM 200 MG/2ML IV SOLN
INTRAVENOUS | Status: AC
  Filled 2016-02-27: qty 2

## 2016-02-27 MED ORDER — IRBESARTAN 75 MG PO TABS
37.5000 mg | ORAL_TABLET | Freq: Every day | ORAL | Status: DC
  Filled 2016-02-27: qty 0.5

## 2016-02-27 MED ORDER — SUGAMMADEX SODIUM 200 MG/2ML IV SOLN
INTRAVENOUS | Status: DC | PRN
  Administered 2016-02-27: 200 mg via INTRAVENOUS

## 2016-02-27 MED ORDER — DIAZEPAM 5 MG PO TABS: 5.0000 mg | ORAL_TABLET | Freq: Four times a day (QID) | ORAL | Status: DC | PRN

## 2016-02-27 MED ORDER — EPHEDRINE SULFATE 50 MG/ML IJ SOLN
INTRAMUSCULAR | Status: DC | PRN
  Administered 2016-02-27 (×2): 5 mg via INTRAVENOUS

## 2016-02-27 MED ORDER — CYCLOBENZAPRINE HCL 10 MG PO TABS
ORAL_TABLET | ORAL | Status: AC
  Filled 2016-02-27: qty 1

## 2016-02-27 MED ORDER — 0.9 % SODIUM CHLORIDE (POUR BTL) OPTIME
TOPICAL | Status: DC | PRN
  Administered 2016-02-27: 1000 mL

## 2016-02-27 MED ORDER — ACETAMINOPHEN 500 MG PO TABS: 1000.0000 mg | ORAL_TABLET | Freq: Four times a day (QID) | ORAL | Status: DC | PRN

## 2016-02-27 MED ORDER — PHENYLEPHRINE HCL 10 MG/ML IJ SOLN
INTRAMUSCULAR | Status: DC | PRN
  Administered 2016-02-27: 80 ug via INTRAVENOUS
  Administered 2016-02-27: 40 ug via INTRAVENOUS
  Administered 2016-02-27: 120 ug via INTRAVENOUS
  Administered 2016-02-27 (×2): 80 ug via INTRAVENOUS

## 2016-02-27 MED ORDER — BUPIVACAINE HCL (PF) 0.25 % IJ SOLN
INTRAMUSCULAR | Status: DC | PRN
  Administered 2016-02-27: 10 mL

## 2016-02-27 MED ORDER — FENTANYL CITRATE (PF) 100 MCG/2ML IJ SOLN
25.0000 ug | INTRAMUSCULAR | Status: DC | PRN
  Administered 2016-02-27: 25 ug via INTRAVENOUS
  Administered 2016-02-27: 50 ug via INTRAVENOUS

## 2016-02-27 MED ORDER — LIDOCAINE 2% (20 MG/ML) 5 ML SYRINGE
INTRAMUSCULAR | Status: AC
  Filled 2016-02-27: qty 5

## 2016-02-27 MED ORDER — MENTHOL 3 MG MT LOZG: 1.0000 | LOZENGE | OROMUCOSAL | Status: DC | PRN

## 2016-02-27 MED ORDER — PROPOFOL 10 MG/ML IV BOLUS
INTRAVENOUS | Status: DC | PRN
  Administered 2016-02-27: 140 mg via INTRAVENOUS
  Administered 2016-02-27 (×2): 10 mg via INTRAVENOUS

## 2016-02-27 MED ORDER — HYDROMORPHONE HCL 1 MG/ML IJ SOLN: 0.5000 mg | INTRAMUSCULAR | Status: DC | PRN

## 2016-02-27 MED ORDER — ACETAMINOPHEN 325 MG PO TABS: 650.0000 mg | ORAL_TABLET | ORAL | Status: DC | PRN

## 2016-02-27 MED ORDER — LIDOCAINE-EPINEPHRINE 1 %-1:100000 IJ SOLN
INTRAMUSCULAR | Status: DC | PRN
Start: 2016-02-27 — End: 2016-02-27
  Administered 2016-02-27: 10 mL

## 2016-02-27 MED ORDER — PANTOPRAZOLE SODIUM 40 MG PO TBEC: 40.0000 mg | DELAYED_RELEASE_TABLET | Freq: Every day | ORAL | Status: DC

## 2016-02-27 MED ORDER — MIDAZOLAM HCL 2 MG/2ML IJ SOLN
INTRAMUSCULAR | Status: AC
  Filled 2016-02-27: qty 2

## 2016-02-27 SURGICAL SUPPLY — 55 items
BAG DECANTER FOR FLEXI CONT (MISCELLANEOUS) ×3 IMPLANT
BENZOIN TINCTURE PRP APPL 2/3 (GAUZE/BANDAGES/DRESSINGS) ×3 IMPLANT
BLADE CLIPPER SURG (BLADE) IMPLANT
BLADE SURG 11 STRL SS (BLADE) ×3 IMPLANT
BRUSH SCRUB EZ PLAIN DRY (MISCELLANEOUS) ×3 IMPLANT
BUR MATCHSTICK NEURO 3.0 LAGG (BURR) ×3 IMPLANT
BUR PRECISION FLUTE 6.0 (BURR) ×3 IMPLANT
CANISTER SUCT 3000ML PPV (MISCELLANEOUS) ×3 IMPLANT
CLOSURE WOUND 1/2 X4 (GAUZE/BANDAGES/DRESSINGS) ×1
DECANTER SPIKE VIAL GLASS SM (MISCELLANEOUS) ×3 IMPLANT
DRAPE LAPAROTOMY 100X72X124 (DRAPES) ×3 IMPLANT
DRAPE MICROSCOPE LEICA (MISCELLANEOUS) ×3 IMPLANT
DRAPE POUCH INSTRU U-SHP 10X18 (DRAPES) ×3 IMPLANT
DRAPE PROXIMA HALF (DRAPES) IMPLANT
DRAPE SURG 17X23 STRL (DRAPES) ×3 IMPLANT
DRSG OPSITE POSTOP 4X6 (GAUZE/BANDAGES/DRESSINGS) ×3 IMPLANT
DURAPREP 26ML APPLICATOR (WOUND CARE) ×3 IMPLANT
ELECT BLADE 4.0 EZ CLEAN MEGAD (MISCELLANEOUS) ×3
ELECT REM PT RETURN 9FT ADLT (ELECTROSURGICAL) ×3
ELECTRODE BLDE 4.0 EZ CLN MEGD (MISCELLANEOUS) ×1 IMPLANT
ELECTRODE REM PT RTRN 9FT ADLT (ELECTROSURGICAL) ×1 IMPLANT
GAUZE SPONGE 4X4 12PLY STRL (GAUZE/BANDAGES/DRESSINGS) ×3 IMPLANT
GAUZE SPONGE 4X4 16PLY XRAY LF (GAUZE/BANDAGES/DRESSINGS) IMPLANT
GLOVE BIO SURGEON STRL SZ8 (GLOVE) ×3 IMPLANT
GLOVE ECLIPSE 7.5 STRL STRAW (GLOVE) ×9 IMPLANT
GLOVE EXAM NITRILE LRG STRL (GLOVE) IMPLANT
GLOVE EXAM NITRILE MD LF STRL (GLOVE) IMPLANT
GLOVE EXAM NITRILE XL STR (GLOVE) IMPLANT
GLOVE EXAM NITRILE XS STR PU (GLOVE) IMPLANT
GLOVE INDICATOR 7.5 STRL GRN (GLOVE) ×3 IMPLANT
GLOVE INDICATOR 8.0 STRL GRN (GLOVE) ×3 IMPLANT
GLOVE INDICATOR 8.5 STRL (GLOVE) ×3 IMPLANT
GLOVE SURG SS PI 7.0 STRL IVOR (GLOVE) ×3 IMPLANT
GOWN STRL REUS W/ TWL LRG LVL3 (GOWN DISPOSABLE) ×1 IMPLANT
GOWN STRL REUS W/ TWL XL LVL3 (GOWN DISPOSABLE) ×2 IMPLANT
GOWN STRL REUS W/TWL 2XL LVL3 (GOWN DISPOSABLE) ×3 IMPLANT
GOWN STRL REUS W/TWL LRG LVL3 (GOWN DISPOSABLE) ×2
GOWN STRL REUS W/TWL XL LVL3 (GOWN DISPOSABLE) ×4
KIT BASIN OR (CUSTOM PROCEDURE TRAY) ×3 IMPLANT
KIT ROOM TURNOVER OR (KITS) ×3 IMPLANT
LIQUID BAND (GAUZE/BANDAGES/DRESSINGS) ×3 IMPLANT
NEEDLE HYPO 22GX1.5 SAFETY (NEEDLE) ×3 IMPLANT
NEEDLE SPNL 22GX3.5 QUINCKE BK (NEEDLE) ×3 IMPLANT
NS IRRIG 1000ML POUR BTL (IV SOLUTION) ×3 IMPLANT
PACK LAMINECTOMY NEURO (CUSTOM PROCEDURE TRAY) ×3 IMPLANT
RUBBERBAND STERILE (MISCELLANEOUS) ×6 IMPLANT
SPONGE SURGIFOAM ABS GEL SZ50 (HEMOSTASIS) ×3 IMPLANT
STRIP CLOSURE SKIN 1/2X4 (GAUZE/BANDAGES/DRESSINGS) ×2 IMPLANT
SUT VIC AB 0 CT1 18XCR BRD8 (SUTURE) ×1 IMPLANT
SUT VIC AB 0 CT1 8-18 (SUTURE) ×2
SUT VIC AB 2-0 CT1 18 (SUTURE) ×3 IMPLANT
SUT VICRYL 4-0 PS2 18IN ABS (SUTURE) ×3 IMPLANT
TOWEL OR 17X24 6PK STRL BLUE (TOWEL DISPOSABLE) ×3 IMPLANT
TOWEL OR 17X26 10 PK STRL BLUE (TOWEL DISPOSABLE) ×3 IMPLANT
WATER STERILE IRR 1000ML POUR (IV SOLUTION) ×3 IMPLANT

## 2016-02-27 NOTE — Anesthesia Postprocedure Evaluation (Signed)
Anesthesia Post Note  Patient: Jill Bryant  Procedure(s) Performed: Procedure(s) (LRB): Laminectomy and Foraminotomy - Lumbar four-five -Lumbar three-four - left diskectomy Lumbar three-four (Left)  Patient location during evaluation: PACU Anesthesia Type: General Level of consciousness: awake and alert Pain management: pain level controlled Vital Signs Assessment: post-procedure vital signs reviewed and stable Respiratory status: spontaneous breathing, nonlabored ventilation, respiratory function stable and patient connected to nasal cannula oxygen Cardiovascular status: blood pressure returned to baseline and stable Postop Assessment: no signs of nausea or vomiting Anesthetic complications: no    Last Vitals:  Filed Vitals:   02/27/16 1215 02/27/16 1223  BP:  126/80  Pulse: 72 72  Temp:  36.7 C  Resp: 16 17    Last Pain:  Filed Vitals:   02/27/16 1225  PainSc: 3     LLE Motor Response: Purposeful movement;Responds to commands (02/27/16 1223) LLE Sensation: Full sensation (02/27/16 1223) RLE Motor Response: Purposeful movement;Responds to commands (02/27/16 1223) RLE Sensation: Full sensation (02/27/16 1223)      Montez Hageman

## 2016-02-27 NOTE — Transfer of Care (Signed)
Immediate Anesthesia Transfer of Care Note  Patient: Jill Bryant  Procedure(s) Performed: Procedure(s): Laminectomy and Foraminotomy - Lumbar four-five -Lumbar three-four - left diskectomy Lumbar three-four (Left)  Patient Location: PACU  Anesthesia Type:General  Level of Consciousness: awake, patient cooperative and responds to stimulation  Airway & Oxygen Therapy: Patient Spontanous Breathing and Patient connected to nasal cannula oxygen  Post-op Assessment: Report given to RN and Post -op Vital signs reviewed and stable  Post vital signs: Reviewed and stable  Last Vitals:  Filed Vitals:   02/27/16 0812  BP: 107/72  Pulse: 70  Temp: 36.7 C  Resp: 20    Last Pain: There were no vitals filed for this visit.    Patients Stated Pain Goal: 4 (14/10/30 1314)  Complications: No apparent anesthesia complications

## 2016-02-27 NOTE — Evaluation (Signed)
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  Clinical Impression  Patient presents with decreased mobility due to deficits listed in PT problem list.  She will benefit from skilled PT in the acute setting to allow d/c home with family support.  Likely not to need follow up PT initially.     Follow Up Recommendations No PT follow up    Equipment Recommendations  3in1 (PT)    Recommendations for Other Services       Precautions / Restrictions Precautions Precautions: Back;Fall      Mobility  Bed Mobility Overal bed mobility: Needs Assistance Bed Mobility: Rolling;Sidelying to Sit;Sit to Sidelying Rolling: Supervision Sidelying to sit: Supervision     Sit to sidelying: Min assist General bed mobility comments: assist with legs into bed; cues for technique and used railing  Transfers Overall transfer level: Needs assistance Equipment used: Rolling walker (2 wheeled) Transfers: Sit to/from Stand Sit to Stand: Min guard;Supervision         General transfer comment: assist for balance initially, from 3:1 over toilet in bathroom supervision  Ambulation/Gait Ambulation/Gait assistance: Min guard;Supervision Ambulation Distance (Feet): 250 Feet Assistive device: Rolling walker (2 wheeled) Gait Pattern/deviations: Step-through pattern;Decreased stride length     General Gait Details: appropriate use of RW in hallway even with turns; used no device to bathroom and gave minguard assist  Stairs            Wheelchair Mobility    Modified Rankin (Stroke Patients Only)       Balance Overall balance assessment: Needs assistance          Standing balance support: No upper extremity supported Standing balance-Leahy Scale: Fair Standing balance comment: washed hands in sink after toileting no UE support                             Pertinent Vitals/Pain Pain Assessment: No/denies pain    Home Living Family/patient expects to be discharged to:: Private residence Living Arrangements: Alone Available Help at Discharge: Friend(s);Available 24 hours/day Type of Home: Independent living facility ([facility]) Home Access: Stairs to enter Entrance Stairs-Rails: Right Entrance Stairs-Number of Steps: 3 Home Layout: One level Home Equipment: Walker - 2 wheels;Shower seat - built in Additional Comments: daughter in law states plans to get grabbar    Prior Function Level of Independence: Independent               Hand Dominance        Extremity/Trunk Assessment   Upper Extremity Assessment: Overall WFL for tasks assessed           Lower Extremity Assessment: Overall WFL for tasks assessed         Communication   Communication: No difficulties  Cognition Arousal/Alertness: Awake/alert Behavior During Therapy: WFL for tasks assessed/performed Overall Cognitive Status: Within Functional Limits for tasks assessed                      General Comments General comments (skin integrity, edema, etc.): Educated on back precautions verbally and throughout session with functional mobility    Exercises        Assessment/Plan  PT Assessment Patient needs continued PT services  PT Diagnosis Abnormality of gait   PT Problem List Decreased mobility;Decreased safety awareness;Decreased knowledge of precautions;Decreased knowledge of use of DME;Pain  PT Treatment Interventions DME instruction;Gait training;Functional mobility training;Therapeutic activities;Patient/family education;Balance training   PT Goals (Current goals can be found in the Care Plan section) Acute Rehab PT Goals Patient  Stated Goal: To go home PT Goal Formulation: With patient/family Time For Goal Achievement: 03/01/16 Potential to Achieve Goals: Good    Frequency Min 5X/week   Barriers to discharge        Co-evaluation               End of Session Equipment Utilized During Treatment: Gait belt Activity Tolerance: Patient tolerated treatment well Patient left: in bed;with call bell/phone within reach;with family/visitor present           Time: 4932-4199 PT Time Calculation (min) (ACUTE ONLY): 26 min   Charges:   PT Evaluation $PT Eval Moderate Complexity: 1 Procedure PT Treatments $Gait Training: 8-22 mins   PT G CodesReginia Naas Mar 25, 2016, 5:51 PM  Magda Kiel, Broussard 2016-03-25

## 2016-02-27 NOTE — Anesthesia Preprocedure Evaluation (Addendum)
Anesthesia Evaluation  Patient identified by MRN, date of birth, ID band Patient awake    Reviewed: Allergy & Precautions, NPO status , Patient's Chart, lab work & pertinent test results  History of Anesthesia Complications (+) PONV  Airway Mallampati: II  TM Distance: >3 FB Neck ROM: Full    Dental no notable dental hx. (+) Upper Dentures   Pulmonary asthma , sleep apnea and Continuous Positive Airway Pressure Ventilation , COPD, former smoker,    Pulmonary exam normal breath sounds clear to auscultation       Cardiovascular hypertension, Pt. on medications + Peripheral Vascular Disease  Normal cardiovascular exam Rhythm:Regular Rate:Normal  S/p AVR 2013   Neuro/Psych negative neurological ROS  negative psych ROS   GI/Hepatic Neg liver ROS, GERD  ,  Endo/Other  negative endocrine ROS  Renal/GU Renal InsufficiencyRenal disease  negative genitourinary   Musculoskeletal negative musculoskeletal ROS (+)   Abdominal   Peds negative pediatric ROS (+)  Hematology negative hematology ROS (+)   Anesthesia Other Findings   Reproductive/Obstetrics negative OB ROS                            Anesthesia Physical Anesthesia Plan  ASA: III  Anesthesia Plan: General   Post-op Pain Management:    Induction: Intravenous  Airway Management Planned: Oral ETT  Additional Equipment:   Intra-op Plan:   Post-operative Plan: Extubation in OR  Informed Consent: I have reviewed the patients History and Physical, chart, labs and discussed the procedure including the risks, benefits and alternatives for the proposed anesthesia with the patient or authorized representative who has indicated his/her understanding and acceptance.   Dental advisory given  Plan Discussed with: CRNA  Anesthesia Plan Comments:         Anesthesia Quick Evaluation

## 2016-02-27 NOTE — Op Note (Signed)
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  EBL: Minimal  History of present illness: Patient is a 75 year old female is a lungs a back and left leg pain refractory to all forms of conservative treatment. Workup has revealed severe stenosis from herniated nucleus versus L3-4 on the left and severe stenosis at L4-5 on the left due to lumbar spondylosis. Due the patient's failure of conservative treatment imaging findings and progression of clinical syndrome I recommended discectomy L3-4 decompression L4-5. I've extensively gone over the t risks and benefits of the operation with the patient as well as perioperative course expectations of outcome and alternatives to surgery she understands and agrees to proceed forward.  Operative procedure: Patient was brought into the ER was induced under general anesthesia positioned prone the Wilson frame her back was prepped and draped in routine sterile fashion preoperative fracture localizing proper levels after infiltration of 10 mL lidocaine with epi a midline incision was made and Bovie L at cautery was used to take down the 60s tissue and subperiosteal dissection or lamina of L3-4 and L4-5. Intraoperative x-ray confirmed identification appropriate level so using a high-speed drill bit in for aspect of left L3 medial facet complex super aspect of the lamina of L4 and inferior L4 medial facet and superior L5 was drilled down. Intraoperative x-ray confirmed a  proper level. Laminotomy was begun with a 3 mm Kerrison punch at both  levels liquefied was identified and removed piecemeal fashion. Under microscopic Minasian first working L4-5-1 large spur remove the medial facet complex that was causing severe compression of the left L5 nerve root. After the spurs removed the pedicle was identified the medial gutter was entered and decompressing the lateral canal. At the the decompression there is a further stenosis of the thecal sac or left L5 nerve root. This was packed with Gelfoam attention taken L3-4. In a similar fashion aggressive undercutting the medial facet complex allowed identification of the L4 pedicle then the disc space identified and noted be herniated it was incised with an 11 blade scalpel cleaned out with pituitary rongeurs Epstein curettes. At the end of discectomies over the status of the sac or left L4 nerve root was encompassing irrigated fixing status was maintained Gelfoam was overweight epidural muscle fascia approximately layers with interrupted Vicryl's concerns running 4 subcuticular Dermabond and a sterile dressing was applied patient to recovery room in stable condition. At the end the case all needle, sponge counts were correct.

## 2016-02-27 NOTE — H&P (Signed)
Jill Bryant is an 75 y.o. female.   Chief Complaint: Back and left leg pain HPI: 74 year old female with back and left hip and leg pain radiating down L4 and L5 nerve root pattern. 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 Patient failed all forms conservative treatment physical therapy epidural steroid injections and time. Due to her failure conservative treatment imaging findings and progression of clinical syndrome I recommended decompressive lumbar laminectomy at L4-5 and a laminectomy microdiscectomy at L3-4. I extensively reviewed the risks and benefits of the operation the patient as well as perioperative course expectations of outcome and alternatives of surgery and she understood and agreed to proceed forward. Past Medical History  Diagnosis Date  . Hyperlipidemia   . Hypertension   . UTI (lower urinary tract infection)   . Back pain, chronic   . Wears glasses   . PONV (postoperative nausea and vomiting)   . Lumbar stenosis   . TIA (transient ischemic attack)   . Headache     migraines  . CHF (congestive heart failure) ([place])   . Sleep apnea     wears CPAP  . Retinal tear     right eye  . COPD (chronic obstructive pulmonary disease) ([place])   . Asthma   . Pneumonia   . Hypothyroidism   . Depression   . Anxiety   . Kidney atrophy     with cysts on right  . GERD (gastroesophageal reflux disease)   . Seizures ([place])     with morphine  . Arthritis   . Anemia   . Shortness of breath dyspnea     with exertion    Past Surgical History  Procedure Laterality Date  . Cardiac surgery      valve replacement 2013; aorta replacement 2002  . Hernia repair    . Cholecystectomy    . Lumbar epidural injection    . Aortobifemoral bypass    . Aortic endarterectomy    . Cataract extraction w/  intraocular lens  implant, bilateral    . Tubal ligation    . Colonoscopy    . Multiple tooth extractions    . Breast surgery      cyst aspiration right breast    Family History  Problem Relation Age of Onset  . Other Mother   . Alzheimer's disease Father    Social History:  reports that she quit smoking about 12 years ago. She has never used smokeless tobacco. She reports that she does not drink alcohol or use illicit drugs.  Allergies:  Allergies  Allergen Reactions  . Amlodipine Swelling    Feet/toes swelling  . Metoprolol Other (See Comments)    Bradycardia and fatigue  . Morphine And Related Other (See Comments)    Hallucinations/seizures  . Tape Rash    Medications Prior to Admission  Medication Sig Dispense Refill  . acetaminophen (TYLENOL) 500 MG tablet Take 1,000 mg by mouth every 6 (six) hours as needed for moderate pain.    Marland Kitchen albuterol (ACCUNEB) 0.63 MG/3ML nebulizer solution Take 1 ampule by nebulization 2 (two) times daily.    Marland Kitchen albuterol (PROVENTIL HFA;VENTOLIN HFA) 108 (90 BASE) MCG/ACT inhaler Inhale into the lungs as needed.    . cephALEXin (KEFLEX) 250 MG capsule Take by mouth 4 (four) times daily.    Marland Kitchen  cetirizine (ZYRTEC) 10 MG tablet Take 10 mg by mouth daily as needed for allergies.     . diazepam (VALIUM) 5 MG tablet Take 5 mg by mouth every 6 (six) hours as needed for sedation.     Marland Kitchen FLUoxetine (PROZAC) 20 MG capsule Take 1 capsule by mouth daily.    . furosemide (LASIX) 40 MG tablet Take 40 mg by mouth 2 (two) times daily.     Marland Kitchen gabapentin (NEURONTIN) 300 MG capsule Take 600 mg by mouth at bedtime.     Marland Kitchen HYDROcodone-acetaminophen (NORCO/VICODIN) 5-325 MG per tablet Take 1 tablet by mouth every 6 (six) hours as needed for moderate pain.     Marland Kitchen levothyroxine (SYNTHROID, LEVOTHROID) 112 MCG tablet Take 1 tablet by mouth daily.    Marland Kitchen omeprazole (PRILOSEC) 20 MG capsule Take 20 mg by mouth daily.     . OXYGEN Inhale 2-4 L into the lungs at bedtime.    Marland Kitchen  SPIRIVA HANDIHALER 18 MCG inhalation capsule Place 18 mcg into inhaler and inhale daily.     Marland Kitchen telmisartan (MICARDIS) 20 MG tablet Take 20 mg by mouth daily.    Marland Kitchen aspirin EC 81 MG tablet Take 81 mg by mouth daily.    . nitroGLYCERIN (NITROSTAT) 0.4 MG SL tablet Place 1 tablet (0.4 mg total) under the tongue every 5 (five) minutes. 25 tablet 3  . Omega-3 1000 MG CAPS Take 1 capsule by mouth daily.      No results found for this or any previous visit (from the past 48 hour(s)). No results found.  Review of Systems  Constitutional: Negative.   HENT: Negative.   Eyes: Negative.   Respiratory: Negative.   Cardiovascular: Negative.   Gastrointestinal: Negative.   Genitourinary: Negative.   Musculoskeletal: Positive for myalgias and back pain.  Skin: Negative.   Neurological: Positive for tingling and sensory change.  Psychiatric/Behavioral: Negative.     Blood pressure 107/72, pulse 70, temperature 98 F (36.7 C), temperature source Oral, resp. rate 20, height '5\' 8"'$  (1.727 m), weight 98.799 kg (217 lb 13 oz), SpO2 98 %. Physical Exam  Constitutional: She is oriented to person, place, and time. She appears well-developed and well-nourished.  HENT:  Head: Normocephalic.  Eyes: Pupils are equal, round, and reactive to light.  Neck: Normal range of motion.  Respiratory: Effort normal.  GI: Soft. Bowel sounds are normal.  Neurological: She is alert and oriented to person, place, and time. She has normal strength. GCS eye subscore is 4. GCS verbal subscore is 5. GCS motor subscore is 6.  Strength 5 out of 5 in her iliopsoas, quads, his shoes, gastric, into tibialis, and EHL.  Skin: Skin is warm and dry.     Assessment/Plan   75yo presents for a decompressive laminectomy L4-5 lumbar micro-discectomy L3-4   Hephzibah Strehle P, MD 02/27/2016, 9:04 AM

## 2016-02-28 MED ORDER — OXYCODONE-ACETAMINOPHEN 5-325 MG PO TABS: 1.0000 | ORAL_TABLET | ORAL | Status: DC | PRN

## 2016-02-28 NOTE — Discharge Summary (Signed)
Physician Discharge Summary  Patient ID: Jill Bryant MRN: [id number] DOB/AGE: 1940-12-03 75 y.o.  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  Consults: Significant Diagnostic Studies: Treatments: Lumbar micro-discectomy L3-4 left decompressive laminotomy L4-5 left Discharge Exam: Blood pressure 104/47, pulse 73, temperature 98.2 F (36.8 C), temperature source Oral, resp. rate 20, height '5\' 8"'$  (1.727 m), weight 98.799 kg (217 lb 13 oz), SpO2 97 %. Strength out of 5 wound clean dry and intact  Disposition: Home     Medication List    TAKE these medications        acetaminophen 500 MG tablet  Commonly known as:  TYLENOL  Take 1,000 mg by mouth every 6 (six) hours as needed for moderate pain.     albuterol 108 (90 Base) MCG/ACT inhaler  Commonly known as:  PROVENTIL HFA;VENTOLIN HFA  Inhale into the lungs as needed.     albuterol 0.63 MG/3ML nebulizer solution  Commonly known as:  ACCUNEB  Take 1 ampule by nebulization 2 (two) times daily.     aspirin EC 81 MG tablet  Take 81 mg by mouth daily.     cephALEXin 250 MG capsule  Commonly known as:  KEFLEX  Take by mouth 4 (four) times daily.     cetirizine 10 MG tablet  Commonly known as:  ZYRTEC  Take 10 mg by mouth daily as needed for allergies.     diazepam 5 MG tablet  Commonly known as:  VALIUM  Take 5 mg by mouth every 6 (six) hours as needed for sedation.     FLUoxetine 20 MG capsule  Commonly known as:  PROZAC   Take 1 capsule by mouth daily.     furosemide 40 MG tablet  Commonly known as:  LASIX  Take 40 mg by mouth 2 (two) times daily.     gabapentin 300 MG capsule  Commonly known as:  NEURONTIN  Take 600 mg by mouth at bedtime.     HYDROcodone-acetaminophen 5-325 MG tablet  Commonly known as:  NORCO/VICODIN  Take 1 tablet by mouth every 6 (six) hours as needed for moderate pain.     levothyroxine 112 MCG tablet  Commonly known as:  SYNTHROID, LEVOTHROID  Take 1 tablet by mouth daily.     nitroGLYCERIN 0.4 MG SL tablet  Commonly known as:  NITROSTAT  Place 1 tablet (0.4 mg total) under the tongue every 5 (five) minutes.     Omega-3 1000 MG Caps  Take 1 capsule by mouth daily.     omeprazole 20 MG capsule  Commonly known as:  PRILOSEC  Take 20 mg by mouth daily.     oxyCODONE-acetaminophen 5-325 MG tablet  Commonly known as:  PERCOCET/ROXICET  Take 1-2 tablets by mouth every 4 (four) hours as needed for moderate pain.     OXYGEN  Inhale 2-4 L into the lungs at bedtime.     SPIRIVA HANDIHALER 18 MCG inhalation capsule  Generic drug:  tiotropium  Place 18 mcg into inhaler and inhale daily.     telmisartan 20 MG tablet  Commonly known  as:  MICARDIS  Take 20 mg by mouth daily.           Follow-up Information    Follow up with [facility], MD.   Specialty:  Neurosurgery   Contact information:   [address] [place] [address] [place] [address] [phone]       Signed: Elaina Hoops 02/28/2016, 7:31 AM

## 2016-02-28 NOTE — Evaluation (Addendum)
Occupational Therapy Evaluation Patient Details Name: Jill Bryant MRN: [id number] DOB: [birth year] Today's Date: 02/28/2016    History of Present Illness 75 y.o. s/p lumbar laminectomy microdiscectomy L3-4 on the left with microdissection of left L4 nerve root microscopic discectomy and decompressive lumbar limited L4-5 and left with microscopic foraminotomy dissection of the left L5 nerve root. PMH includes HLD, HTN, UTI, chronic back pain, TIA, migraines, CHF, COPD, depression, anxiety, hypothyroidism, GERD, arthritis, cardiac surgery.   Clinical Impression   Pt s/p above. Education provided in session and plan is for pt to d/c today. OT signing off.    Follow Up Recommendations  No OT follow up;Supervision - Intermittent    Equipment Recommendations  3 in 1 bedside comode;Other (comment) (AE)    Recommendations for Other Services       Precautions / Restrictions Precautions Precautions: Back;Fall Precaution Booklet Issued:  (one in room) Precaution Comments: reviewed back precautions Restrictions Weight Bearing Restrictions: No      Mobility Bed Mobility General bed mobility comments: did not perform in session  Transfers Overall transfer level: Needs assistance Transfers: Sit to/from Stand Sit to Stand: Supervision            Balance    Unsteady with ambulation without RW.                                    ADL Overall ADL's : Needs assistance/impaired Eating/Feeding: Independent;Sitting               Upper Body Dressing : Set up;Supervision/safety;Sitting   Lower Body Dressing: Maximal assistance;Sit to/from stand   Toilet Transfer: Ambulation (sit to stand from chair; Min guard without RW; Supervision ambulating with RW and set up for RW)            Functional mobility during ADLs: Rolling walker (Min guard ambulating without RW; Supervision ambulating with RW and set up for RW) General ADL Comments: Discussed incorporating  precautions into functional activities. Educated on AE including what pt could use for toilet aid. Educated on safety such as safe footwear, use of bag on walker, and recommended someone be with her for shower transfer.  Discussed sitting time no longer than 45 min-1 hour.     Vision     Perception     Praxis      Pertinent Vitals/Pain Pain Assessment: 0-10 Pain Score: 3  Pain Location: back Pain Descriptors / Indicators: Aching Pain Intervention(s): Monitored during session     Hand Dominance     Extremity/Trunk Assessment Upper Extremity Assessment Upper Extremity Assessment: Overall WFL for tasks assessed   Lower Extremity Assessment Lower Extremity Assessment: Defer to PT evaluation       Communication Communication Communication: No difficulties   Cognition Arousal/Alertness: Awake/alert Behavior During Therapy: WFL for tasks assessed/performed Overall Cognitive Status: Within Functional Limits for tasks assessed                     General Comments       Exercises       Shoulder Instructions      Home Living Family/patient expects to be discharged to:: Private residence Living Arrangements: Alone Available Help at Discharge: Friend(s);Available 24 hours/day Type of Home: Independent living facility ([facility]) Home Access: Level entry (does have stairs at daughter in laws with right rail)     Home Layout: One level     Bathroom Shower/Tub: Walk-in  shower         Home Equipment: Walker - 2 wheels;Shower seat - built in;Toilet riser          Prior Functioning/Environment Level of Independence: Independent             OT Diagnosis: Acute pain   OT Problem List: Decreased strength;Decreased range of motion;Impaired balance (sitting and/or standing);Pain;Obesity   OT Treatment/Interventions:      OT Goals(Current goals can be found in the care plan section)   OT Frequency:     Barriers to D/C:            Co-evaluation               End of Session Equipment Utilized During Treatment: Rolling walker;Other (comment) (AE) Nurse Communication: Other (comment);Mobility status (asked about pt getting a 3 in 1)  Activity Tolerance: Patient tolerated treatment well Patient left: in chair;with call bell/phone within reach;with family/visitor present   Time: 1224-4975 OT Time Calculation (min): 22 min Charges:  OT General Charges $OT Visit: 1 Procedure OT Evaluation $OT Eval Moderate Complexity: 1 Procedure G-CodesBenito Mccreedy OTR/L [id number] 02/28/2016, 9:56 AM

## 2016-02-28 NOTE — Progress Notes (Addendum)
Physical Therapy Treatment Patient Details Name: Jill Bryant MRN: [id number] DOB: 1941-06-08 Today's Date: 03/02/16    History of Present Illness Patient is a 75 y/o admitted with disc herniation at L3-4 with severe stenosis and compression of the left L4 nerve root as well as marked spondylosis now s/p L3-4, L4-5 decompressive laminectomy and microdiscectomy L4.    PT Comments    Patient moving well. Required vc for safe use of RW and to adhere to not twisting. Family present and educated as well. They will be staying with patient. No further PT needs.  Follow Up Recommendations  No PT follow up     Equipment Recommendations  3in1 (PT)    Recommendations for Other Services       Precautions / Restrictions Precautions Precautions: Back;Fall Precaution Booklet Issued: Yes (comment) Precaution Comments: thoroughly reviewed handout with demonstrations    Mobility  Bed Mobility Overal bed mobility: Modified Independent Bed Mobility: Rolling;Sidelying to Sit Rolling: Modified independent (Device/Increase time) Sidelying to sit: Modified independent (Device/Increase time)          Transfers Overall transfer level: Needs assistance Equipment used: Rolling walker (2 wheeled) Transfers: Sit to/from Stand Sit to Stand: Supervision         General transfer comment: vc for safe use of RW  Ambulation/Gait Ambulation/Gait assistance: Supervision Ambulation Distance (Feet): 110 Feet Assistive device: Rolling walker (2 wheeled) Gait Pattern/deviations: WFL(Within Functional Limits)   Gait velocity interpretation: at or above normal speed for age/gender General Gait Details: appropriate use of RW in hallway even with turns   Stairs Stairs: Yes Stairs assistance: Min guard Stair Management: One rail Right;Step to pattern;Sideways Number of Stairs: 4    Wheelchair Mobility    Modified Rankin (Stroke Patients Only)       Balance             Standing  balance-Leahy Scale: Fair                      Cognition Arousal/Alertness: Awake/alert Behavior During Therapy: WFL for tasks assessed/performed Overall Cognitive Status: Within Functional Limits for tasks assessed                      Exercises      General Comments General comments (skin integrity, edema, etc.): Daughter in law and friend present for all education      Pertinent Vitals/Pain Pain Assessment: No/denies pain    Home Living                      Prior Function            PT Goals (current goals can now be found in the care plan section) Acute Rehab PT Goals Patient Stated Goal: To go home Time For Goal Achievement: 03/01/16 Progress towards PT goals: Progressing toward goals    Frequency  Min 5X/week    PT Plan Current plan remains appropriate    Co-evaluation             End of Session   Activity Tolerance: Patient tolerated treatment well Patient left: in chair;with call bell/phone within reach;with family/visitor present     Time: 4098-1191 PT Time Calculation (min) (ACUTE ONLY): 29 min  Charges:  $Gait Training: 23-37 mins                    G Codes:      Yazlyn Wentzel 03/02/16, 9:14 AM Pager [phone]

## 2016-02-28 NOTE — Progress Notes (Signed)
Patient alert and oriented, mae's well, voiding adequate amount of urine, swallowing without difficulty, no c/o pain. Patient discharged home with family. Script and discharged instructions given to patient. Patient and family stated understanding of d/c instructions given and has an appointment with MD. 

## 2016-02-28 NOTE — Progress Notes (Signed)
Patient ID: Jill Bryant, female   DOB: 07/21/1941, 75 y.o.   MRN: [id number] Patient doing well no leg pain  Strength out of 5 wound clean dry and intact  Discharge home

## 2016-02-28 NOTE — Discharge Instructions (Signed)

## 2016-03-01 ENCOUNTER — Encounter (HOSPITAL_COMMUNITY): Payer: Self-pay | Admitting: Neurosurgery

## 2016-08-22 ENCOUNTER — Telehealth: Payer: Self-pay | Admitting: Internal Medicine

## 2016-08-22 NOTE — Telephone Encounter (Signed)
Please call,pt says she just feels tired a lot.

## 2016-08-22 NOTE — Telephone Encounter (Signed)
Returned call to patient.She stated she feels awful.Stated she has no energy,sob,occasional chest pain.Stated she recently saw PCP and nothing was done.Appointment scheduled with Rosaria Ferries PA 08/24/16 at 2:00 pm.

## 2016-08-24 ENCOUNTER — Ambulatory Visit (INDEPENDENT_AMBULATORY_CARE_PROVIDER_SITE_OTHER): Payer: Medicare HMO | Admitting: Physician Assistant

## 2016-08-24 ENCOUNTER — Encounter: Payer: Self-pay | Admitting: Physician Assistant

## 2016-08-24 VITALS — BP 116/78 | HR 60 | Ht 66.0 in | Wt 206.0 lb

## 2016-08-24 DIAGNOSIS — R0609 Other forms of dyspnea: Secondary | ICD-10-CM | POA: Diagnosis not present

## 2016-08-24 DIAGNOSIS — R072 Precordial pain: Secondary | ICD-10-CM | POA: Diagnosis not present

## 2016-08-24 DIAGNOSIS — I679 Cerebrovascular disease, unspecified: Secondary | ICD-10-CM

## 2016-08-24 DIAGNOSIS — Z95828 Presence of other vascular implants and grafts: Secondary | ICD-10-CM | POA: Diagnosis not present

## 2016-08-24 DIAGNOSIS — I493 Ventricular premature depolarization: Secondary | ICD-10-CM

## 2016-08-24 NOTE — Patient Instructions (Signed)
Medication Instructions: Your physician recommends that you continue on your current medications as directed. Please refer to the Current Medication list given to you today.   Testing/Procedures: Your physician has requested that you have a lower extremity arterial duplex. During this test, ultrasound is used to evaluate arterial blood flow in the legs. Allow one hour for this exam. There are no restrictions or special instructions.   Follow-Up: You have a recall in the system to schedule your annual follow-up appointment with Dr. Debara Pickett. You will receive a letter in the mail 2 months in advance and you can call to schedule your appointment from that.  If you need a refill on your cardiac medications before your next appointment, please call your pharmacy.

## 2016-08-24 NOTE — Progress Notes (Signed)
Cardiology Office Note   Date:  08/24/2016   ID:  Jill Bryant, DOB 03-03-1941, MRN [id number]  PCP:  Red Christians, MD  Cardiologist: Dr Debara Pickett 01/23/2016  Was [facility] in [place], Suanne Marker, PA-C   Chief Complaint  Patient presents with  . Follow-up  . Shortness of Breath  . Chest Pain  . Fatigue    History of Present Illness: Jill Bryant is a 75 y.o. 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 COPD, OSA on CPAP, CKD III w/ 1 kidney, amaurosis fugax, GERD  Jill Bryant presents for Evaluation of her chest pain and shortness of breath.  She has significant back problems and walks with a walker that she has to cover any significant distance. She has only a small amount of daytime lower extremity edema. She does not wake up with any significant edema.   She has chronic dyspnea on exertion. However, she admits that her stamina greatly increased when she was doing rehabilitation after her surgery. She also admits that she had a very good time when her son and daughter-in-law took her to the mountains. While she was in the mountains, she used her walker as directed, but her activity level was much higher than normal, and she did not have any problems with this. In fact, this was very enjoyable for her. Since she came back from the mountains, she has not done that much, but is willing to start doing more. She recognizes that a higher activity level will benefit her.  When she starts thinking about being more active, she wonders if it is okay for her to do water aerobics. She states that her dyspnea on exertion is not any worse than it has been. Her chest pain has not been consistent, and it has not been exertional. It is not bothering her very much now.   Past  Medical History:  Diagnosis Date  . Anemia   . Anxiety   . Arthritis   . Asthma   . Back pain, chronic   . CHF (congestive heart failure) ([place])   . COPD (chronic obstructive pulmonary disease) ([place])   . Depression   . GERD (gastroesophageal reflux disease)   . Headache    migraines  . Hyperlipidemia   . Hypertension   . Hypothyroidism   . 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 Lumbar stenosis   . Pneumonia   . PONV (postoperative nausea and vomiting)   . Retinal tear    right eye  . Seizures ([place])    with morphine  . Shortness of breath dyspnea    with exertion  . Sleep apnea    wears CPAP  . TIA (transient ischemic attack)   . UTI (lower urinary tract infection)   . Wears glasses     Past Surgical History:  Procedure Laterality Date  . aortic endarterectomy    . aortobifemoral bypass    . BREAST SURGERY     cyst aspiration right breast  . CARDIAC SURGERY     valve replacement 2013; aorta replacement 2002  . CATARACT EXTRACTION W/ INTRAOCULAR LENS  IMPLANT, BILATERAL    . CHOLECYSTECTOMY    . COLONOSCOPY    . HERNIA REPAIR    . LUMBAR EPIDURAL INJECTION    .  LUMBAR LAMINECTOMY/DECOMPRESSION MICRODISCECTOMY Left 02/27/2016   Procedure: Laminectomy and Foraminotomy - Lumbar four-five -Lumbar three-four - left diskectomy Lumbar three-four;  Surgeon: Kary Kos, MD;  Location: [facility];  Service: Neurosurgery;  Laterality: Left;  Marland Kitchen MULTIPLE TOOTH EXTRACTIONS    . TUBAL LIGATION      Current Outpatient Prescriptions  Medication Sig Dispense Refill  . acetaminophen (TYLENOL) 500 MG tablet Take 1,000 mg by mouth every 6 (six) hours as needed for moderate pain.    Marland Kitchen albuterol (ACCUNEB) 0.63 MG/3ML nebulizer solution Take 1 ampule by nebulization 2 (two) times daily.    Marland Kitchen albuterol (PROVENTIL HFA;VENTOLIN HFA) 108 (90 BASE) MCG/ACT inhaler Inhale into the lungs as needed.    Marland Kitchen aspirin EC 81 MG tablet Take 81 mg by mouth daily.    . cephALEXin (KEFLEX) 250 MG  capsule Take by mouth 4 (four) times daily.    . cetirizine (ZYRTEC) 10 MG tablet Take 10 mg by mouth daily as needed for allergies.     . diazepam (VALIUM) 5 MG tablet Take 5 mg by mouth every 6 (six) hours as needed for sedation.     Marland Kitchen FLUoxetine (PROZAC) 20 MG capsule Take 1 capsule by mouth daily.    . furosemide (LASIX) 40 MG tablet Take 40 mg by mouth 2 (two) times daily.     Marland Kitchen gabapentin (NEURONTIN) 300 MG capsule Take 600 mg by mouth at bedtime.     Marland Kitchen levothyroxine (SYNTHROID, LEVOTHROID) 112 MCG tablet Take 1 tablet by mouth daily.    . nitroGLYCERIN (NITROSTAT) 0.4 MG SL tablet Place 1 tablet (0.4 mg total) under the tongue every 5 (five) minutes. 25 tablet 3  . Omega-3 1000 MG CAPS Take 1 capsule by mouth daily.    Marland Kitchen omeprazole (PRILOSEC) 20 MG capsule Take 20 mg by mouth daily.     . OXYGEN Inhale 2-4 L into the lungs at bedtime.    Marland Kitchen SPIRIVA HANDIHALER 18 MCG inhalation capsule Place 18 mcg into inhaler and inhale daily.     Marland Kitchen telmisartan (MICARDIS) 20 MG tablet Take 20 mg by mouth daily.     No current facility-administered medications for this visit.     Allergies:   Amlodipine; Metoprolol; Morphine and related; and Tape    Social History:  The patient  reports that she quit smoking about 12 years ago. She has never used smokeless tobacco. She reports that she does not drink alcohol or use drugs.   Family History:  The patient's family history includes Alzheimer's disease in her father; Other in her mother.    ROS:  Please see the history of present illness. All other systems are reviewed and negative.    PHYSICAL EXAM: VS:  BP 116/78   Pulse 60   Ht '5\' 6"'$  (1.676 m)   Wt 206 lb (93.4 kg)   BMI 33.25 kg/m  , BMI Body mass index is 33.25 kg/m. GEN: Well nourished, well developed, female in no acute distress  HEENT: normal for age  Neck: no JVD, no carotid bruit, no masses Cardiac: RRR; soft murmur, no rubs, or gallops Respiratory: Few rales bases bilaterally,  normal work of breathing GI: soft, nontender, nondistended, + BS MS: no deformity or atrophy; no edema; distal pulses are 2+ in 3/4 extremities; left DP slightly decreased   Skin: warm and dry, no rash Neuro:  Strength and sensation are intact Psych: euthymic mood, full affect   EKG:  EKG is ordered today. The ekg ordered today  demonstrates sinus rhythm, heart rate 60, no acute ischemic changes, decreased voltage across the precordium that is not significantly different from previous ECGs  CAROTID DOPPLERS: 08/2015 Heterogeneous plaque, bilaterally. Stable 1-39% RICA stenosis. Stable 70-26% LICA stenosis. >50 LECA stenosis. Normal right subclavian artery. Right vertebral artery is patent, with antegrade flow. Partial left subclavian steal, with to-fro flow from the left vertebral artery. (1 year f/u recommended)  LE DOPPLERS: 02/2015 Normal flow in her aorto-bifemoral bypass grafts. Repeat annually.  ECHO: 01/2015 - Left ventricle: The cavity size was normal. Wall thickness was   normal. Systolic function was normal. The estimated ejection   fraction was in the range of 55% to 60%. Wall motion was normal;   there were no regional wall motion abnormalities. Left   ventricular diastolic function parameters were normal. - Aortic valve: A bioprosthesis was present and functioning   normally. Valve area (VTI): 1.84 cm^2. Valve area (Vmax): 1.52 cm^2. - Left atrium: The atrium was moderately dilated. - Pulmonary arteries: Systolic pressure was mildly increased. PA   peak pressure: 47 mm Hg (S).  Recent Labs: 02/21/2016: BUN 15; Creatinine, Ser 1.40; Hemoglobin 12.4; Platelets 203; Potassium 4.3; Sodium 137    Lipid Panel    Component Value Date/Time   CHOL 166 05/10/2015 1106   TRIG 172 (H) 05/10/2015 1106   HDL 44 05/10/2015 1106   LDLCALC 88 05/10/2015 1106     Wt Readings from Last 3 Encounters:  08/24/16 206 lb (93.4 kg)  02/27/16 217 lb 13 oz (98.8 kg)  02/21/16 217  lb 12.8 oz (98.8 kg)     Other studies Reviewed: Additional studies/ records that were reviewed today include: Office notes, hospital records and testing.  ASSESSMENT AND PLAN:  1.  Chest pain: Her symptoms have improved. She took a trip recently where her activity level was substantially higher than normal for her and did very well with. At this time, I do not feel that any ischemic workup is indicated. Sounds like she has already given herself a stress test. Her ECG is unchanged. She is encouraged to continue to increase her activity by whatever means available to her. Water aerobics would be particularly helpful as it would put no stress on her joints.  2. Dyspnea on exertion: She has no signs or symptoms of volume overload. Continue Lasix 40 mg twice a day. The dose has not changed recently.  3. Cerebrovascular and peripheral vascular disease: Her carotid Dopplers were last performed in November 2016, lower extremity Dopplers in May 2016. Both are to be repeated annually. Will work with her on getting these done.  Current medicines are reviewed at length with the patient today.  The patient does not have concerns regarding medicines.  The following changes have been made:  no change  Labs/ tests ordered today include:   Orders Placed This Encounter  Procedures  . EKG 12-Lead     Disposition:   FU with Dr. Debara Pickett  Signed, Rosaria Ferries, PA-C  08/24/2016 5:43 PM    [facility] Phone: [phone]; Fax: [phone]  This note was written with the assistance of speech recognition software. Please excuse any transcriptional errors.

## 2016-09-19 ENCOUNTER — Other Ambulatory Visit: Payer: Self-pay | Admitting: Physician Assistant

## 2016-09-19 ENCOUNTER — Other Ambulatory Visit: Payer: Self-pay | Admitting: Internal Medicine

## 2016-09-19 DIAGNOSIS — G458 Other transient cerebral ischemic attacks and related syndromes: Secondary | ICD-10-CM

## 2016-09-19 DIAGNOSIS — Z95828 Presence of other vascular implants and grafts: Secondary | ICD-10-CM

## 2016-09-25 ENCOUNTER — Ambulatory Visit (HOSPITAL_COMMUNITY)
Admission: RE | Admit: 2016-09-25 | Discharge: 2016-09-25 | Disposition: A | Discharge status: Home / Self Care | Payer: Medicare HMO | Source: Ambulatory Visit | Attending: Cardiovascular Disease | Admitting: Cardiovascular Disease

## 2016-09-25 ENCOUNTER — Encounter (HOSPITAL_COMMUNITY): Payer: Medicare HMO

## 2016-09-25 DIAGNOSIS — I2510 Atherosclerotic heart disease of native coronary artery without angina pectoris: Secondary | ICD-10-CM | POA: Diagnosis not present

## 2016-09-25 DIAGNOSIS — E785 Hyperlipidemia, unspecified: Secondary | ICD-10-CM | POA: Diagnosis not present

## 2016-09-25 DIAGNOSIS — I708 Atherosclerosis of other arteries: Secondary | ICD-10-CM | POA: Insufficient documentation

## 2016-09-25 DIAGNOSIS — Z95828 Presence of other vascular implants and grafts: Secondary | ICD-10-CM

## 2016-09-25 DIAGNOSIS — I739 Peripheral vascular disease, unspecified: Secondary | ICD-10-CM | POA: Insufficient documentation

## 2016-09-25 DIAGNOSIS — G458 Other transient cerebral ischemic attacks and related syndromes: Secondary | ICD-10-CM | POA: Diagnosis not present

## 2016-09-25 DIAGNOSIS — J449 Chronic obstructive pulmonary disease, unspecified: Secondary | ICD-10-CM | POA: Diagnosis not present

## 2016-09-25 DIAGNOSIS — Z48812 Encounter for surgical aftercare following surgery on the circulatory system: Secondary | ICD-10-CM | POA: Insufficient documentation

## 2016-09-25 DIAGNOSIS — I6523 Occlusion and stenosis of bilateral carotid arteries: Secondary | ICD-10-CM | POA: Insufficient documentation

## 2016-09-25 DIAGNOSIS — I10 Essential (primary) hypertension: Secondary | ICD-10-CM | POA: Diagnosis not present

## 2016-10-17 ENCOUNTER — Other Ambulatory Visit: Payer: Self-pay | Admitting: Cardiology

## 2016-10-17 DIAGNOSIS — R079 Chest pain, unspecified: Secondary | ICD-10-CM

## 2016-10-31 ENCOUNTER — Encounter (HOSPITAL_COMMUNITY): Payer: Medicare HMO

## 2017-01-15 ENCOUNTER — Encounter: Payer: Self-pay | Admitting: Internal Medicine

## 2017-01-15 ENCOUNTER — Ambulatory Visit (INDEPENDENT_AMBULATORY_CARE_PROVIDER_SITE_OTHER): Payer: Medicare HMO | Admitting: Internal Medicine

## 2017-01-15 VITALS — BP 124/76 | HR 61 | Ht 66.0 in | Wt 204.0 lb

## 2017-01-15 DIAGNOSIS — Z952 Presence of prosthetic heart valve: Secondary | ICD-10-CM

## 2017-01-15 DIAGNOSIS — I739 Peripheral vascular disease, unspecified: Secondary | ICD-10-CM

## 2017-01-15 DIAGNOSIS — I2510 Atherosclerotic heart disease of native coronary artery without angina pectoris: Secondary | ICD-10-CM

## 2017-01-15 DIAGNOSIS — I779 Disorder of arteries and arterioles, unspecified: Secondary | ICD-10-CM

## 2017-01-15 NOTE — Progress Notes (Signed)
OFFICE NOTE  Chief Complaint:  No complaints  Primary Care Physician: Red Christians, MD  HPI:  Jill Bryant is a pleasant 76 year old female with a list of about 40-50 different medical problems. [facility] gets most of [facility] to the [facility] but due to new changes her insurance is no longer accepted there. She therefore sought out a new cardiologist in [place]. She actually lives in [place] but [facility] [place] [facility] when she was originally living with family in that area. For a long time she lived in the [place] area. In 2002 she was diagnosed as having PAD and significant aortic occlusion. Ultimately she underwent aortobifem bypass by Dr. Thurnell Lose in 2002. 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 Jerelene Redden in [facility]. She was present followed by Dr. Baxter Hire back in 2013 and also saw Dr. Joylene Grapes. She has significant COPD and is followed [facility]. She also has obstructive sleep apnea on CPAP and uses oxygen. She tells me she has stage III chronic kidney disease and a solitary kidney. She may also recently had an amaurosis fugax event. Currently she denies any chest pain. She reports stable shortness of breath with exertion. She denies palpitations but as noted have PVCs on her EKG today. She is not on beta blocker presumably due to her significant pulmonary disease and it is listed as an allergy. She told me that she had severe leg weakness previously on metoprolol.  I saw Jill Bryant back in the office today. She reports that since we saw each other she's had a couple of episodes of "angina". These episodes included chest discomfort for which she took 2  nitroglycerin and 3 baby aspirin. The symptoms did not resolve until about an hour later. She described it as a tightness in the center of her chest which radiated up center of her chest to her jaw. She does have a history of reflux and takes a Prilosec but the symptoms are somewhat different. To me, these episodes do not sound like angina. She has had a number of heart catheterizations, both in 2011 and is well as most recently in 2013. Neither study has demonstrated any obstructive coronary disease.  I saw Jill Bryant back today in follow-up. She is here for preoperative cardiovascular risk assessment. She is planning on undergoing lumbar spine surgery with Dr. Francesca Jewett. From a cardiac standpoint she's had no recurrent anginal symptoms. Her previous symptoms which were attributable to reflux have improved with reflux treatment. Both of her heart catheterizations in 2011 and 2013 showed no significant obstructive coronary disease. Recent echo last year showed normal function of her bioprosthetic aortic valve. She's also had carotid Dopplers which have been stable. She denies any angina or shortness of breath.  01/15/2017  Jill Bryant returns today for follow-up. She seems to be doing pretty well. In January she was sick with bronchitis/COPD exacerbation for most of the month however the last 2 months she's felt very well. She did undergo back surgery last year and she says it's helped significantly. She's had no new worsening chest pain or shortness of breath. In the fall last year she did see Rosaria Ferries, PA-C for chest pain. It essentially had resolved when she  saw her and no further testing was recommended. She ultimately did have her scheduled carotid and lower extremity Dopplers which were stable.  PMHx:  Past Medical History:  Diagnosis Date  . Anemia   . Anxiety   . Arthritis   . Asthma   . Back pain, chronic   . CHF (congestive heart failure) ([place])   . COPD (chronic obstructive pulmonary  disease) ([place])   . Depression   . GERD (gastroesophageal reflux disease)   . Headache    migraines  . Hyperlipidemia   . Hypertension   . Hypothyroidism   . 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 Lumbar stenosis   . Pneumonia   . PONV (postoperative nausea and vomiting)   . Retinal tear    right eye  . Seizures ([place])    with morphine  . Shortness of breath dyspnea    with exertion  . Sleep apnea    wears CPAP  . TIA (transient ischemic attack)   . UTI (lower urinary tract infection)   . Wears glasses     Past Surgical History:  Procedure Laterality Date  . aortic endarterectomy    . aortobifemoral bypass    . BREAST SURGERY     cyst aspiration right breast  . CARDIAC SURGERY     valve replacement 2013; aorta replacement 2002  . CATARACT EXTRACTION W/ INTRAOCULAR LENS  IMPLANT, BILATERAL    . CHOLECYSTECTOMY    . COLONOSCOPY    . HERNIA REPAIR    . LUMBAR EPIDURAL INJECTION    . LUMBAR LAMINECTOMY/DECOMPRESSION MICRODISCECTOMY Left 02/27/2016   Procedure: Laminectomy and Foraminotomy - Lumbar four-five -Lumbar three-four - left diskectomy Lumbar three-four;  Surgeon: Kary Kos, MD;  Location: [facility];  Service: Neurosurgery;  Laterality: Left;  Marland Kitchen MULTIPLE TOOTH EXTRACTIONS    . TUBAL LIGATION      FAMHx:  Family History  Problem Relation Age of Onset  . Other Mother   . Alzheimer's disease Father     SOCHx:   reports that she quit smoking about 13 years ago. She has never used smokeless tobacco. She reports that she does not drink alcohol or use drugs.  ALLERGIES:  Allergies  Allergen Reactions  . Amlodipine Swelling    Feet/toes swelling  . Metoprolol Other (See Comments)    Bradycardia and fatigue  . Morphine And Related Other (See Comments)    Hallucinations/seizures  . Tape Rash    ROS: Pertinent items noted in HPI and remainder of comprehensive ROS otherwise negative.  HOME MEDS: Current Outpatient Prescriptions  Medication Sig Dispense  Refill  . acetaminophen (TYLENOL) 500 MG tablet Take 1,000 mg by mouth every 6 (six) hours as needed for moderate pain.    Marland Kitchen albuterol (ACCUNEB) 0.63 MG/3ML nebulizer solution Take 1 ampule by nebulization 2 (two) times daily.    Marland Kitchen albuterol (PROVENTIL HFA;VENTOLIN HFA) 108 (90 BASE) MCG/ACT inhaler Inhale into the lungs as needed.    Marland Kitchen aspirin EC 81 MG tablet Take 81 mg by mouth daily.    . cephALEXin (KEFLEX) 250 MG capsule Take 250 mg by mouth daily.     . cetirizine (ZYRTEC) 10 MG tablet Take 10 mg by mouth daily as needed for allergies.     . diazepam (VALIUM) 5 MG tablet Take 5 mg by mouth every 6 (six) hours as needed for sedation.     Marland Kitchen FLUoxetine (PROZAC) 20 MG capsule Take 1 capsule by mouth daily.    Marland Kitchen  furosemide (LASIX) 40 MG tablet Take 40 mg by mouth at bedtime. TAKE TWO TABLETS AT NIGHT.    Marland Kitchen gabapentin (NEURONTIN) 300 MG capsule Take 600 mg by mouth at bedtime.     Marland Kitchen levothyroxine (SYNTHROID, LEVOTHROID) 112 MCG tablet Take 1 tablet by mouth daily.    . nitroGLYCERIN (NITROSTAT) 0.4 MG SL tablet Place 1 tablet (0.4 mg total) under the tongue every 5 (five) minutes. 25 tablet 3  . Omega-3 1000 MG CAPS Take 1 capsule by mouth daily.    Marland Kitchen omeprazole (PRILOSEC) 20 MG capsule Take 20 mg by mouth daily.     . OXYGEN Inhale 2-4 L into the lungs at bedtime.    . pravastatin (PRAVACHOL) 20 MG tablet Take 20 mg by mouth at bedtime.    Marland Kitchen telmisartan (MICARDIS) 20 MG tablet Take 20 mg by mouth daily.     No current facility-administered medications for this visit.     LABS/IMAGING: No results found for this or any previous visit (from the past 48 hour(s)). No results found.  WEIGHTS: Wt Readings from Last 3 Encounters:  01/15/17 204 lb (92.5 kg)  08/24/16 206 lb (93.4 kg)  02/27/16 217 lb 13 oz (98.8 kg)    VITALS: BP 124/76   Pulse 61   Ht '5\' 6"'$  (1.676 m)   Wt 204 lb (92.5 kg)   BMI 32.93 kg/m   EXAM: General appearance: alert and no distress Neck: no carotid bruit and  no JVD Lungs: diminished breath sounds bilaterally Heart: regular rate and rhythm and S1, S2 normal Abdomen: soft, non-tender; bowel sounds normal; no masses,  no organomegaly Extremities: extremities normal, atraumatic, no cyanosis or edema Pulses: 2+ and symmetric Skin: Skin color, texture, turgor normal. No rashes or lesions Neurologic: Grossly normal PSych: Pleasant  EKG: NSR at 61  ASSESSMENT: 1. 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 Status post aortobifem bypass in 2002 4. PVCs 5. Hypertension 6. Dyslipidemia 7. COPD on oxygen 8.   Obstructive sleep apnea on CPAP 9.   Carotid artery stenosis bilaterally 10. GERD  PLAN: 1.  Jill Bryant denies any new chest pain or worsening shortness of breath. Her last echo was in 2016 which showed stable pericardial aortic valve. She will be due for repeat echo prior to her next visit in one year. She had recent carotid and lower extremity arterial Dopplers which are stable indicating no obstruction of her aortofemoral bypass which dated back to 2002. Overall she's feeling well. Blood pressure is at goal today per her cholesterol recently assessed by primary care providers at goal as well. Follow-up with me annually or sooner as necessary.  Pixie Casino, MD, [facility] Attending Cardiologist Vander C Terique Kawabata 01/15/2017, 1:48 PM

## 2017-01-15 NOTE — Patient Instructions (Signed)
Your physician recommends that you continue on your current medications as directed. Please refer to the Current Medication list given to you today.  Your physician has requested that you have an echocardiogram. Echocardiography is a painless test that uses sound waves to create images of your heart. It provides your doctor with information about the size and shape of your heart and how well your heart's chambers and valves are working. This procedure takes approximately one hour. There are no restrictions for this procedure. Please have this done at [address] in one year.  Your physician recommends that you schedule a follow-up appointment in 12 months with Dr. Debara Pickett after your echo

## 2017-02-12 ENCOUNTER — Ambulatory Visit: Payer: Medicare HMO | Admitting: Obstetrics

## 2017-03-01 ENCOUNTER — Telehealth: Payer: Self-pay | Admitting: Internal Medicine

## 2017-03-01 DIAGNOSIS — I779 Disorder of arteries and arterioles, unspecified: Secondary | ICD-10-CM

## 2017-03-01 DIAGNOSIS — H349 Unspecified retinal vascular occlusion: Secondary | ICD-10-CM

## 2017-03-01 DIAGNOSIS — I739 Peripheral vascular disease, unspecified: Secondary | ICD-10-CM

## 2017-03-01 NOTE — Telephone Encounter (Signed)
Received records from patient's opthalmologist. Dr. 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   Patient agrees w/MD plan. Test ordered. Staff message to F. Clark to schedule

## 2017-03-18 ENCOUNTER — Ambulatory Visit (HOSPITAL_COMMUNITY)
Admission: RE | Admit: 2017-03-18 | Discharge: 2017-03-18 | Disposition: A | Discharge status: Home / Self Care | Payer: Medicare HMO | Source: Ambulatory Visit | Attending: Cardiovascular Disease | Admitting: Cardiovascular Disease

## 2017-03-18 ENCOUNTER — Other Ambulatory Visit: Payer: Self-pay | Admitting: Internal Medicine

## 2017-03-18 DIAGNOSIS — I779 Disorder of arteries and arterioles, unspecified: Secondary | ICD-10-CM

## 2017-03-18 DIAGNOSIS — I2510 Atherosclerotic heart disease of native coronary artery without angina pectoris: Secondary | ICD-10-CM | POA: Diagnosis not present

## 2017-03-18 DIAGNOSIS — I739 Peripheral vascular disease, unspecified: Secondary | ICD-10-CM | POA: Insufficient documentation

## 2017-03-18 DIAGNOSIS — I10 Essential (primary) hypertension: Secondary | ICD-10-CM | POA: Insufficient documentation

## 2017-03-18 DIAGNOSIS — I6523 Occlusion and stenosis of bilateral carotid arteries: Secondary | ICD-10-CM | POA: Insufficient documentation

## 2017-03-18 DIAGNOSIS — J449 Chronic obstructive pulmonary disease, unspecified: Secondary | ICD-10-CM | POA: Diagnosis not present

## 2017-03-18 DIAGNOSIS — Z8673 Personal history of transient ischemic attack (TIA), and cerebral infarction without residual deficits: Secondary | ICD-10-CM | POA: Insufficient documentation

## 2017-03-18 DIAGNOSIS — E785 Hyperlipidemia, unspecified: Secondary | ICD-10-CM | POA: Insufficient documentation

## 2017-03-26 ENCOUNTER — Other Ambulatory Visit: Payer: Self-pay | Admitting: *Deleted

## 2017-03-26 DIAGNOSIS — I779 Disorder of arteries and arterioles, unspecified: Secondary | ICD-10-CM

## 2017-03-26 DIAGNOSIS — I739 Peripheral vascular disease, unspecified: Principal | ICD-10-CM

## 2018-01-07 ENCOUNTER — Other Ambulatory Visit (HOSPITAL_COMMUNITY): Payer: Medicare HMO

## 2018-01-10 ENCOUNTER — Other Ambulatory Visit: Payer: Self-pay

## 2018-01-10 ENCOUNTER — Ambulatory Visit (HOSPITAL_COMMUNITY): Payer: Medicare HMO | Attending: Cardiovascular Disease

## 2018-01-10 ENCOUNTER — Ambulatory Visit (HOSPITAL_COMMUNITY): Payer: Medicare HMO

## 2018-01-10 DIAGNOSIS — E039 Hypothyroidism, unspecified: Secondary | ICD-10-CM | POA: Diagnosis not present

## 2018-01-10 DIAGNOSIS — Z952 Presence of prosthetic heart valve: Secondary | ICD-10-CM | POA: Diagnosis not present

## 2018-01-10 DIAGNOSIS — Z8673 Personal history of transient ischemic attack (TIA), and cerebral infarction without residual deficits: Secondary | ICD-10-CM | POA: Insufficient documentation

## 2018-01-10 DIAGNOSIS — I509 Heart failure, unspecified: Secondary | ICD-10-CM | POA: Diagnosis not present

## 2018-01-10 DIAGNOSIS — D649 Anemia, unspecified: Secondary | ICD-10-CM | POA: Insufficient documentation

## 2018-01-10 DIAGNOSIS — I77810 Thoracic aortic ectasia: Secondary | ICD-10-CM | POA: Diagnosis not present

## 2018-01-10 DIAGNOSIS — I059 Rheumatic mitral valve disease, unspecified: Secondary | ICD-10-CM | POA: Diagnosis not present

## 2018-01-10 DIAGNOSIS — F419 Anxiety disorder, unspecified: Secondary | ICD-10-CM | POA: Diagnosis not present

## 2018-01-10 DIAGNOSIS — Z953 Presence of xenogenic heart valve: Secondary | ICD-10-CM | POA: Insufficient documentation

## 2018-01-10 DIAGNOSIS — G4730 Sleep apnea, unspecified: Secondary | ICD-10-CM | POA: Diagnosis not present

## 2018-01-10 DIAGNOSIS — I359 Nonrheumatic aortic valve disorder, unspecified: Secondary | ICD-10-CM | POA: Diagnosis present

## 2018-01-17 ENCOUNTER — Encounter (HOSPITAL_COMMUNITY): Payer: Self-pay

## 2018-01-17 ENCOUNTER — Other Ambulatory Visit (HOSPITAL_COMMUNITY): Payer: Medicare HMO

## 2018-02-03 ENCOUNTER — Ambulatory Visit: Payer: Medicare HMO | Admitting: Internal Medicine

## 2018-02-03 ENCOUNTER — Encounter: Payer: Self-pay | Admitting: Internal Medicine

## 2018-02-03 VITALS — BP 89/59 | HR 87 | Ht 67.0 in | Wt 212.2 lb

## 2018-02-03 DIAGNOSIS — Z95828 Presence of other vascular implants and grafts: Secondary | ICD-10-CM | POA: Diagnosis not present

## 2018-02-03 DIAGNOSIS — I712 Thoracic aortic aneurysm, without rupture, unspecified: Secondary | ICD-10-CM

## 2018-02-03 DIAGNOSIS — I951 Orthostatic hypotension: Secondary | ICD-10-CM | POA: Diagnosis not present

## 2018-02-03 DIAGNOSIS — I2510 Atherosclerotic heart disease of native coronary artery without angina pectoris: Secondary | ICD-10-CM | POA: Diagnosis not present

## 2018-02-03 NOTE — Progress Notes (Signed)
OFFICE NOTE  Chief Complaint:  No complaints  Primary Care Physician: Curt Jews, PA-C  HPI:  Jill Bryant is a pleasant 77 year old female with a list of about 40-50 different medical problems. [facility] of [facility] but due to new changes her insurance is no longer accepted there. She therefore sought out a new cardiologist in [place]. She actually lives in [place] but establish most of her [facility] [place] [facility] when she was originally living with family in that area. For a long time she lived in the [place] area. In 2002 she was diagnosed as having PAD and significant aortic occlusion. Ultimately she underwent aortobifem bypass by Dr. Thurnell Lose in 2002. 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 Jerelene Redden in [facility] She was present followed by Dr. Baxter Hire back in 2013 and also saw Dr. Joylene Grapes. She has significant COPD and is followed [facility]. She also has obstructive sleep apnea on CPAP and uses oxygen. She tells me she has stage III chronic kidney disease and a solitary kidney. She may also recently had an amaurosis fugax event. Currently she denies any chest pain. She reports stable shortness of breath with exertion. She denies palpitations but as noted have PVCs on her EKG today. She is not on beta blocker presumably due to her significant pulmonary disease and it is listed as an allergy. She told me that she had severe leg weakness previously on metoprolol.  I saw [name] back in the office today. She reports that since we saw each other she's had a couple of episodes of "angina". These episodes included chest discomfort for which she took 2  nitroglycerin and 3 baby aspirin. The symptoms did not resolve until about an hour later. She described it as a tightness in the center of her chest which radiated up center of her chest to her jaw. She does have a history of reflux and takes a Prilosec but the symptoms are somewhat different. To me, these episodes do not sound like angina. She has had a number of heart catheterizations, both in 2011 and is well as most recently in 2013. Neither study has demonstrated any obstructive coronary disease.  I saw [name] back today in follow-up. She is here for preoperative cardiovascular risk assessment. She is planning on undergoing lumbar spine surgery with Dr. Francesca Jewett. From a cardiac standpoint she's had no recurrent anginal symptoms. Her previous symptoms which were attributable to reflux have improved with reflux treatment. Both of her heart catheterizations in 2011 and 2013 showed no significant obstructive coronary disease. Recent echo last year showed normal function of her bioprosthetic aortic valve. She's also had carotid Dopplers which have been stable. She denies any angina or shortness of breath.  01/15/2017  [name] returns today for follow-up. She seems to be doing pretty well. In January she was sick with bronchitis/COPD exacerbation for most of the month however the last 2 months she's felt very well. She did undergo back surgery last year and she says it's helped significantly. She's had no new worsening chest pain or shortness of breath. In the fall last year she did see Rosaria Ferries, PA-C for chest pain. It essentially had resolved when she  saw her and no further testing was recommended. She ultimately did have her scheduled carotid and lower extremity Dopplers which were stable.  02/03/2018  [name] returns today for follow-up.  She is without complaints.  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  We have been following lower extremity arterial Dopplers with a history of aortobifem bypass.  She is also been on Lasix, however recently she is noted that she felt somewhat dried out.  Blood pressure today was surprisingly low at 89/59.  She said she felt fatigued today.  Her only blood pressure medicine is telmisartan.  She also has carotid artery disease which we continue to monitor and will have a repeat carotid Dopplers done in June 2019.  PMHx:  Past Medical History:  Diagnosis Date  . Anemia   . Anxiety   . Arthritis   . Asthma   . Back pain, chronic   . CHF (congestive heart failure) ([place])   . COPD (chronic obstructive pulmonary disease) ([place])   . Depression   . GERD (gastroesophageal reflux disease)   . Headache    migraines  . Hyperlipidemia   . Hypertension   . Hypothyroidism   . 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 Lumbar stenosis   . Pneumonia   . PONV (postoperative nausea and vomiting)   . Retinal tear    right eye  . Seizures ([place])    with morphine  . Shortness of breath dyspnea    with exertion  . Sleep apnea    wears CPAP  . TIA (transient ischemic attack)   . UTI (lower urinary tract infection)   . Wears glasses     Past Surgical History:  Procedure Laterality Date  . aortic endarterectomy    . aortobifemoral bypass    . BREAST SURGERY     cyst aspiration right breast  . CARDIAC SURGERY     valve replacement 2013; aorta replacement 2002  . CATARACT EXTRACTION W/ INTRAOCULAR LENS  IMPLANT, BILATERAL    . CHOLECYSTECTOMY    . COLONOSCOPY    . HERNIA REPAIR    . LUMBAR EPIDURAL INJECTION    . LUMBAR LAMINECTOMY/DECOMPRESSION MICRODISCECTOMY Left 02/27/2016   Procedure: Laminectomy and Foraminotomy - Lumbar four-five -Lumbar three-four - left diskectomy Lumbar three-four;  Surgeon: Kary Kos, MD;  Location: [facility];  Service: Neurosurgery;  Laterality: Left;  Marland Kitchen MULTIPLE TOOTH EXTRACTIONS    . TUBAL LIGATION        FAMHx:  Family History  Problem Relation Age of Onset  . Other Mother   . Alzheimer's disease Father     SOCHx:   reports that she quit smoking about 14 years ago. She has never used smokeless tobacco. She reports that she does not drink alcohol or use drugs.  ALLERGIES:  Allergies  Allergen Reactions  . Amlodipine Swelling    Feet/toes swelling  . Metoprolol Other (See Comments)    Bradycardia and fatigue  . Morphine And Related Other (See Comments)    Hallucinations/seizures  . Tape Rash    ROS: Pertinent items noted in HPI and remainder of comprehensive ROS otherwise negative.  HOME MEDS: Current Outpatient Medications  Medication Sig Dispense Refill  . acetaminophen (TYLENOL) 500 MG tablet Take 1,000 mg by mouth every 6 (six) hours as needed for moderate pain.    Marland Kitchen albuterol (ACCUNEB) 0.63 MG/3ML nebulizer  solution Take 1 ampule by nebulization 2 (two) times daily.    Marland Kitchen albuterol (PROVENTIL HFA;VENTOLIN HFA) 108 (90 BASE) MCG/ACT inhaler Inhale into the lungs as needed.    Marland Kitchen aspirin EC 81 MG tablet Take 81 mg by mouth daily.    . cephALEXin (KEFLEX) 250 MG capsule Take 250 mg by mouth daily.     . cetirizine (ZYRTEC) 10 MG tablet Take 10 mg by mouth daily as needed for allergies.     . diazepam (VALIUM) 5 MG tablet Take 5 mg by mouth every 6 (six) hours as needed for sedation.     Marland Kitchen FLUoxetine (PROZAC) 20 MG capsule Take 1 capsule by mouth daily.    . furosemide (LASIX) 40 MG tablet Take 40 mg by mouth at bedtime. TAKE TWO TABLETS AT NIGHT.    Marland Kitchen gabapentin (NEURONTIN) 300 MG capsule Take 600 mg by mouth at bedtime.     Marland Kitchen levothyroxine (SYNTHROID, LEVOTHROID) 100 MCG tablet TAKE 1 TABLET BY MOUTH ONCE DAILY AT  6AM    . nitroGLYCERIN (NITROSTAT) 0.4 MG SL tablet Place 1 tablet (0.4 mg total) under the tongue every 5 (five) minutes. 25 tablet 3  . Omega-3 1000 MG CAPS Take 1 capsule by mouth daily.    Marland Kitchen omeprazole (PRILOSEC) 20 MG capsule Take 20 mg by mouth daily.      . OXYGEN Inhale 2-4 L into the lungs at bedtime.    . pravastatin (PRAVACHOL) 20 MG tablet Take 20 mg by mouth at bedtime.    Marland Kitchen telmisartan (MICARDIS) 20 MG tablet Take 20 mg by mouth daily.    . Vitamin D, Ergocalciferol, (DRISDOL) 50000 units CAPS capsule Take 50,000 Units by mouth once a week.     No current facility-administered medications for this visit.     LABS/IMAGING: No results found for this or any previous visit (from the past 48 hour(s)). No results found.  WEIGHTS: Wt Readings from Last 3 Encounters:  02/03/18 212 lb 3.2 oz (96.3 kg)  01/15/17 204 lb (92.5 kg)  08/24/16 206 lb (93.4 kg)    VITALS: BP (!) 89/59   Pulse 87   Ht 5\' 7"  (1.702 m)   Wt 212 lb 3.2 oz (96.3 kg)   BMI 33.24 kg/m   EXAM: General appearance: alert and no distress Neck: no carotid bruit and no JVD Lungs: diminished breath sounds bilaterally Heart: regular rate and rhythm and S1, S2 normal Abdomen: soft, non-tender; bowel sounds normal; no masses,  no organomegaly Extremities: extremities normal, atraumatic, no cyanosis or edema Pulses: 2+ and symmetric Skin: Skin color, texture, turgor normal. No rashes or lesions Neurologic: Grossly normal PSych: Pleasant  EKG: Sinus rhythm at 87, RSR in V1, nonspecific ST and T wave changes-personally reviewed  ASSESSMENT: 1. 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 Status post aortobifem bypass in 2002 4. PVCs 5. Hypertension 6. Dyslipidemia 7. COPD on oxygen 8. Obstructive sleep apnea on CPAP 9. Carotid artery stenosis bilaterally 10. GERD  PLAN: 1.  Mrs. Frommer reports being fatigued today and is noted to be hypotensive.  She says she is somewhat dried out.  She is on Lasix but has normal LV function.  I recommend we stop that today she could use it as needed.  I do not appreciate any edema or volume overload.  She is due for repeat ultrasound of  her aortofemoral bypass.  She also needs repeat carotid Dopplers.  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  The gradients across her bioprosthetic valve are normal.  She is advised to monitor blood pressure at home and if her systolics are less than 694 could hold her telmisartan.  If this happens more frequently, she should contact our office and we may discontinue it.  Otherwise, I suspect if we stop her Lasix that she will recruit more volume and blood pressure will normalize.  Follow-up with me in 1 year sooner as necessary.  Pixie Casino, MD, [facility], [place] Director of the Advanced Lipid Disorders &  Cardiovascular Risk Reduction Clinic Diplomate of the American Board of Clinical Lipidology Attending Cardiologist  Direct Dial: [phone]  Fax: [phone]  Website:  [email or web address] 02/03/2018, 1:58 PM

## 2018-02-03 NOTE — Patient Instructions (Addendum)
Medication Instructions:   TAKE LASIX only as needed -- please check your BP at home - if your systolic (top number) is under 100 tomorrow 4/9 please hold Micardis -- if your BP continues to run low, please call by end of the week  Labwork:  NONE  Testing/Procedures:  CAROTID DOPPLER in June (already scheduled)  ABDOMINAL AORTA study to assess your aorto-bifemoral bypass.  This can be scheduled with your carotid doppler in June.   CT ANGIOGRAM OF CHEST/AORTA in ONE YEAR to assess thoracic aorta aneurysm   Follow-Up:  ONE YEAR with Dr. Theresa Duty will get a reminder letter in the mail about 2 months prior to when you are due for an appointment. Please call to schedule when you get the letter.   If you need a refill on your cardiac medications before your next appointment, please call your pharmacy.  Any Other Special Instructions Will Be Listed Below (If Applicable).    Your physician wants you to follow-up in: 1 year with Dr. Debara Pickett. You will receive a reminder letter in the mail two months in advance. If you don't receive a letter, please call our office to schedule the follow-up appointment.

## 2018-03-06 ENCOUNTER — Other Ambulatory Visit: Payer: Self-pay | Admitting: Internal Medicine

## 2018-03-06 DIAGNOSIS — Z95828 Presence of other vascular implants and grafts: Secondary | ICD-10-CM

## 2018-03-31 ENCOUNTER — Ambulatory Visit (HOSPITAL_COMMUNITY)
Admission: RE | Admit: 2018-03-31 | Discharge: 2018-03-31 | Disposition: A | Discharge status: Home / Self Care | Payer: Medicare HMO | Source: Ambulatory Visit | Attending: Internal Medicine | Admitting: Internal Medicine

## 2018-03-31 ENCOUNTER — Other Ambulatory Visit: Payer: Self-pay | Admitting: Internal Medicine

## 2018-03-31 DIAGNOSIS — I739 Peripheral vascular disease, unspecified: Secondary | ICD-10-CM | POA: Insufficient documentation

## 2018-03-31 DIAGNOSIS — I10 Essential (primary) hypertension: Secondary | ICD-10-CM | POA: Diagnosis not present

## 2018-03-31 DIAGNOSIS — Z8673 Personal history of transient ischemic attack (TIA), and cerebral infarction without residual deficits: Secondary | ICD-10-CM | POA: Insufficient documentation

## 2018-03-31 DIAGNOSIS — Z87891 Personal history of nicotine dependence: Secondary | ICD-10-CM | POA: Insufficient documentation

## 2018-03-31 DIAGNOSIS — E785 Hyperlipidemia, unspecified: Secondary | ICD-10-CM | POA: Insufficient documentation

## 2018-03-31 DIAGNOSIS — Z95828 Presence of other vascular implants and grafts: Secondary | ICD-10-CM | POA: Insufficient documentation

## 2018-03-31 DIAGNOSIS — I779 Disorder of arteries and arterioles, unspecified: Secondary | ICD-10-CM | POA: Insufficient documentation

## 2018-03-31 DIAGNOSIS — J449 Chronic obstructive pulmonary disease, unspecified: Secondary | ICD-10-CM | POA: Insufficient documentation

## 2018-03-31 DIAGNOSIS — I2510 Atherosclerotic heart disease of native coronary artery without angina pectoris: Secondary | ICD-10-CM | POA: Diagnosis not present

## 2018-04-01 ENCOUNTER — Other Ambulatory Visit: Payer: Self-pay | Admitting: *Deleted

## 2018-04-01 DIAGNOSIS — Z95828 Presence of other vascular implants and grafts: Secondary | ICD-10-CM

## 2018-04-01 DIAGNOSIS — I779 Disorder of arteries and arterioles, unspecified: Secondary | ICD-10-CM

## 2018-04-01 DIAGNOSIS — I739 Peripheral vascular disease, unspecified: Secondary | ICD-10-CM

## 2018-05-04 ENCOUNTER — Other Ambulatory Visit: Payer: Self-pay

## 2018-05-04 ENCOUNTER — Emergency Department (HOSPITAL_COMMUNITY)
Admission: EM | Admit: 2018-05-04 | Discharge: 2018-05-04 | Disposition: A | Discharge status: Home / Self Care | Payer: Medicare HMO | Attending: Emergency Medicine | Admitting: Emergency Medicine

## 2018-05-04 ENCOUNTER — Encounter (HOSPITAL_COMMUNITY): Payer: Self-pay

## 2018-05-04 ENCOUNTER — Emergency Department (HOSPITAL_COMMUNITY): Payer: Medicare HMO

## 2018-05-04 DIAGNOSIS — I2510 Atherosclerotic heart disease of native coronary artery without angina pectoris: Secondary | ICD-10-CM | POA: Insufficient documentation

## 2018-05-04 DIAGNOSIS — Z87891 Personal history of nicotine dependence: Secondary | ICD-10-CM | POA: Insufficient documentation

## 2018-05-04 DIAGNOSIS — R0602 Shortness of breath: Secondary | ICD-10-CM | POA: Diagnosis not present

## 2018-05-04 DIAGNOSIS — I509 Heart failure, unspecified: Secondary | ICD-10-CM | POA: Insufficient documentation

## 2018-05-04 DIAGNOSIS — J189 Pneumonia, unspecified organism: Secondary | ICD-10-CM

## 2018-05-04 DIAGNOSIS — I110 Hypertensive heart disease with heart failure: Secondary | ICD-10-CM | POA: Diagnosis not present

## 2018-05-04 DIAGNOSIS — J181 Lobar pneumonia, unspecified organism: Secondary | ICD-10-CM | POA: Insufficient documentation

## 2018-05-04 DIAGNOSIS — J449 Chronic obstructive pulmonary disease, unspecified: Secondary | ICD-10-CM | POA: Insufficient documentation

## 2018-05-04 DIAGNOSIS — R42 Dizziness and giddiness: Secondary | ICD-10-CM | POA: Diagnosis not present

## 2018-05-04 DIAGNOSIS — Z79899 Other long term (current) drug therapy: Secondary | ICD-10-CM | POA: Diagnosis not present

## 2018-05-04 LAB — CBC
HCT: 37.3 % (ref 36.0–46.0)
Hemoglobin: 11.4 g/dL — ABNORMAL LOW (ref 12.0–15.0)
MCH: 26.2 pg (ref 26.0–34.0)
MCHC: 30.6 g/dL (ref 30.0–36.0)
MCV: 85.7 fL (ref 78.0–100.0)
PLATELETS: 225 10*3/uL (ref 150–400)
RBC: 4.35 MIL/uL (ref 3.87–5.11)
RDW: 17.2 % — AB (ref 11.5–15.5)
WBC: 8.3 10*3/uL (ref 4.0–10.5)

## 2018-05-04 LAB — BASIC METABOLIC PANEL
Anion gap: 11 (ref 5–15)
BUN: 19 mg/dL (ref 8–23)
CHLORIDE: 100 mmol/L (ref 98–111)
CO2: 24 mmol/L (ref 22–32)
CREATININE: 1.56 mg/dL — AB (ref 0.44–1.00)
Calcium: 9.1 mg/dL (ref 8.9–10.3)
GFR calc Af Amer: 35 mL/min — ABNORMAL LOW (ref >60)
GFR calc non Af Amer: 30 mL/min — ABNORMAL LOW (ref >60)
GLUCOSE: 91 mg/dL (ref 70–99)
Potassium: 4.1 mmol/L (ref 3.5–5.1)
Sodium: 135 mmol/L (ref 135–145)

## 2018-05-04 LAB — TROPONIN I: Troponin I: <0.03 ng/mL (ref <0.03)

## 2018-05-04 LAB — BRAIN NATRIURETIC PEPTIDE: B Natriuretic Peptide: 80.2 pg/mL (ref 0.0–100.0)

## 2018-05-04 MED ORDER — LEVOFLOXACIN 500 MG PO TABS
500.0000 mg | ORAL_TABLET | Freq: Once | ORAL | Status: AC
  Administered 2018-05-04: 500 mg via ORAL
  Filled 2018-05-04: qty 1

## 2018-05-04 MED ORDER — LEVOFLOXACIN 500 MG PO TABS: 500.0000 mg | ORAL_TABLET | Freq: Every day | ORAL | 0 refills | Status: DC

## 2018-05-04 NOTE — ED Notes (Signed)
Pt satting at 88% on room air after scooting herself up in bed, placed on 1L via [place].

## 2018-05-04 NOTE — ED Provider Notes (Signed)
[facility] EMERGENCY DEPARTMENT Provider Note   CSN: [id number] Arrival date & time: 05/04/18  1617     History   Chief Complaint Chief Complaint  Patient presents with  . Dizziness  . Shortness of Breath    HPI Jill Bryant is a 77 y.o. female.  HPI Patient presents with concern of dizziness and dyspnea. Patient has baseline dyspnea, but notes that worse she can typically walk without difficulty, and today, refusing a few steps she has winded. She denies chest pain or lightheadedness, or syncope. No recent medication change, diet change per She acknowledges a long history of CAD, CHF, pulmonary disease.  At rest the patient states that she feels fine Past Medical History:  Diagnosis Date  . Anemia   . Anxiety   . Arthritis   . Asthma   . Back pain, chronic   . CHF (congestive heart failure) ([place])   . COPD (chronic obstructive pulmonary disease) ([place])   . Depression   . GERD (gastroesophageal reflux disease)   . Headache    migraines  . Hyperlipidemia   . Hypertension   . Hypothyroidism   . 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 Lumbar stenosis   . Pneumonia   . PONV (postoperative nausea and vomiting)   . Retinal tear    right eye  . Seizures ([place])    with morphine  . Shortness of breath dyspnea    with exertion  . Sleep apnea    wears CPAP  . TIA (transient ischemic attack)   . UTI (lower urinary tract infection)   . Wears glasses     Patient Active Problem List   Diagnosis Date Noted  . Thoracic aortic aneurysm without rupture ([place]) 02/03/2018  . Orthostatic hypotension 02/03/2018  . HNP (herniated nucleus pulposus), lumbar 02/27/2016  . Preoperative cardiovascular examination 01/23/2016  . GERD (gastroesophageal reflux disease) 04/27/2015  . S/P aorto-bifemoral bypass surgery 02/23/2015  . S/P AVR (aortic valve replacement) 02/23/2015  . CAD (coronary artery disease) 02/23/2015  . PVC's (premature ventricular  contractions) 02/23/2015  . Carotid artery disease ([place]) 02/23/2015  . COPD, severity to be determined ([place]) 02/23/2015  . OSA on CPAP 02/23/2015  . Injury of third finger, right 03/29/2014    Past Surgical History:  Procedure Laterality Date  . aortic endarterectomy    . aortobifemoral bypass    . BREAST SURGERY     cyst aspiration right breast  . CARDIAC SURGERY     valve replacement 2013; aorta replacement 2002  . CATARACT EXTRACTION W/ INTRAOCULAR LENS  IMPLANT, BILATERAL    . CHOLECYSTECTOMY    . COLONOSCOPY    . HERNIA REPAIR    . LUMBAR EPIDURAL INJECTION    . LUMBAR LAMINECTOMY/DECOMPRESSION MICRODISCECTOMY Left 02/27/2016   Procedure: Laminectomy and Foraminotomy - Lumbar four-five -Lumbar three-four - left diskectomy Lumbar three-four;  Surgeon: Kary Kos, MD;  Location: [facility];  Service: Neurosurgery;  Laterality: Left;  Marland Kitchen MULTIPLE TOOTH EXTRACTIONS    . TUBAL LIGATION       OB History   None      Home Medications    Prior to Admission medications   Medication Sig Start Date End Date Taking? Authorizing Provider  acetaminophen (TYLENOL) 500 MG tablet Take 1,000 mg by mouth every 6 (six) hours as needed for moderate pain.   Yes [provider]  albuterol (ACCUNEB) 0.63 MG/3ML nebulizer solution Take 1 ampule by nebulization 2 (two) times daily.  Yes [provider]  albuterol (PROVENTIL HFA;VENTOLIN HFA) 108 (90 BASE) MCG/ACT inhaler Inhale into the lungs as needed.   Yes [provider]  aspirin EC 81 MG tablet Take 81 mg by mouth daily.   Yes [provider]  cephALEXin (KEFLEX) 250 MG capsule Take 250 mg by mouth daily.    Yes [provider]  cetirizine (ZYRTEC) 10 MG tablet Take 10 mg by mouth daily as needed for allergies.    Yes [provider]  cholecalciferol (VITAMIN D) 1000 units tablet Take 1,000 Units by mouth 2 (two) times daily.   Yes [provider]  diazepam (VALIUM) 5 MG tablet  Take 5 mg by mouth every 6 (six) hours as needed for sedation.    Yes [provider]  ferrous sulfate 325 (65 FE) MG EC tablet Take 325 mg by mouth daily. 04/21/18  Yes [provider]  FLUoxetine (PROZAC) 20 MG capsule Take 20 mg by mouth daily.  04/08/14  Yes [provider]  furosemide (LASIX) 40 MG tablet Take 40 mg by mouth daily as needed for fluid or edema.    Yes [provider]  gabapentin (NEURONTIN) 300 MG capsule Take 600 mg by mouth at bedtime.  03/18/14  Yes [provider]  levothyroxine (SYNTHROID, LEVOTHROID) 100 MCG tablet TAKE 1 TABLET BY MOUTH ONCE DAILY AT  6AM 12/27/17  Yes [provider]  Omega-3 1000 MG CAPS Take 1 capsule by mouth daily.   Yes [provider]  omeprazole (PRILOSEC) 20 MG capsule Take 20 mg by mouth daily.  03/18/14  Yes [provider]  OXYGEN Inhale 2-4 L into the lungs at bedtime.   Yes [provider]  pravastatin (PRAVACHOL) 20 MG tablet Take 20 mg by mouth at bedtime.   Yes [provider]  telmisartan (MICARDIS) 20 MG tablet Take 20 mg by mouth daily.   Yes [provider]  vitamin B-12 (CYANOCOBALAMIN) 1000 MCG tablet Take 1,000 mcg by mouth daily.   Yes [provider]  nitroGLYCERIN (NITROSTAT) 0.4 MG SL tablet Place 1 tablet (0.4 mg total) under the tongue every 5 (five) minutes. 04/27/15   Hilty, Nadean Corwin, MD    Family History Family History  Problem Relation Age of Onset  . Other Mother   . Alzheimer's disease Father     Social History Social History   Tobacco Use  . Smoking status: Former Smoker    Last attempt to quit: 10/30/2003    Years since quitting: 14.5  . Smokeless tobacco: Never Used  Substance Use Topics  . Alcohol use: No  . Drug use: No     Allergies   Amlodipine; Metoprolol; Morphine and related; and Tape   Review of Systems Review of Systems  Constitutional:       Per HPI, otherwise negative  HENT:        Per HPI, otherwise negative  Respiratory:       Per HPI, otherwise negative  Cardiovascular:       Per HPI, otherwise negative  Gastrointestinal: Negative for vomiting.  Endocrine:       Negative aside from HPI  Genitourinary:       Neg aside from HPI   Musculoskeletal:       Per HPI, otherwise negative  Skin: Negative.   Neurological: Negative for syncope.     Physical Exam Updated Vital Signs BP (!) 150/51 (BP Location: Right Arm)   Pulse 65   Temp 98.3  F (36.8 C) (Oral)   Resp (!) 22 Comment: while talking  Ht 5\' 7"  (1.702 m)   Wt 90.7 kg (200 lb)   SpO2 93%   BMI 31.32 kg/m   Physical Exam  Constitutional: She is oriented to person, place, and time. She appears well-developed and well-nourished. No distress.  HENT:  Head: Normocephalic and atraumatic.  Eyes: Conjunctivae and EOM are normal.  Cardiovascular: Normal rate and regular rhythm.  Pulmonary/Chest: Effort normal. No stridor. No respiratory distress. She has decreased breath sounds.  Abdominal: She exhibits no distension.  Musculoskeletal: She exhibits no edema.  Neurological: She is alert and oriented to person, place, and time. No cranial nerve deficit.  Skin: Skin is warm and dry.  Psychiatric: She has a normal mood and affect.  Nursing note and vitals reviewed.    ED Treatments / Results  Labs (all labs ordered are listed, but only abnormal results are displayed) Labs Reviewed  BASIC METABOLIC PANEL - Abnormal; Notable for the following components:      Result Value   Creatinine, Ser 1.56 (*)    GFR calc non Af Amer 30 (*)    GFR calc Af Amer 35 (*)    All other components within normal limits  CBC - Abnormal; Notable for the following components:   Hemoglobin 11.4 (*)    RDW 17.2 (*)    All other components within normal limits  BRAIN NATRIURETIC PEPTIDE  TROPONIN I  URINALYSIS, ROUTINE W REFLEX MICROSCOPIC    EKG EKG Interpretation  Date/Time:  Sunday May 04 2018 16:29:49  EDT Ventricular Rate:  63 PR Interval:    QRS Duration: 111 QT Interval:  420 QTC Calculation: 430 R Axis:   3 Text Interpretation:  Sinus rhythm Prolonged PR interval Abnormal R-wave progression, early transition Confirmed by Carmin Muskrat [phone]) on 05/04/2018 5:23:34 PM   Radiology Dg Chest 2 View  Result Date: 05/04/2018 CLINICAL DATA:  Dizziness and shortness of breath EXAM: CHEST - 2 VIEW COMPARISON:  July 22, 2017 FINDINGS: There is focal opacity on the lateral view which was not present in September of 2018. This may correlate with the infiltrate seen on the CT scan February of 2019 in the medial right base. No other interval changes or acute abnormalities. IMPRESSION: There is focal opacity in 1 of the lungs on the lateral view, likely within the right lower lobe. Recommend follow-up to resolution. Electronically Signed   By: Dorise Bullion III M.D   On: 05/04/2018 17:28    Procedures Procedures (including critical care time)  Medications Ordered in ED According  Initial Impression / Assessment and Plan / ED Course  I have reviewed the triage vital signs and the nursing notes.  Pertinent labs & imaging results that were available during my care of the patient were reviewed by me and considered in my medical decision making (see chart for details).     8:16 PM Patient awake alert, calm, states that she feels better. We had a lengthy conversation about all findings with her son present. T she now recalls feeling poorly yesterday, with a minor cough, and given the abnormal x-ray, there is some suspicion for early pneumonia. Patient is awake, alert, in no distress, will be discharged with close outpatient follow-up, to which she is amenable. Patient started on antibiotics, discharged in stable condition.  Final Clinical Impressions(s) / ED Diagnoses  Right middle lobe pneumonia   Carmin Muskrat, MD 05/04/18 2029

## 2018-05-04 NOTE — Discharge Instructions (Addendum)
Please be sure to schedule follow-up with your primary care physician.  Return here for concerning changes in your condition.

## 2018-05-04 NOTE — ED Notes (Signed)
ED Provider at bedside. 

## 2018-05-04 NOTE — ED Notes (Signed)
Patient verbalizes understanding of discharge instructions. Opportunity for questioning and answers were provided. Armband removed by staff, pt discharged from ED ambulatory.   

## 2018-05-04 NOTE — ED Triage Notes (Signed)
Pt arrives to ED from home with complaints of dizziness and shortness of breath with exertion since this morning. EMS reports pt has hx of CHF and was recently taken off her lasix. pt also has COPD, wears 2L O2 at night only. Pt 88% on room air with EMS, placed on 2L and sats up to 98%. Pt placed in position of comfort with bed locked and lowered, call bell in reach.

## 2018-10-10 ENCOUNTER — Emergency Department (HOSPITAL_COMMUNITY): Payer: Medicare HMO

## 2018-10-10 ENCOUNTER — Encounter (HOSPITAL_COMMUNITY): Payer: Self-pay | Admitting: Internal Medicine

## 2018-10-10 ENCOUNTER — Observation Stay (HOSPITAL_COMMUNITY)
Admission: EM | Admit: 2018-10-10 | Discharge: 2018-10-14 | Disposition: A | Discharge status: Home / Self Care | Payer: Medicare HMO | Attending: Family Medicine | Admitting: Family Medicine

## 2018-10-10 ENCOUNTER — Other Ambulatory Visit: Payer: Self-pay

## 2018-10-10 DIAGNOSIS — Z8673 Personal history of transient ischemic attack (TIA), and cerebral infarction without residual deficits: Secondary | ICD-10-CM | POA: Insufficient documentation

## 2018-10-10 DIAGNOSIS — G454 Transient global amnesia: Secondary | ICD-10-CM | POA: Insufficient documentation

## 2018-10-10 DIAGNOSIS — E039 Hypothyroidism, unspecified: Secondary | ICD-10-CM | POA: Insufficient documentation

## 2018-10-10 DIAGNOSIS — E876 Hypokalemia: Secondary | ICD-10-CM | POA: Diagnosis present

## 2018-10-10 DIAGNOSIS — S0990XA Unspecified injury of head, initial encounter: Secondary | ICD-10-CM

## 2018-10-10 DIAGNOSIS — R0602 Shortness of breath: Secondary | ICD-10-CM

## 2018-10-10 DIAGNOSIS — E669 Obesity, unspecified: Secondary | ICD-10-CM | POA: Diagnosis not present

## 2018-10-10 DIAGNOSIS — M25569 Pain in unspecified knee: Secondary | ICD-10-CM | POA: Insufficient documentation

## 2018-10-10 DIAGNOSIS — R55 Syncope and collapse: Principal | ICD-10-CM

## 2018-10-10 DIAGNOSIS — M546 Pain in thoracic spine: Secondary | ICD-10-CM | POA: Insufficient documentation

## 2018-10-10 DIAGNOSIS — G4733 Obstructive sleep apnea (adult) (pediatric): Secondary | ICD-10-CM | POA: Diagnosis not present

## 2018-10-10 DIAGNOSIS — R7989 Other specified abnormal findings of blood chemistry: Secondary | ICD-10-CM | POA: Diagnosis not present

## 2018-10-10 DIAGNOSIS — Z87891 Personal history of nicotine dependence: Secondary | ICD-10-CM | POA: Diagnosis not present

## 2018-10-10 DIAGNOSIS — I272 Pulmonary hypertension, unspecified: Secondary | ICD-10-CM

## 2018-10-10 DIAGNOSIS — R9389 Abnormal findings on diagnostic imaging of other specified body structures: Secondary | ICD-10-CM | POA: Insufficient documentation

## 2018-10-10 DIAGNOSIS — Z7982 Long term (current) use of aspirin: Secondary | ICD-10-CM | POA: Insufficient documentation

## 2018-10-10 DIAGNOSIS — R296 Repeated falls: Secondary | ICD-10-CM

## 2018-10-10 DIAGNOSIS — J45909 Unspecified asthma, uncomplicated: Secondary | ICD-10-CM | POA: Insufficient documentation

## 2018-10-10 DIAGNOSIS — Z95828 Presence of other vascular implants and grafts: Secondary | ICD-10-CM

## 2018-10-10 DIAGNOSIS — I2510 Atherosclerotic heart disease of native coronary artery without angina pectoris: Secondary | ICD-10-CM | POA: Diagnosis not present

## 2018-10-10 DIAGNOSIS — I214 Non-ST elevation (NSTEMI) myocardial infarction: Secondary | ICD-10-CM | POA: Diagnosis present

## 2018-10-10 DIAGNOSIS — Z952 Presence of prosthetic heart valve: Secondary | ICD-10-CM

## 2018-10-10 DIAGNOSIS — I10 Essential (primary) hypertension: Secondary | ICD-10-CM | POA: Diagnosis not present

## 2018-10-10 DIAGNOSIS — J449 Chronic obstructive pulmonary disease, unspecified: Secondary | ICD-10-CM | POA: Diagnosis not present

## 2018-10-10 DIAGNOSIS — I712 Thoracic aortic aneurysm, without rupture, unspecified: Secondary | ICD-10-CM | POA: Diagnosis present

## 2018-10-10 DIAGNOSIS — Z79899 Other long term (current) drug therapy: Secondary | ICD-10-CM | POA: Diagnosis not present

## 2018-10-10 DIAGNOSIS — R778 Other specified abnormalities of plasma proteins: Secondary | ICD-10-CM | POA: Diagnosis present

## 2018-10-10 DIAGNOSIS — I493 Ventricular premature depolarization: Secondary | ICD-10-CM | POA: Diagnosis present

## 2018-10-10 DIAGNOSIS — Z9989 Dependence on other enabling machines and devices: Secondary | ICD-10-CM

## 2018-10-10 DIAGNOSIS — Z9289 Personal history of other medical treatment: Secondary | ICD-10-CM

## 2018-10-10 LAB — CBC
HCT: 41.2 % (ref 36.0–46.0)
HCT: 37 % (ref 36.0–46.0)
Hemoglobin: 12.6 g/dL (ref 12.0–15.0)
Hemoglobin: 11.3 g/dL — ABNORMAL LOW (ref 12.0–15.0)
MCH: 28.8 pg (ref 26.0–34.0)
MCH: 28.6 pg (ref 26.0–34.0)
MCHC: 30.6 g/dL (ref 30.0–36.0)
MCHC: 30.5 g/dL (ref 30.0–36.0)
MCV: 94.3 fL (ref 80.0–100.0)
MCV: 93.7 fL (ref 80.0–100.0)
NRBC: 0 % (ref 0.0–0.2)
PLATELETS: 196 10*3/uL (ref 150–400)
PLATELETS: 180 10*3/uL (ref 150–400)
RBC: 4.37 MIL/uL (ref 3.87–5.11)
RBC: 3.95 MIL/uL (ref 3.87–5.11)
RDW: 13.9 % (ref 11.5–15.5)
RDW: 13.8 % (ref 11.5–15.5)
WBC: 7.7 10*3/uL (ref 4.0–10.5)
WBC: 6.1 10*3/uL (ref 4.0–10.5)
nRBC: 0 % (ref 0.0–0.2)

## 2018-10-10 LAB — COMPREHENSIVE METABOLIC PANEL
ALBUMIN: 3.2 g/dL — AB (ref 3.5–5.0)
ALT: 12 U/L (ref 0–44)
AST: 24 U/L (ref 15–41)
Alkaline Phosphatase: 36 U/L — ABNORMAL LOW (ref 38–126)
Anion gap: 13 (ref 5–15)
BUN: 13 mg/dL (ref 8–23)
CHLORIDE: 102 mmol/L (ref 98–111)
CO2: 25 mmol/L (ref 22–32)
CREATININE: 1.44 mg/dL — AB (ref 0.44–1.00)
Calcium: 9.2 mg/dL (ref 8.9–10.3)
GFR calc Af Amer: 40 mL/min — ABNORMAL LOW (ref >60)
GFR, EST NON AFRICAN AMERICAN: 34 mL/min — AB (ref >60)
GLUCOSE: 84 mg/dL (ref 70–99)
Potassium: 3.6 mmol/L (ref 3.5–5.1)
Sodium: 140 mmol/L (ref 135–145)
Total Bilirubin: 1 mg/dL (ref 0.3–1.2)
Total Protein: 6.5 g/dL (ref 6.5–8.1)

## 2018-10-10 LAB — URINALYSIS, ROUTINE W REFLEX MICROSCOPIC
Bilirubin Urine: NEGATIVE
Glucose, UA: NEGATIVE mg/dL
Hgb urine dipstick: NEGATIVE
Ketones, ur: NEGATIVE mg/dL
Nitrite: NEGATIVE
PH: 6 (ref 5.0–8.0)
Protein, ur: NEGATIVE mg/dL
Specific Gravity, Urine: 1.006 (ref 1.005–1.030)

## 2018-10-10 LAB — CREATININE, SERUM
Creatinine, Ser: 1.49 mg/dL — ABNORMAL HIGH (ref 0.44–1.00)
GFR calc Af Amer: 38 mL/min — ABNORMAL LOW (ref >60)
GFR calc non Af Amer: 33 mL/min — ABNORMAL LOW (ref >60)

## 2018-10-10 LAB — CBG MONITORING, ED: Glucose-Capillary: 74 mg/dL (ref 70–99)

## 2018-10-10 LAB — TROPONIN I
TROPONIN I: 0.42 ng/mL — AB (ref <0.03)
Troponin I: 0.34 ng/mL (ref <0.03)

## 2018-10-10 LAB — TSH: TSH: 1.664 u[IU]/mL (ref 0.350–4.500)

## 2018-10-10 MED ORDER — GABAPENTIN 300 MG PO CAPS
600.0000 mg | ORAL_CAPSULE | Freq: Every day | ORAL | Status: DC
  Administered 2018-10-10 – 2018-10-13 (×4): 600 mg via ORAL
  Filled 2018-10-10 (×4): qty 2

## 2018-10-10 MED ORDER — DIAZEPAM 5 MG PO TABS: 5.0000 mg | ORAL_TABLET | Freq: Two times a day (BID) | ORAL | Status: DC | PRN

## 2018-10-10 MED ORDER — PRAVASTATIN SODIUM 10 MG PO TABS
20.0000 mg | ORAL_TABLET | Freq: Every day | ORAL | Status: DC
  Administered 2018-10-10 – 2018-10-13 (×4): 20 mg via ORAL
  Filled 2018-10-10 (×4): qty 2

## 2018-10-10 MED ORDER — VITAMIN D 25 MCG (1000 UNIT) PO TABS
2000.0000 [IU] | ORAL_TABLET | Freq: Every day | ORAL | Status: DC
  Administered 2018-10-11 – 2018-10-14 (×4): 2000 [IU] via ORAL
  Filled 2018-10-10 (×4): qty 2

## 2018-10-10 MED ORDER — LORATADINE 10 MG PO TABS
10.0000 mg | ORAL_TABLET | Freq: Every day | ORAL | Status: DC
  Administered 2018-10-11 – 2018-10-14 (×4): 10 mg via ORAL
  Filled 2018-10-10 (×4): qty 1

## 2018-10-10 MED ORDER — ALBUTEROL SULFATE HFA 108 (90 BASE) MCG/ACT IN AERS: 2.0000 | INHALATION_SPRAY | Freq: Four times a day (QID) | RESPIRATORY_TRACT | Status: DC | PRN

## 2018-10-10 MED ORDER — IRBESARTAN 150 MG PO TABS
75.0000 mg | ORAL_TABLET | Freq: Every day | ORAL | Status: DC
  Administered 2018-10-11 – 2018-10-14 (×4): 75 mg via ORAL
  Filled 2018-10-10 (×4): qty 1

## 2018-10-10 MED ORDER — OMEGA-3-ACID ETHYL ESTERS 1 G PO CAPS
1000.0000 mg | ORAL_CAPSULE | Freq: Every day | ORAL | Status: DC
  Administered 2018-10-11 – 2018-10-14 (×4): 1000 mg via ORAL
  Filled 2018-10-10 (×4): qty 1

## 2018-10-10 MED ORDER — NITROGLYCERIN 0.4 MG SL SUBL: 0.4000 mg | SUBLINGUAL_TABLET | SUBLINGUAL | Status: DC | PRN

## 2018-10-10 MED ORDER — ACETAMINOPHEN 325 MG PO TABS
650.0000 mg | ORAL_TABLET | Freq: Four times a day (QID) | ORAL | Status: DC | PRN
  Administered 2018-10-12 – 2018-10-13 (×2): 650 mg via ORAL
  Filled 2018-10-10 (×2): qty 2

## 2018-10-10 MED ORDER — ASPIRIN 81 MG PO CHEW
324.0000 mg | CHEWABLE_TABLET | Freq: Once | ORAL | Status: AC
  Administered 2018-10-10: 324 mg via ORAL
  Filled 2018-10-10: qty 4

## 2018-10-10 MED ORDER — ACETAMINOPHEN 650 MG RE SUPP: 650.0000 mg | Freq: Four times a day (QID) | RECTAL | Status: DC | PRN

## 2018-10-10 MED ORDER — ALBUTEROL SULFATE (2.5 MG/3ML) 0.083% IN NEBU: 3.0000 mL | INHALATION_SOLUTION | Freq: Two times a day (BID) | RESPIRATORY_TRACT | Status: DC | PRN

## 2018-10-10 MED ORDER — VITAMIN B-12 1000 MCG PO TABS
1000.0000 ug | ORAL_TABLET | Freq: Every day | ORAL | Status: DC
  Administered 2018-10-11 – 2018-10-14 (×4): 1000 ug via ORAL
  Filled 2018-10-10 (×4): qty 1

## 2018-10-10 MED ORDER — LEVOTHYROXINE SODIUM 100 MCG PO TABS
100.0000 ug | ORAL_TABLET | ORAL | Status: DC
  Administered 2018-10-11 – 2018-10-14 (×4): 100 ug via ORAL
  Filled 2018-10-10 (×5): qty 1

## 2018-10-10 MED ORDER — ONDANSETRON HCL 4 MG/2ML IJ SOLN: 4.0000 mg | Freq: Four times a day (QID) | INTRAMUSCULAR | Status: DC | PRN

## 2018-10-10 MED ORDER — FERROUS SULFATE 325 (65 FE) MG PO TABS
325.0000 mg | ORAL_TABLET | Freq: Every day | ORAL | Status: DC
  Administered 2018-10-11 – 2018-10-14 (×4): 325 mg via ORAL
  Filled 2018-10-10 (×4): qty 1

## 2018-10-10 MED ORDER — SODIUM CHLORIDE 0.9 % IV SOLN
INTRAVENOUS | Status: DC
  Administered 2018-10-10: 20:00:00 via INTRAVENOUS

## 2018-10-10 MED ORDER — PANTOPRAZOLE SODIUM 40 MG PO TBEC
40.0000 mg | DELAYED_RELEASE_TABLET | Freq: Every day | ORAL | Status: DC
  Administered 2018-10-11 – 2018-10-14 (×4): 40 mg via ORAL
  Filled 2018-10-10 (×4): qty 1

## 2018-10-10 MED ORDER — ENOXAPARIN SODIUM 40 MG/0.4ML ~~LOC~~ SOLN
40.0000 mg | SUBCUTANEOUS | Status: DC
  Administered 2018-10-10 – 2018-10-13 (×4): 40 mg via SUBCUTANEOUS
  Filled 2018-10-10 (×3): qty 0.4

## 2018-10-10 MED ORDER — ONDANSETRON HCL 4 MG PO TABS: 4.0000 mg | ORAL_TABLET | Freq: Four times a day (QID) | ORAL | Status: DC | PRN

## 2018-10-10 MED ORDER — ASPIRIN EC 81 MG PO TBEC
81.0000 mg | DELAYED_RELEASE_TABLET | Freq: Every day | ORAL | Status: DC
  Administered 2018-10-11 – 2018-10-14 (×4): 81 mg via ORAL
  Filled 2018-10-10 (×4): qty 1

## 2018-10-10 MED ORDER — UMECLIDINIUM BROMIDE 62.5 MCG/INH IN AEPB
1.0000 | INHALATION_SPRAY | Freq: Every day | RESPIRATORY_TRACT | Status: DC
  Administered 2018-10-11 – 2018-10-14 (×4): 1 via RESPIRATORY_TRACT
  Filled 2018-10-10: qty 7

## 2018-10-10 MED ORDER — FLUOXETINE HCL 20 MG PO CAPS
20.0000 mg | ORAL_CAPSULE | Freq: Every day | ORAL | Status: DC
  Administered 2018-10-11 – 2018-10-14 (×4): 20 mg via ORAL
  Filled 2018-10-10 (×4): qty 1

## 2018-10-10 MED ORDER — SODIUM CHLORIDE 0.9 % IV SOLN
INTRAVENOUS | Status: AC
  Administered 2018-10-11: 07:00:00 via INTRAVENOUS

## 2018-10-10 MED ORDER — CALCIUM CARBONATE 1250 (500 CA) MG PO TABS
1250.0000 mg | ORAL_TABLET | Freq: Two times a day (BID) | ORAL | Status: DC
  Administered 2018-10-11 – 2018-10-14 (×7): 1250 mg via ORAL
  Filled 2018-10-10 (×7): qty 1

## 2018-10-10 NOTE — ED Provider Notes (Signed)
[facility] EMERGENCY DEPARTMENT Provider Note   CSN: [id number] Arrival date & time: 10/10/18  1435     History   Chief Complaint Chief Complaint  Patient presents with  . Fall    HPI Jill Bryant is a 77 y.o. female.  Patient with multiple medical problems including heart failure, high blood pressure, TIA, COPD, neuropathy, arthritis, uses a walker at baseline presents after multiple falls this week and lightheaded episodes.  Patient states she recalls details however suddenly falls backwards and hit her upper back and head.  This is happened 3 times.  Patient has pain in the upper back with palpation and movement.  Patient has had balance issues and falls in the past however not this frequent.  Patient currently denies any neurologic symptoms.  Patient does have mild pain in the back of the head.  Patient denies blood thinners or new medications.     Past Medical History:  Diagnosis Date  . Anemia   . Anxiety   . Arthritis   . Asthma   . Back pain, chronic   . CHF (congestive heart failure) ([place])   . COPD (chronic obstructive pulmonary disease) ([place])   . Depression   . GERD (gastroesophageal reflux disease)   . Headache    migraines  . Hyperlipidemia   . Hypertension   . Hypothyroidism   . 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 Lumbar stenosis   . Pneumonia   . PONV (postoperative nausea and vomiting)   . Retinal tear    right eye  . Seizures ([place])    with morphine  . Shortness of breath dyspnea    with exertion  . Sleep apnea    wears CPAP  . TIA (transient ischemic attack)   . UTI (lower urinary tract infection)   . Wears glasses     Patient Active Problem List   Diagnosis Date Noted  . Hypokalemia 10/11/2018  . HTN (hypertension) 10/11/2018  . NSTEMI (non-ST elevated myocardial infarction) ([place]) 10/11/2018  . Syncope 10/10/2018  . Elevated troponin 10/10/2018  . Acute head injury 10/10/2018  . Near syncope 10/10/2018  .  Thoracic aortic aneurysm without rupture ([place]) 02/03/2018  . Orthostatic hypotension 02/03/2018  . HNP (herniated nucleus pulposus), lumbar 02/27/2016  . Preoperative cardiovascular examination 01/23/2016  . GERD (gastroesophageal reflux disease) 04/27/2015  . S/P aorto-bifemoral bypass surgery 02/23/2015  . S/P AVR (aortic valve replacement) 02/23/2015  . CAD (coronary artery disease) 02/23/2015  . PVC's (premature ventricular contractions) 02/23/2015  . Carotid artery disease ([place]) 02/23/2015  . COPD, severity to be determined ([place]) 02/23/2015  . OSA on CPAP 02/23/2015  . Injury of third finger, right 03/29/2014    Past Surgical History:  Procedure Laterality Date  . aortic endarterectomy    . aortobifemoral bypass    . BREAST SURGERY     cyst aspiration right breast  . CARDIAC SURGERY     valve replacement 2013; aorta replacement 2002  . CATARACT EXTRACTION W/ INTRAOCULAR LENS  IMPLANT, BILATERAL    . CHOLECYSTECTOMY    . COLONOSCOPY    . HERNIA REPAIR    . LUMBAR EPIDURAL INJECTION    . LUMBAR LAMINECTOMY/DECOMPRESSION MICRODISCECTOMY Left 02/27/2016   Procedure: Laminectomy and Foraminotomy - Lumbar four-five -Lumbar three-four - left diskectomy Lumbar three-four;  Surgeon: Kary Kos, MD;  Location: [facility];  Service: Neurosurgery;  Laterality: Left;  Marland Kitchen MULTIPLE TOOTH EXTRACTIONS    . TUBAL LIGATION  OB History   No obstetric history on file.      Home Medications    Prior to Admission medications   Medication Sig Start Date End Date Taking? Authorizing Provider  acetaminophen (TYLENOL) 500 MG tablet Take 1,000 mg by mouth every 6 (six) hours as needed (for pain or headaches).    Yes [provider]  albuterol (ACCUNEB) 0.63 MG/3ML nebulizer solution Take 1 ampule by nebulization 2 (two) times daily as needed for wheezing or shortness of breath (or congestion).    Yes [provider]  albuterol (PROVENTIL HFA;VENTOLIN HFA) 108 (90 BASE)  MCG/ACT inhaler Inhale 2 puffs into the lungs every 6 (six) hours as needed for wheezing or shortness of breath.    Yes [provider]  aspirin EC 81 MG tablet Take 81 mg by mouth daily.   Yes [provider]  calcium carbonate (CALCIUM 600) 600 MG TABS tablet Take 600 mg by mouth 2 (two) times daily with a meal.   Yes [provider]  cetirizine (ZYRTEC) 10 MG tablet Take 10 mg by mouth daily.    Yes [provider]  Cholecalciferol (VITAMIN D-3) 25 MCG (1000 UT) CAPS Take 2,000 Units by mouth daily.   Yes [provider]  conjugated estrogens (PREMARIN) vaginal cream Place 1 Applicatorful vaginally See admin instructions. Insert a pea-sized amount of cream into the vagina 2-3 times a week   Yes [provider]  diazepam (VALIUM) 5 MG tablet Take 5 mg by mouth 2 (two) times daily as needed for anxiety.    Yes [provider]  ferrous sulfate 325 (65 FE) MG EC tablet Take 325 mg by mouth daily with breakfast.  04/21/18  Yes [provider]  FLUoxetine (PROZAC) 20 MG capsule Take 20 mg by mouth daily.  04/08/14  Yes [provider]  gabapentin (NEURONTIN) 300 MG capsule Take 600 mg by mouth at bedtime.  03/18/14  Yes [provider]  levothyroxine (SYNTHROID, LEVOTHROID) 100 MCG tablet Take 100 mcg by mouth daily before breakfast.  12/27/17  Yes [provider]  nitroGLYCERIN (NITROSTAT) 0.4 MG SL tablet Place 1 tablet (0.4 mg total) under the tongue every 5 (five) minutes. Patient taking differently: Place 0.4 mg under the tongue every 5 (five) minutes as needed for chest pain.  04/27/15  Yes Hilty, Nadean Corwin, MD  Omega-3 1000 MG CAPS Take 1,000 mg by mouth daily.    Yes [provider]  omeprazole (PRILOSEC) 20 MG capsule Take 20 mg by mouth daily before breakfast.  03/18/14  Yes [provider]  OXYGEN Inhale 2-4 L into the lungs at bedtime.   Yes [provider]  pravastatin  (PRAVACHOL) 20 MG tablet Take 20 mg by mouth at bedtime.   Yes [provider]  telmisartan (MICARDIS) 20 MG tablet Take 20 mg by mouth daily.   Yes [provider]  umeclidinium bromide (INCRUSE ELLIPTA) 62.5 MCG/INH AEPB Inhale 1 puff into the lungs daily.   Yes [provider]  vitamin B-12 (CYANOCOBALAMIN) 1000 MCG tablet Take 1,000 mcg by mouth daily.   Yes [provider]  levofloxacin (LEVAQUIN) 500 MG tablet Take 1 tablet (500 mg total) by mouth daily. Patient not taking: Reported on 10/10/2018 05/04/18   Carmin Muskrat, MD    Family History Family History  Problem Relation Age of Onset  . Other Mother   . Alzheimer's disease Father     Social History Social History   Tobacco Use  .  Smoking status: Former Smoker    Last attempt to quit: 10/30/2003    Years since quitting: 14.9  . Smokeless tobacco: Never Used  Substance Use Topics  . Alcohol use: No  . Drug use: No     Allergies   Tape; Amlodipine; Metoprolol; and Morphine and related   Review of Systems Review of Systems  Constitutional: Negative for chills and fever.  HENT: Negative for congestion.   Eyes: Negative for visual disturbance.  Respiratory: Negative for shortness of breath.   Cardiovascular: Negative for chest pain.  Gastrointestinal: Negative for abdominal pain and vomiting.  Genitourinary: Negative for dysuria and flank pain.  Musculoskeletal: Positive for back pain. Negative for neck pain and neck stiffness.  Skin: Negative for rash.  Neurological: Positive for light-headedness and headaches. Negative for syncope, weakness and numbness.     Physical Exam Updated Vital Signs BP (!) 177/63 (BP Location: Right Arm) Comment: map 93  Pulse 65   Temp 97.8 F (36.6 C) (Oral)   Resp 16   Ht 5\' 7"  (1.702 m)   Wt 88.4 kg   SpO2 100%   BMI 30.52 kg/m   Physical Exam Vitals signs and nursing note reviewed.  Constitutional:      Appearance: She is  well-developed.  HENT:     Head: Normocephalic and atraumatic.  Eyes:     General:        Right eye: No discharge.        Left eye: No discharge.     Conjunctiva/sclera: Conjunctivae normal.  Neck:     Musculoskeletal: Normal range of motion and neck supple.     Trachea: No tracheal deviation.  Cardiovascular:     Rate and Rhythm: Normal rate and regular rhythm.  Pulmonary:     Effort: Pulmonary effort is normal.     Breath sounds: Normal breath sounds.  Abdominal:     General: There is no distension.     Palpations: Abdomen is soft.     Tenderness: There is no abdominal tenderness. There is no guarding.  Musculoskeletal:     Comments: Patient has tenderness midline and paraspinal mid thoracic region and posterior scalp.  No midline cervical or lumbar tenderness.  Skin:    General: Skin is warm.     Findings: No rash.  Neurological:     Mental Status: She is alert and oriented to person, place, and time.     GCS: GCS eye subscore is 4. GCS verbal subscore is 5. GCS motor subscore is 6.     Comments: 5+ strength in UE and LE with f/e at major joints. Sensation to palpation intact in UE and LE. CNs 2-12 grossly intact.  EOMFI.  PERRL.   Finger nose and coordination intact bilateral.   No nystagmus       ED Treatments / Results  Labs (all labs ordered are listed, but only abnormal results are displayed) Labs Reviewed  COMPREHENSIVE METABOLIC PANEL - Abnormal; Notable for the following components:      Result Value   Creatinine, Ser 1.44 (*)    Albumin 3.2 (*)    Alkaline Phosphatase 36 (*)    GFR calc non Af Amer 34 (*)    GFR calc Af Amer 40 (*)    All other components within normal limits  TROPONIN I - Abnormal; Notable for the following components:   Troponin I 0.42 (*)    All other components within normal limits  URINALYSIS, ROUTINE W REFLEX MICROSCOPIC - Abnormal;  Notable for the following components:   Leukocytes, UA TRACE (*)    Bacteria, UA RARE (*)     All other components within normal limits  BASIC METABOLIC PANEL - Abnormal; Notable for the following components:   Potassium 3.4 (*)    Creatinine, Ser 1.34 (*)    Calcium 8.6 (*)    GFR calc non Af Amer 38 (*)    GFR calc Af Amer 44 (*)    All other components within normal limits  CBC - Abnormal; Notable for the following components:   Hemoglobin 11.0 (*)    All other components within normal limits  CBC - Abnormal; Notable for the following components:   Hemoglobin 11.3 (*)    All other components within normal limits  CREATININE, SERUM - Abnormal; Notable for the following components:   Creatinine, Ser 1.49 (*)    GFR calc non Af Amer 33 (*)    GFR calc Af Amer 38 (*)    All other components within normal limits  TROPONIN I - Abnormal; Notable for the following components:   Troponin I 0.34 (*)    All other components within normal limits  TROPONIN I - Abnormal; Notable for the following components:   Troponin I 0.27 (*)    All other components within normal limits  TROPONIN I - Abnormal; Notable for the following components:   Troponin I 0.24 (*)    All other components within normal limits  TROPONIN I - Abnormal; Notable for the following components:   Troponin I 0.25 (*)    All other components within normal limits  CBC  TSH  TROPONIN I  CBG MONITORING, ED    EKG EKG Interpretation  Date/Time:  Friday October 10 2018 17:28:52 EST Ventricular Rate:  69 PR Interval:  216 QRS Duration: 140 QT Interval:  470 QTC Calculation: 504 R Axis:   23 Text Interpretation:  Sinus rhythm Right bundle branch block Confirmed by Elnora Morrison [phone]) on 10/10/2018 6:31:56 PM   Radiology Ct Head Wo Contrast  Result Date: 10/10/2018 CLINICAL DATA:  Multiple recent falls EXAM: CT HEAD WITHOUT CONTRAST TECHNIQUE: Contiguous axial images were obtained from the base of the skull through the vertex without intravenous contrast. COMPARISON:  Head CT 05/12/2013 FINDINGS: Brain:  There is no mass, hemorrhage or extra-axial collection. There is generalized atrophy without lobar predilection. There is hypoattenuation of the periventricular white matter, most commonly indicating chronic ischemic microangiopathy. Vascular: No abnormal hyperdensity of the major intracranial arteries or dural venous sinuses. No intracranial atherosclerosis. Skull: The visualized skull base, calvarium and extracranial soft tissues are normal. Sinuses/Orbits: No fluid levels or advanced mucosal thickening of the visualized paranasal sinuses. No mastoid or middle ear effusion. The orbits are normal. IMPRESSION: 1. No acute intracranial abnormality. 2. Atrophy and chronic ischemic microangiopathy. Electronically Signed   By: Ulyses Jarred M.D.   On: 10/10/2018 19:08   Ct Thoracic Spine Wo Contrast  Result Date: 10/10/2018 CLINICAL DATA:  Multiple recent falls. Upper back pain. EXAM: CT THORACIC SPINE WITHOUT CONTRAST TECHNIQUE: Multidetector CT images of the thoracic were obtained using the standard protocol without intravenous contrast. COMPARISON:  None. FINDINGS: Alignment: Normal. Vertebrae: There is no acute fracture. Mild multilevel central vertebral body height loss, degenerative. No aggressive marrow lesion. Paraspinal and other soft tissues: Focal area of consolidation in the right lower lobe, unchanged compared to 09/08/2018. there is calcific aortic atherosclerosis. Disc levels: No large disc herniation. No spinal canal stenosis. No thoracic neural foraminal stenosis.  IMPRESSION: 1. No acute fracture or static subluxation of the thoracic spine. 2. Unchanged consolidation within the right lower lobe, incompletely visualized. 3.  Aortic atherosclerosis (ICD10-I70.0). Electronically Signed   By: Ulyses Jarred M.D.   On: 10/10/2018 19:04   Dg Chest Port 1 View  Result Date: 10/11/2018 CLINICAL DATA:  Shortness of breath. EXAM: PORTABLE CHEST 1 VIEW COMPARISON:  Radiograph of September 08, 2018.  FINDINGS: Stable cardiomegaly. Status post aortic valve replacement. Atherosclerosis of thoracic aorta is noted. No pneumothorax is noted. Left lung is clear. No significant pleural effusion is noted. Stable right basilar atelectasis, pneumonia or scarring is noted. Bony thorax is unremarkable. IMPRESSION: Stable right basilar opacity as described above. No new abnormality is noted. Aortic Atherosclerosis (ICD10-I70.0). Electronically Signed   By: Marijo Conception, M.D.   On: 10/11/2018 09:28    Procedures Procedures (including critical care time)  Medications Ordered in ED Medications  aspirin EC tablet 81 mg (81 mg Oral Given 10/11/18 1022)  nitroGLYCERIN (NITROSTAT) SL tablet 0.4 mg (has no administration in time range)  pravastatin (PRAVACHOL) tablet 20 mg (20 mg Oral Given 10/11/18 2131)  irbesartan (AVAPRO) tablet 75 mg (75 mg Oral Given 10/11/18 1020)  diazepam (VALIUM) tablet 5 mg (has no administration in time range)  FLUoxetine (PROZAC) capsule 20 mg (20 mg Oral Given 10/11/18 1021)  levothyroxine (SYNTHROID, LEVOTHROID) tablet 100 mcg (100 mcg Oral Given 10/11/18 0602)  pantoprazole (PROTONIX) EC tablet 40 mg (40 mg Oral Given 10/11/18 1022)  ferrous sulfate tablet 325 mg (325 mg Oral Given 10/11/18 0829)  vitamin B-12 (CYANOCOBALAMIN) tablet 1,000 mcg (1,000 mcg Oral Given 10/11/18 1022)  gabapentin (NEURONTIN) capsule 600 mg (600 mg Oral Given 10/11/18 2131)  calcium carbonate (OS-CAL - dosed in mg of elemental calcium) tablet 1,250 mg (1,250 mg Oral Given 10/11/18 1748)  cholecalciferol (VITAMIN D3) tablet 2,000 Units (2,000 Units Oral Given 10/11/18 1021)  omega-3 acid ethyl esters (LOVAZA) capsule 1,000 mg (1,000 mg Oral Given 10/11/18 1021)  albuterol (PROVENTIL) (2.5 MG/3ML) 0.083% nebulizer solution 3 mL (has no administration in time range)  loratadine (CLARITIN) tablet 10 mg (10 mg Oral Given 10/11/18 1022)  umeclidinium bromide (INCRUSE ELLIPTA) 62.5 MCG/INH 1 puff (1  puff Inhalation Given 10/11/18 0839)  acetaminophen (TYLENOL) tablet 650 mg (has no administration in time range)    Or  acetaminophen (TYLENOL) suppository 650 mg (has no administration in time range)  ondansetron (ZOFRAN) tablet 4 mg (has no administration in time range)    Or  ondansetron (ZOFRAN) injection 4 mg (has no administration in time range)  enoxaparin (LOVENOX) injection 40 mg (40 mg Subcutaneous Given 10/11/18 2133)  0.9 %  sodium chloride infusion ( Intravenous New Bag/Given 10/11/18 0653)  hydrALAZINE (APRESOLINE) injection 10 mg (has no administration in time range)  aspirin chewable tablet 324 mg (324 mg Oral Given 10/10/18 2016)     Initial Impression / Assessment and Plan / ED Course  I have reviewed the triage vital signs and the nursing notes.  Pertinent labs & imaging results that were available during my care of the patient were reviewed by me and considered in my medical decision making (see chart for details).    Patient with multiple medical conditions presents after 3 falls and lightheaded episodes this week.  Patient has had this in the past however this was more frequent.  Plan for CT scan to look for details of fall/musculoskeletal pain.  Blood work to check for electrolytes, hemoglobin, troponin.  Blood work reviewed troponin mild elevated.  CT scan no acute findings.  Patient admitted for further work-up.  Final Clinical Impressions(s) / ED Diagnoses   Final diagnoses:  Near syncope  Acute head injury, initial encounter  Falls frequently    ED Discharge Orders    None       Elnora Morrison, MD 10/12/18 [phone]

## 2018-10-10 NOTE — ED Triage Notes (Signed)
Pt to ER by [facility] - reports multiple falls over the last week, states "wakes up on the floor." Went to PCP yesterday, happened today, states has been in the floor for 4 hours. A/o x4 on arrival.

## 2018-10-10 NOTE — ED Triage Notes (Signed)
Per pt, has had multiple falls in last 2 weeks. C/O back pain from falls. Denies hitting head.

## 2018-10-10 NOTE — H&P (Signed)
History and Physical    Jill Bryant CLE:[id number] DOB: May 07, 1941 DOA: 10/10/2018  PCP: Curt Jews, PA-C  Patient coming from: Home.  Chief Complaint: Fall.  HPI: Jill Bryant is a 77 y.o. female with history of bioprosthetic aortic valve replacement, aortofemoral bypass, COPD, sleep apnea, chronic kidney disease hypertension presents to the ER after patient has been having repeated falls over the last 3 days.  Patient states that each day patient had at least one fall.  Patient uses a walker to walk and fell backwards.  Denies losing consciousness or any headache.  Denies any chest pain shortness of breath.  Has been having frequent dizziness.  Patient stated she felt dizzy before the fall.  ED Course: In the ER EKG shows normal sinus rhythm troponin mildly elevated CT head and CT T-spine did not show anything acute except for some consolidation.  Patient admitted for further observation given the elevated troponin and frequent near syncopal episodes.  Review of Systems: As per HPI, rest all negative.   Past Medical History:  Diagnosis Date  . Anemia   . Anxiety   . Arthritis   . Asthma   . Back pain, chronic   . CHF (congestive heart failure) ([place])   . COPD (chronic obstructive pulmonary disease) ([place])   . Depression   . GERD (gastroesophageal reflux disease)   . Headache    migraines  . Hyperlipidemia   . Hypertension   . Hypothyroidism   . 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 Lumbar stenosis   . Pneumonia   . PONV (postoperative nausea and vomiting)   . Retinal tear    right eye  . Seizures ([place])    with morphine  . Shortness of breath dyspnea    with exertion  . Sleep apnea    wears CPAP  . TIA (transient ischemic attack)   . UTI (lower urinary tract infection)   . Wears glasses     Past Surgical History:  Procedure Laterality Date  . aortic endarterectomy    . aortobifemoral bypass    . BREAST SURGERY     cyst aspiration right breast  .  CARDIAC SURGERY     valve replacement 2013; aorta replacement 2002  . CATARACT EXTRACTION W/ INTRAOCULAR LENS  IMPLANT, BILATERAL    . CHOLECYSTECTOMY    . COLONOSCOPY    . HERNIA REPAIR    . LUMBAR EPIDURAL INJECTION    . LUMBAR LAMINECTOMY/DECOMPRESSION MICRODISCECTOMY Left 02/27/2016   Procedure: Laminectomy and Foraminotomy - Lumbar four-five -Lumbar three-four - left diskectomy Lumbar three-four;  Surgeon: Kary Kos, MD;  Location: [facility];  Service: Neurosurgery;  Laterality: Left;  Marland Kitchen MULTIPLE TOOTH EXTRACTIONS    . TUBAL LIGATION       reports that she quit smoking about 14 years ago. She has never used smokeless tobacco. She reports that she does not drink alcohol or use drugs.  Allergies  Allergen Reactions  . Tape Rash and Other (See Comments)    SKIN IS VERY THIN; TAPE TEARS THE SKIN!!  . Amlodipine Swelling and Other (See Comments)    Feet/toes swelling  . Metoprolol Other (See Comments)    Bradycardia and fatigue  . Morphine And Related Other (See Comments)    Hallucinations/seizures    Family History  Problem Relation Age of Onset  . Other Mother   . Alzheimer's disease Father     Prior to Admission medications   Medication Sig Start Date  End Date Taking? Authorizing Provider  acetaminophen (TYLENOL) 500 MG tablet Take 1,000 mg by mouth every 6 (six) hours as needed (for pain or headaches).    Yes [provider]  albuterol (ACCUNEB) 0.63 MG/3ML nebulizer solution Take 1 ampule by nebulization 2 (two) times daily as needed for wheezing or shortness of breath (or congestion).    Yes [provider]  albuterol (PROVENTIL HFA;VENTOLIN HFA) 108 (90 BASE) MCG/ACT inhaler Inhale 2 puffs into the lungs every 6 (six) hours as needed for wheezing or shortness of breath.    Yes [provider]  aspirin EC 81 MG tablet Take 81 mg by mouth daily.   Yes [provider]  calcium carbonate (CALCIUM 600) 600 MG TABS tablet Take 600 mg by  mouth 2 (two) times daily with a meal.   Yes [provider]  cetirizine (ZYRTEC) 10 MG tablet Take 10 mg by mouth daily.    Yes [provider]  Cholecalciferol (VITAMIN D-3) 25 MCG (1000 UT) CAPS Take 2,000 Units by mouth daily.   Yes [provider]  conjugated estrogens (PREMARIN) vaginal cream Place 1 Applicatorful vaginally See admin instructions. Insert a pea-sized amount of cream into the vagina 2-3 times a week   Yes [provider]  diazepam (VALIUM) 5 MG tablet Take 5 mg by mouth 2 (two) times daily as needed for anxiety.    Yes [provider]  ferrous sulfate 325 (65 FE) MG EC tablet Take 325 mg by mouth daily with breakfast.  04/21/18  Yes [provider]  FLUoxetine (PROZAC) 20 MG capsule Take 20 mg by mouth daily.  04/08/14  Yes [provider]  gabapentin (NEURONTIN) 300 MG capsule Take 600 mg by mouth at bedtime.  03/18/14  Yes [provider]  levothyroxine (SYNTHROID, LEVOTHROID) 100 MCG tablet Take 100 mcg by mouth daily before breakfast.  12/27/17  Yes [provider]  nitroGLYCERIN (NITROSTAT) 0.4 MG SL tablet Place 1 tablet (0.4 mg total) under the tongue every 5 (five) minutes. Patient taking differently: Place 0.4 mg under the tongue every 5 (five) minutes as needed for chest pain.  04/27/15  Yes Hilty, Nadean Corwin, MD  Omega-3 1000 MG CAPS Take 1,000 mg by mouth daily.    Yes [provider]  omeprazole (PRILOSEC) 20 MG capsule Take 20 mg by mouth daily before breakfast.  03/18/14  Yes [provider]  OXYGEN Inhale 2-4 L into the lungs at bedtime.   Yes [provider]  pravastatin (PRAVACHOL) 20 MG tablet Take 20 mg by mouth at bedtime.   Yes [provider]  telmisartan (MICARDIS) 20 MG tablet Take 20 mg by mouth daily.   Yes [provider]  umeclidinium bromide (INCRUSE ELLIPTA) 62.5 MCG/INH AEPB Inhale 1 puff into the lungs daily.   Yes [provider]  vitamin B-12 (CYANOCOBALAMIN) 1000 MCG tablet Take 1,000 mcg by mouth daily.   Yes [provider]  levofloxacin (LEVAQUIN) 500 MG tablet Take 1 tablet (500 mg total) by mouth daily. Patient not taking: Reported on 10/10/2018 05/04/18   Carmin Muskrat, MD    Physical Exam: Vitals:   10/10/18 2000 10/10/18 2015 10/10/18 2035 10/10/18 2110  BP: (!) 189/84 136/81  (!) 172/87  Pulse: 60 63  73  Resp: 13 14  19   Temp:   (!) 97.5 F (36.4 C) (!) 97.5 F (36.4 C)  TempSrc:   Oral Oral  SpO2: 92% 96%  (!) 88%  Weight:    88.4 kg  Height:    5\' 7"  (1.702 m)      Constitutional: Moderately built and nourished. Vitals:   10/10/18 2000 10/10/18 2015 10/10/18 2035 10/10/18 2110  BP: (!) 189/84 136/81  (!) 172/87  Pulse: 60 63  73  Resp: 13 14  19   Temp:   (!) 97.5 F (36.4 C) (!) 97.5 F (36.4 C)  TempSrc:   Oral Oral  SpO2: 92% 96%  (!) 88%  Weight:    88.4 kg  Height:    5\' 7"  (1.702 m)   Eyes: Anicteric no pallor. ENMT: No discharge from the ears eyes nose or mouth. Neck: No mass felt.  No JVD appreciated. Respiratory: No rhonchi or crepitations. Cardiovascular: S1-S2 heard. Abdomen: Soft nontender bowel sounds present. Musculoskeletal: No edema. Skin: No rash. Neurologic: Alert awake oriented to time place and person.  Moves all extremities. Psychiatric: He is normal.   Labs on Admission: I have personally reviewed following labs and imaging studies  CBC: Recent Labs  Lab 10/10/18 1823  WBC 7.7  HGB 12.6  HCT 41.2  MCV 94.3  PLT 924   Basic Metabolic Panel: Recent Labs  Lab 10/10/18 1823  NA 140  K 3.6  CL 102  CO2 25  GLUCOSE 84  BUN 13  CREATININE 1.44*  CALCIUM 9.2   GFR: Estimated Creatinine Clearance: 36.2 mL/min (A) (by C-G formula based on SCr of 1.44 mg/dL (H)). Liver Function Tests: Recent Labs  Lab 10/10/18 1823  AST 24  ALT 12  ALKPHOS 36*  BILITOT 1.0  PROT 6.5  ALBUMIN 3.2*   No results for input(s):  LIPASE, AMYLASE in the last 168 hours. No results for input(s): AMMONIA in the last 168 hours. Coagulation Profile: No results for input(s): INR, PROTIME in the last 168 hours. Cardiac Enzymes: Recent Labs  Lab 10/10/18 1823  TROPONINI 0.42*   BNP (last 3 results) No results for input(s): PROBNP in the last 8760 hours. HbA1C: No results for input(s): HGBA1C in the last 72 hours. CBG: Recent Labs  Lab 10/10/18 1920  GLUCAP 74   Lipid Profile: No results for input(s): CHOL, HDL, LDLCALC, TRIG, CHOLHDL, LDLDIRECT in the last 72 hours. Thyroid Function Tests: No results for input(s): TSH, T4TOTAL, FREET4, T3FREE, THYROIDAB in the last 72 hours. Anemia Panel: No results for input(s): VITAMINB12, FOLATE, FERRITIN, TIBC, IRON, RETICCTPCT in the last 72 hours. Urine analysis:    Component Value Date/Time   COLORURINE YELLOW 10/10/2018 1924   APPEARANCEUR CLEAR 10/10/2018 1924   LABSPEC 1.006 10/10/2018 1924   PHURINE 6.0 10/10/2018 1924   GLUCOSEU NEGATIVE 10/10/2018 1924   HGBUR NEGATIVE 10/10/2018 [place] NEGATIVE 10/10/2018 [place] NEGATIVE 10/10/2018 1924   PROTEINUR NEGATIVE 10/10/2018 1924   UROBILINOGEN 1.0 08/17/2015 1206   NITRITE NEGATIVE 10/10/2018 1924   LEUKOCYTESUR TRACE (A) 10/10/2018 1924   Sepsis Labs: @LABRCNTIP (procalcitonin:4,lacticidven:4) )No results found for this or any previous visit (from the past 240 hour(s)).   Radiological Exams on Admission: Ct Head Wo Contrast  Result Date: 10/10/2018 CLINICAL DATA:  Multiple recent falls EXAM: CT HEAD WITHOUT CONTRAST TECHNIQUE: Contiguous axial images were obtained from the base of the skull through the vertex without intravenous contrast. COMPARISON:  Head CT 05/12/2013 FINDINGS: Brain: There is no mass, hemorrhage or extra-axial collection. There is generalized atrophy without lobar predilection. There is hypoattenuation of the periventricular white matter, most commonly indicating  chronic ischemic microangiopathy. Vascular: No abnormal hyperdensity  of the major intracranial arteries or dural venous sinuses. No intracranial atherosclerosis. Skull: The visualized skull base, calvarium and extracranial soft tissues are normal. Sinuses/Orbits: No fluid levels or advanced mucosal thickening of the visualized paranasal sinuses. No mastoid or middle ear effusion. The orbits are normal. IMPRESSION: 1. No acute intracranial abnormality. 2. Atrophy and chronic ischemic microangiopathy. Electronically Signed   By: Ulyses Jarred M.D.   On: 10/10/2018 19:08   Ct Thoracic Spine Wo Contrast  Result Date: 10/10/2018 CLINICAL DATA:  Multiple recent falls. Upper back pain. EXAM: CT THORACIC SPINE WITHOUT CONTRAST TECHNIQUE: Multidetector CT images of the thoracic were obtained using the standard protocol without intravenous contrast. COMPARISON:  None. FINDINGS: Alignment: Normal. Vertebrae: There is no acute fracture. Mild multilevel central vertebral body height loss, degenerative. No aggressive marrow lesion. Paraspinal and other soft tissues: Focal area of consolidation in the right lower lobe, unchanged compared to 09/08/2018. there is calcific aortic atherosclerosis. Disc levels: No large disc herniation. No spinal canal stenosis. No thoracic neural foraminal stenosis. IMPRESSION: 1. No acute fracture or static subluxation of the thoracic spine. 2. Unchanged consolidation within the right lower lobe, incompletely visualized. 3.  Aortic atherosclerosis (ICD10-I70.0). Electronically Signed   By: Ulyses Jarred M.D.   On: 10/10/2018 19:04    EKG: Independently reviewed.  Normal sinus rhythm RBBB QTC was 504 ms.  Assessment/Plan Principal Problem:   Syncope Active Problems:   S/P aorto-bifemoral bypass surgery   S/P AVR (aortic valve replacement)   CAD (coronary artery disease)   PVC's (premature ventricular contractions)   COPD, severity to be determined (HCC)   OSA on CPAP   Thoracic  aortic aneurysm without rupture (HCC)   Elevated troponin   Acute head injury    1. Near syncopal episodes falls with dizziness -mild elevated troponin was secondary denies any chest pain moderate telemetry get physical therapy consult.  Check orthostatics. 2. Elevated troponin denies any chest pain cycle cardiac markers. 3. COPD not wheezing continue home inhalers. 4. Chronic kidney disease stage III creatinine appears to be at baseline.  Note that patient is on ARB. 5. Hypertension uncontrolled on ARB.  Will add PRN IV hydralazine. 6. Status post aortic valve replacement bioprosthetic with history of aortofemoral bypass. 7. Hyperlipidemia on statins. 8. Sleep apnea on CPAP.  Patient's CAT scan of the thoracic spine was showing consolidation for which we will get a chest x-ray.  Patient denies any cough or productive sputum.  Looks like patient was recently on antibiotics.   DVT prophylaxis: Lovenox. Code Status: Full code. Family Communication: Discussed with patient. Disposition Plan: Home. Consults called: None. Admission status: Observation.   Rise Patience MD [facility] Pager [phone].  If 7PM-7AM, please contact night-coverage [email or web address] Password [facility]  10/10/2018, 10:25 PM

## 2018-10-11 ENCOUNTER — Observation Stay (HOSPITAL_COMMUNITY): Payer: Medicare HMO

## 2018-10-11 ENCOUNTER — Observation Stay (HOSPITAL_BASED_OUTPATIENT_CLINIC_OR_DEPARTMENT_OTHER): Payer: Medicare HMO

## 2018-10-11 DIAGNOSIS — I214 Non-ST elevation (NSTEMI) myocardial infarction: Secondary | ICD-10-CM | POA: Diagnosis present

## 2018-10-11 DIAGNOSIS — R55 Syncope and collapse: Secondary | ICD-10-CM

## 2018-10-11 DIAGNOSIS — I10 Essential (primary) hypertension: Secondary | ICD-10-CM | POA: Diagnosis present

## 2018-10-11 DIAGNOSIS — E876 Hypokalemia: Secondary | ICD-10-CM | POA: Diagnosis present

## 2018-10-11 LAB — BASIC METABOLIC PANEL
Anion gap: 9 (ref 5–15)
BUN: 12 mg/dL (ref 8–23)
CO2: 27 mmol/L (ref 22–32)
Calcium: 8.6 mg/dL — ABNORMAL LOW (ref 8.9–10.3)
Chloride: 106 mmol/L (ref 98–111)
Creatinine, Ser: 1.34 mg/dL — ABNORMAL HIGH (ref 0.44–1.00)
GFR calc non Af Amer: 38 mL/min — ABNORMAL LOW (ref >60)
GFR, EST AFRICAN AMERICAN: 44 mL/min — AB (ref >60)
Glucose, Bld: 98 mg/dL (ref 70–99)
Potassium: 3.4 mmol/L — ABNORMAL LOW (ref 3.5–5.1)
Sodium: 142 mmol/L (ref 135–145)

## 2018-10-11 LAB — CBC
HCT: 36.7 % (ref 36.0–46.0)
Hemoglobin: 11 g/dL — ABNORMAL LOW (ref 12.0–15.0)
MCH: 28.4 pg (ref 26.0–34.0)
MCHC: 30 g/dL (ref 30.0–36.0)
MCV: 94.8 fL (ref 80.0–100.0)
NRBC: 0 % (ref 0.0–0.2)
Platelets: 196 10*3/uL (ref 150–400)
RBC: 3.87 MIL/uL (ref 3.87–5.11)
RDW: 14 % (ref 11.5–15.5)
WBC: 6.1 10*3/uL (ref 4.0–10.5)

## 2018-10-11 LAB — TROPONIN I
Troponin I: 0.27 ng/mL (ref <0.03)
Troponin I: 0.24 ng/mL (ref <0.03)
Troponin I: 0.25 ng/mL (ref <0.03)

## 2018-10-11 LAB — ECHOCARDIOGRAM COMPLETE
Height: 67 in
WEIGHTICAEL: 3118.19 [oz_av]

## 2018-10-11 NOTE — Plan of Care (Signed)
Acute rehab goals established. 

## 2018-10-11 NOTE — Progress Notes (Signed)
Patient ID: Jill Bryant, female   DOB: 1941/03/18, 77 y.o.   MRN: [id number]  PROGRESS NOTE    Jill Bryant  WUJ:[id number] DOB: Oct 20, 1941 DOA: 10/10/2018 PCP: Curt Jews, PA-C    Brief Narrative:  Jill Bryant is a 77 y.o. female with history of bioprosthetic aortic valve replacement, aortofemoral bypass, COPD, sleep apnea, chronic kidney disease hypertension presents to the ER after patient has been having repeated falls over the last 3 days.  Patient states that each day patient had at least one fall.  Patient uses a walker to walk and fell backwards.  Denies losing consciousness or any headache.  Denies any chest pain shortness of breath.  Has been having frequent dizziness.  Patient stated she felt dizzy before the fall.  Patient was therefore admitted for evaluation of presyncope with possible non-ST elevation MI due to elevation of troponin.    Assessment & Plan:   Principal Problem:   Near syncope Active Problems:   S/P aorto-bifemoral bypass surgery   S/P AVR (aortic valve replacement)   CAD (coronary artery disease)   PVC's (premature ventricular contractions)   COPD, severity to be determined (HCC)   OSA on CPAP   Thoracic aortic aneurysm without rupture (HCC)   Elevated troponin   Acute head injury   Hypokalemia   HTN (hypertension)   NSTEMI (non-ST elevated myocardial infarction) ([place])   #1 near syncope: Patient has had recurrent dizziness.  She appears to have some type of arrhythmias.  Her troponin is elevated to 0.22 which may also indicate intracardiac cause.  She had no fever or chills.  At this point she is not orthostatic but extensive cardiac history.  Follow-up on echocardiogram.  May consider electrophysiology consultation.  The symptoms are recurrent and patient lives alone.  She will need PT and OT evaluation for safety reasons.  #2 elevated troponin: Non-ST elevation MI suspected.  If enzymes continue to rise we may treat as such.  No chest pain  however.  #3 coronary artery disease: Stable as per #2.  #4 essential hypertension: Continue home regiment for blood pressure control.  #5 hypokalemia: Replete potassium.  #6 COPD: Continue treatment with empiric nebulizer  #7 obstructive sleep apnea: Patient currently on CPAP at night.  Continue.  #8 morbid obesity: Dietary counseling.   DVT prophylaxis: Lovenox Code Status: Full code Family Communication: Son at bedside Disposition Plan: To be determined   Consultants:   None  Procedures:   Echocardiogram  Antimicrobials:  None   Subjective: 77 year old female admitted with recurrent presyncopal episodes.  Patient has had another episode of presyncope.  She is stable today but is known to have carotid occlusion.  She feels much better at the moment but still dizzy when she gets up.  Objective: Vitals:   10/11/18 0330 10/11/18 0801 10/11/18 0840 10/11/18 1241  BP: (!) 173/62 112/63  (!) 157/79  Pulse: 61 60  67  Resp: 16 16  16   Temp: 97.8 F (36.6 C) 98 F (36.7 C)  97.8 F (36.6 C)  TempSrc: Oral Oral  Oral  SpO2: 98% 98% 98% 98%  Weight:      Height:        Intake/Output Summary (Last 24 hours) at 10/11/2018 1257 Last data filed at 10/11/2018 0600 Gross per 24 hour  Intake 1000 ml  Output -  Net 1000 ml   Filed Weights   10/10/18 1745 10/10/18 2110  Weight: 90.7 kg 88.4 kg    Examination:  General exam: Appears  calm and comfortable  Respiratory system: Clear to auscultation. Respiratory effort normal. Cardiovascular system: S1 & S2 heard, RRR. No JVD, murmurs, rubs, gallops or clicks. No pedal edema. Gastrointestinal system: Abdomen is nondistended, soft and nontender. No organomegaly or masses felt. Normal bowel sounds heard. Central nervous system: Alert and oriented. No focal neurological deficits. Extremities: Symmetric 5 x 5 power. Skin: No rashes, lesions or ulcers Psychiatry: Judgement and insight appear normal. Mood & affect  appropriate.     Data Reviewed: I have personally reviewed following labs and imaging studies  CBC: Recent Labs  Lab 10/10/18 1823 10/10/18 2241 10/11/18 0347  WBC 7.7 6.1 6.1  HGB 12.6 11.3* 11.0*  HCT 41.2 37.0 36.7  MCV 94.3 93.7 94.8  PLT 196 180 010   Basic Metabolic Panel: Recent Labs  Lab 10/10/18 1823 10/10/18 2241 10/11/18 0347  NA 140  --  142  K 3.6  --  3.4*  CL 102  --  106  CO2 25  --  27  GLUCOSE 84  --  98  BUN 13  --  12  CREATININE 1.44* 1.49* 1.34*  CALCIUM 9.2  --  8.6*   GFR: Estimated Creatinine Clearance: 38.9 mL/min (A) (by C-G formula based on SCr of 1.34 mg/dL (H)). Liver Function Tests: Recent Labs  Lab 10/10/18 1823  AST 24  ALT 12  ALKPHOS 36*  BILITOT 1.0  PROT 6.5  ALBUMIN 3.2*   No results for input(s): LIPASE, AMYLASE in the last 168 hours. No results for input(s): AMMONIA in the last 168 hours. Coagulation Profile: No results for input(s): INR, PROTIME in the last 168 hours. Cardiac Enzymes: Recent Labs  Lab 10/10/18 1823 10/10/18 2241 10/11/18 0347 10/11/18 1103  TROPONINI 0.42* 0.34* 0.27* 0.24*   BNP (last 3 results) No results for input(s): PROBNP in the last 8760 hours. HbA1C: No results for input(s): HGBA1C in the last 72 hours. CBG: Recent Labs  Lab 10/10/18 1920  GLUCAP 74   Lipid Profile: No results for input(s): CHOL, HDL, LDLCALC, TRIG, CHOLHDL, LDLDIRECT in the last 72 hours. Thyroid Function Tests: Recent Labs    10/10/18 2241  TSH 1.664   Anemia Panel: No results for input(s): VITAMINB12, FOLATE, FERRITIN, TIBC, IRON, RETICCTPCT in the last 72 hours. Sepsis Labs: No results for input(s): PROCALCITON, LATICACIDVEN in the last 168 hours.  No results found for this or any previous visit (from the past 240 hour(s)).       Radiology Studies: Ct Head Wo Contrast  Result Date: 10/10/2018 CLINICAL DATA:  Multiple recent falls EXAM: CT HEAD WITHOUT CONTRAST TECHNIQUE: Contiguous axial  images were obtained from the base of the skull through the vertex without intravenous contrast. COMPARISON:  Head CT 05/12/2013 FINDINGS: Brain: There is no mass, hemorrhage or extra-axial collection. There is generalized atrophy without lobar predilection. There is hypoattenuation of the periventricular white matter, most commonly indicating chronic ischemic microangiopathy. Vascular: No abnormal hyperdensity of the major intracranial arteries or dural venous sinuses. No intracranial atherosclerosis. Skull: The visualized skull base, calvarium and extracranial soft tissues are normal. Sinuses/Orbits: No fluid levels or advanced mucosal thickening of the visualized paranasal sinuses. No mastoid or middle ear effusion. The orbits are normal. IMPRESSION: 1. No acute intracranial abnormality. 2. Atrophy and chronic ischemic microangiopathy. Electronically Signed   By: Ulyses Jarred M.D.   On: 10/10/2018 19:08   Ct Thoracic Spine Wo Contrast  Result Date: 10/10/2018 CLINICAL DATA:  Multiple recent falls. Upper back pain. EXAM: CT THORACIC  SPINE WITHOUT CONTRAST TECHNIQUE: Multidetector CT images of the thoracic were obtained using the standard protocol without intravenous contrast. COMPARISON:  None. FINDINGS: Alignment: Normal. Vertebrae: There is no acute fracture. Mild multilevel central vertebral body height loss, degenerative. No aggressive marrow lesion. Paraspinal and other soft tissues: Focal area of consolidation in the right lower lobe, unchanged compared to 09/08/2018. there is calcific aortic atherosclerosis. Disc levels: No large disc herniation. No spinal canal stenosis. No thoracic neural foraminal stenosis. IMPRESSION: 1. No acute fracture or static subluxation of the thoracic spine. 2. Unchanged consolidation within the right lower lobe, incompletely visualized. 3.  Aortic atherosclerosis (ICD10-I70.0). Electronically Signed   By: Ulyses Jarred M.D.   On: 10/10/2018 19:04   Dg Chest Port 1  View  Result Date: 10/11/2018 CLINICAL DATA:  Shortness of breath. EXAM: PORTABLE CHEST 1 VIEW COMPARISON:  Radiograph of September 08, 2018. FINDINGS: Stable cardiomegaly. Status post aortic valve replacement. Atherosclerosis of thoracic aorta is noted. No pneumothorax is noted. Left lung is clear. No significant pleural effusion is noted. Stable right basilar atelectasis, pneumonia or scarring is noted. Bony thorax is unremarkable. IMPRESSION: Stable right basilar opacity as described above. No new abnormality is noted. Aortic Atherosclerosis (ICD10-I70.0). Electronically Signed   By: Marijo Conception, M.D.   On: 10/11/2018 09:28        Scheduled Meds: . aspirin EC  81 mg Oral Daily  . calcium carbonate  1,250 mg Oral BID WC  . cholecalciferol  2,000 Units Oral Daily  . enoxaparin (LOVENOX) injection  40 mg Subcutaneous Q24H  . ferrous sulfate  325 mg Oral Q breakfast  . FLUoxetine  20 mg Oral Daily  . gabapentin  600 mg Oral QHS  . irbesartan  75 mg Oral Daily  . levothyroxine  100 mcg Oral Q24H  . loratadine  10 mg Oral Daily  . omega-3 acid ethyl esters  1,000 mg Oral Daily  . pantoprazole  40 mg Oral Daily  . pravastatin  20 mg Oral QHS  . umeclidinium bromide  1 puff Inhalation Daily  . vitamin B-12  1,000 mcg Oral Daily   Continuous Infusions: . sodium chloride 100 mL/hr at 10/11/18 0653     LOS: 0 days    Time spent: 63 minutes    Terryl Niziolek,LAWAL, MD [facility] Pager [phone] If 7PM-7AM, please contact night-coverage [email or web address] Password [facility] 10/11/2018, 12:57 PM

## 2018-10-11 NOTE — Evaluation (Signed)
Physical Therapy Evaluation Patient Details Name: Jill Bryant MRN: [id number] DOB: [birth year] Today's Date: 10/11/2018   History of Present Illness  Alaisa Moffitt is a 77 y.o. female with history of bioprosthetic aortic valve replacement, aortofemoral bypass, COPD, sleep apnea, chronic kidney disease hypertension presents to the ER after patient has been having repeated falls over the last 3 days. Found to have elevated troponin.  Clinical Impression  Pt admitted with above complications. Pt currently with functional limitations due to the deficits listed below (see PT Problem List). Demonstrates difficulty with balance, sensory organization, subjective fear of falling, and LE weakness. Ambulates up to 150 feet with rolling walker today. Reviewed home safety and recommendations and that she would likely benefit from home health physical therapy to help with her strength, balance, and general daily function. Orthostatics taken (see tab) however, no subjective dizziness reported, and she has not felt dizzy during the falls. She has had some SOB prior to falling though.  Pt will benefit from skilled PT to increase their independence and safety with mobility to allow discharge to the venue listed below.       Follow Up Recommendations Home health PT;Supervision - Intermittent    Equipment Recommendations  None recommended by PT    Recommendations for Other Services OT consult     Precautions / Restrictions Precautions Precautions: Fall Restrictions Weight Bearing Restrictions: No      Mobility  Bed Mobility Overal bed mobility: Modified Independent             General bed mobility comments: Extra time  Transfers Overall transfer level: Needs assistance Equipment used: Rolling walker (2 wheeled) Transfers: Sit to/from Stand Sit to Stand: Supervision         General transfer comment: supervision for safety, Cues for hand placement. Slow and effortful rise from bed, using  back of knees for support.  Ambulation/Gait Ambulation/Gait assistance: Supervision Gait Distance (Feet): 150 Feet Assistive device: Rolling walker (2 wheeled) Gait Pattern/deviations: Step-through pattern;Decreased stride length Gait velocity: Decreased   General Gait Details: VC for safe DME use with RW. Moderate dyspnea at end of ambulatory bout. Denies dizziness. No overt loss of balance noted, small shuffled steps backwards but able to perform without assist.  Stairs            Wheelchair Mobility    Modified Rankin (Stroke Patients Only)       Balance Overall balance assessment: Needs assistance Sitting-balance support: No upper extremity supported;Feet supported Sitting balance-Leahy Scale: Normal     Standing balance support: No upper extremity supported Standing balance-Leahy Scale: Fair       Tandem Stance - Right Leg: 0 Tandem Stance - Left Leg: 0 Rhomberg - Eyes Opened: 10 Rhomberg - Eyes Closed: 10                 Pertinent Vitals/Pain Pain Assessment: Faces Faces Pain Scale: Hurts little more Pain Location: Neck Pain Descriptors / Indicators: Aching Pain Intervention(s): Monitored during session;Repositioned;Limited activity within patient's tolerance    Home Living Family/patient expects to be discharged to:: Private residence Living Arrangements: Alone Available Help at Discharge: Family(limited) Type of Home: Independent living facility Home Access: Level entry     Home Layout: One level Home Equipment: Environmental consultant - 2 wheels;Walker - 4 wheels Additional Comments: Has been using rollator    Prior Function Level of Independence: Independent with assistive device(s)         Comments: Uses rollator to ambulate     Hand Dominance  Extremity/Trunk Assessment   Upper Extremity Assessment Upper Extremity Assessment: Defer to OT evaluation    Lower Extremity Assessment Lower Extremity Assessment: Generalized weakness        Communication   Communication: No difficulties  Cognition Arousal/Alertness: Awake/alert Behavior During Therapy: WFL for tasks assessed/performed Overall Cognitive Status: Within Functional Limits for tasks assessed                                        General Comments General comments (skin integrity, edema, etc.): Time spent educating patient on home safety and self management to prevent falls.    Exercises     Assessment/Plan    PT Assessment Patient needs continued PT services  PT Problem List Decreased strength;Decreased activity tolerance;Decreased balance;Decreased mobility;Decreased knowledge of use of DME       PT Treatment Interventions DME instruction;Gait training;Functional mobility training;Therapeutic activities;Therapeutic exercise;Balance training;Neuromuscular re-education;Patient/family education    PT Goals (Current goals can be found in the Care Plan section)  Acute Rehab PT Goals Patient Stated Goal: Stop falling PT Goal Formulation: With patient Time For Goal Achievement: 10/25/18 Potential to Achieve Goals: Good    Frequency Min 3X/week   Barriers to discharge Decreased caregiver support lives alone    Co-evaluation               AM-PAC PT "6 Clicks" Mobility  Outcome Measure Help needed turning from your back to your side while in a flat bed without using bedrails?: None Help needed moving from lying on your back to sitting on the side of a flat bed without using bedrails?: None Help needed moving to and from a bed to a chair (including a wheelchair)?: None Help needed standing up from a chair using your arms (e.g., wheelchair or bedside chair)?: A Little Help needed to walk in hospital room?: A Little Help needed climbing 3-5 steps with a railing? : A Lot 6 Click Score: 20    End of Session Equipment Utilized During Treatment: Gait belt;Oxygen Activity Tolerance: Patient tolerated treatment well Patient  left: in bed;with call bell/phone within reach;with bed alarm set;with family/visitor present Nurse Communication: Mobility status PT Visit Diagnosis: Unsteadiness on feet (R26.81);Repeated falls (R29.6);History of falling (Z91.81);Difficulty in walking, not elsewhere classified (R26.2)    Time: 8325-4982 PT Time Calculation (min) (ACUTE ONLY): 38 min   Charges:   PT Evaluation $PT Eval Low Complexity: 1 Low PT Treatments $Gait Training: 8-22 mins $Self Care/Home Management: 8-22        Elayne Snare, PT, DPT  Ellouise Newer 10/11/2018, 3:21 PM

## 2018-10-11 NOTE — Progress Notes (Signed)
  Echocardiogram 2D Echocardiogram has been performed.  Matilde Bash 10/11/2018, 9:49 AM

## 2018-10-12 ENCOUNTER — Observation Stay (HOSPITAL_COMMUNITY): Payer: Medicare HMO

## 2018-10-12 DIAGNOSIS — R55 Syncope and collapse: Secondary | ICD-10-CM | POA: Diagnosis not present

## 2018-10-12 LAB — URINALYSIS, ROUTINE W REFLEX MICROSCOPIC
Bacteria, UA: NONE SEEN
Bilirubin Urine: NEGATIVE
Glucose, UA: NEGATIVE mg/dL
HGB URINE DIPSTICK: NEGATIVE
Ketones, ur: NEGATIVE mg/dL
Nitrite: NEGATIVE
Protein, ur: NEGATIVE mg/dL
Specific Gravity, Urine: 1.005 (ref 1.005–1.030)
pH: 6 (ref 5.0–8.0)

## 2018-10-12 LAB — TROPONIN I: Troponin I: 0.25 ng/mL (ref <0.03)

## 2018-10-12 MED ORDER — SODIUM CHLORIDE 0.9 % IV SOLN
1.0000 g | INTRAVENOUS | Status: DC
  Administered 2018-10-12 – 2018-10-14 (×3): 1 g via INTRAVENOUS
  Filled 2018-10-12 (×3): qty 10

## 2018-10-12 MED ORDER — HYDRALAZINE HCL 20 MG/ML IJ SOLN
10.0000 mg | Freq: Four times a day (QID) | INTRAMUSCULAR | Status: DC | PRN
  Administered 2018-10-12 (×2): 10 mg via INTRAVENOUS
  Filled 2018-10-12 (×2): qty 1

## 2018-10-12 NOTE — Progress Notes (Signed)
Patient refuses CPAP for tonight.

## 2018-10-12 NOTE — Progress Notes (Signed)
Patient ID: Jill Bryant, female   DOB: [birth year], 78 y.o.   MRN: [id number]  PROGRESS NOTE    Jill Bryant  GGY:[id number] DOB: 07/14/1941 DOA: 10/10/2018 PCP: Jill Jews, PA-C    Brief Narrative:  Jill Bryant is a 77 y.o. female with history of bioprosthetic aortic valve replacement, aortofemoral bypass, COPD, sleep apnea, chronic kidney disease hypertension presents to the ER after patient has been having repeated falls over the last 3 days.  Patient states that each day patient had at least one fall.  Patient uses a walker to walk and fell backwards.  Denies losing consciousness or any headache.  Denies any chest pain shortness of breath.  Has been having frequent dizziness.  Patient stated she felt dizzy before the fall.  Patient was therefore admitted for evaluation of presyncope with possible non-ST elevation MI due to elevation of troponin.  Assessment & Plan:   Principal Problem:   Near syncope Active Problems:   S/P aorto-bifemoral bypass surgery   S/P AVR (aortic valve replacement)   CAD (coronary artery disease)   PVC's (premature ventricular contractions)   COPD, severity to be determined (HCC)   OSA on CPAP   Thoracic aortic aneurysm without rupture (HCC)   Elevated troponin   Acute head injury   Hypokalemia   HTN (hypertension)   NSTEMI (non-ST elevated myocardial infarction) ([place])   #1 near syncope:   Work-up so far completed.  Patient has general deconditioning with occasional vertigo.  Had history of inner ear disease.  Training given by physical therapy and counseling given.  She will need PT and OT at home  #2 transient amnesia: Patient woke up this morning forgetting where she was and had generalized confusion.  She had history of bilateral carotid stenosis as well as vasculopathy.  She is scheduled to have outpatient vascular studies which is routinely done including carotids and aortic ultrasound.  Based on these we will check head CT without contrast,  check urinalysis for UTI since she has recurrent UTIs.  Empirically start her on Rocephin.  Once mental status clears up and if no evidence of CVA patient may be discharged home in the morning to follow-up with PCP as well as Dr. Debara Pickett.  #3 elevated troponin: Non-ST elevation MI suspected.  If enzymes continue to rise we may treat as such.  No chest pain however.  #4 coronary artery disease: Stable as per #2.  #5 essential hypertension: Continue home regiment for blood pressure control.  #6 hypokalemia: Replete potassium.  #7 COPD: Continue treatment with empiric nebulizer  #8 obstructive sleep apnea: Patient currently on CPAP at night.  Continue.  #9 morbid obesity: Dietary counseling.   DVT prophylaxis: Lovenox Code Status: Full code Family Communication: Son at bedside Disposition Plan: To be determined   Consultants:   None  Procedures:   Echocardiogram  Antimicrobials:  None   Subjective: A 77 year old female admitted with recurrent presyncopal episodes.  Patient woke up with neurolyse confusion.  She forgot where she is today.  Patient also had some dysuria and worried that she may have UTI.  She did very well walking with physical therapy yesterday.  Recommendations and 20 by physical therapy was given.  Objective: Vitals:   10/12/18 0200 10/12/18 0315 10/12/18 0928 10/12/18 1002  BP: (!) 144/52 (!) 136/56 (!) 139/58   Pulse:  (!) 57 63   Resp:  (!) 21 20   Temp:  97.7 F (36.5 C) 98 F (36.7 C)   TempSrc:  Oral  Oral   SpO2:  98% 99% (!) 88%  Weight:      Height:       No intake or output data in the 24 hours ending 10/12/18 1023 Filed Weights   10/10/18 1745 10/10/18 2110  Weight: 90.7 kg 88.4 kg    Examination:  General exam: Appears calm and comfortable  Respiratory system: Clear to auscultation. Respiratory effort normal. Cardiovascular system: S1 & S2 heard, RRR. No JVD, murmurs, rubs, gallops or clicks. No pedal edema. Gastrointestinal  system: Abdomen is nondistended, soft and nontender. No organomegaly or masses felt. Normal bowel sounds heard. Central nervous system: Alert and oriented. No focal neurological deficits. Extremities: Symmetric 5 x 5 power. Skin: No rashes, lesions or ulcers Psychiatry: Judgement and insight appear normal. Mood & affect appropriate.     Data Reviewed: I have personally reviewed following labs and imaging studies  CBC: Recent Labs  Lab 10/10/18 1823 10/10/18 2241 10/11/18 0347  WBC 7.7 6.1 6.1  HGB 12.6 11.3* 11.0*  HCT 41.2 37.0 36.7  MCV 94.3 93.7 94.8  PLT 196 180 025   Basic Metabolic Panel: Recent Labs  Lab 10/10/18 1823 10/10/18 2241 10/11/18 0347  NA 140  --  142  K 3.6  --  3.4*  CL 102  --  106  CO2 25  --  27  GLUCOSE 84  --  98  BUN 13  --  12  CREATININE 1.44* 1.49* 1.34*  CALCIUM 9.2  --  8.6*   GFR: Estimated Creatinine Clearance: 38.9 mL/min (A) (by C-G formula based on SCr of 1.34 mg/dL (H)). Liver Function Tests: Recent Labs  Lab 10/10/18 1823  AST 24  ALT 12  ALKPHOS 36*  BILITOT 1.0  PROT 6.5  ALBUMIN 3.2*   No results for input(s): LIPASE, AMYLASE in the last 168 hours. No results for input(s): AMMONIA in the last 168 hours. Coagulation Profile: No results for input(s): INR, PROTIME in the last 168 hours. Cardiac Enzymes: Recent Labs  Lab 10/10/18 2241 10/11/18 0347 10/11/18 1103 10/11/18 1758 10/12/18 0107  TROPONINI 0.34* 0.27* 0.24* 0.25* 0.25*   BNP (last 3 results) No results for input(s): PROBNP in the last 8760 hours. HbA1C: No results for input(s): HGBA1C in the last 72 hours. CBG: Recent Labs  Lab 10/10/18 1920  GLUCAP 74   Lipid Profile: No results for input(s): CHOL, HDL, LDLCALC, TRIG, CHOLHDL, LDLDIRECT in the last 72 hours. Thyroid Function Tests: Recent Labs    10/10/18 2241  TSH 1.664   Anemia Panel: No results for input(s): VITAMINB12, FOLATE, FERRITIN, TIBC, IRON, RETICCTPCT in the last 72  hours. Sepsis Labs: No results for input(s): PROCALCITON, LATICACIDVEN in the last 168 hours.  No results found for this or any previous visit (from the past 240 hour(s)).       Radiology Studies: Ct Head Wo Contrast  Result Date: 10/10/2018 CLINICAL DATA:  Multiple recent falls EXAM: CT HEAD WITHOUT CONTRAST TECHNIQUE: Contiguous axial images were obtained from the base of the skull through the vertex without intravenous contrast. COMPARISON:  Head CT 05/12/2013 FINDINGS: Brain: There is no mass, hemorrhage or extra-axial collection. There is generalized atrophy without lobar predilection. There is hypoattenuation of the periventricular white matter, most commonly indicating chronic ischemic microangiopathy. Vascular: No abnormal hyperdensity of the major intracranial arteries or dural venous sinuses. No intracranial atherosclerosis. Skull: The visualized skull base, calvarium and extracranial soft tissues are normal. Sinuses/Orbits: No fluid levels or advanced mucosal thickening of the visualized paranasal  sinuses. No mastoid or middle ear effusion. The orbits are normal. IMPRESSION: 1. No acute intracranial abnormality. 2. Atrophy and chronic ischemic microangiopathy. Electronically Signed   By: Ulyses Jarred M.D.   On: 10/10/2018 19:08   Ct Thoracic Spine Wo Contrast  Result Date: 10/10/2018 CLINICAL DATA:  Multiple recent falls. Upper back pain. EXAM: CT THORACIC SPINE WITHOUT CONTRAST TECHNIQUE: Multidetector CT images of the thoracic were obtained using the standard protocol without intravenous contrast. COMPARISON:  None. FINDINGS: Alignment: Normal. Vertebrae: There is no acute fracture. Mild multilevel central vertebral body height loss, degenerative. No aggressive marrow lesion. Paraspinal and other soft tissues: Focal area of consolidation in the right lower lobe, unchanged compared to 09/08/2018. there is calcific aortic atherosclerosis. Disc levels: No large disc herniation. No  spinal canal stenosis. No thoracic neural foraminal stenosis. IMPRESSION: 1. No acute fracture or static subluxation of the thoracic spine. 2. Unchanged consolidation within the right lower lobe, incompletely visualized. 3.  Aortic atherosclerosis (ICD10-I70.0). Electronically Signed   By: Ulyses Jarred M.D.   On: 10/10/2018 19:04   Dg Chest Port 1 View  Result Date: 10/11/2018 CLINICAL DATA:  Shortness of breath. EXAM: PORTABLE CHEST 1 VIEW COMPARISON:  Radiograph of September 08, 2018. FINDINGS: Stable cardiomegaly. Status post aortic valve replacement. Atherosclerosis of thoracic aorta is noted. No pneumothorax is noted. Left lung is clear. No significant pleural effusion is noted. Stable right basilar atelectasis, pneumonia or scarring is noted. Bony thorax is unremarkable. IMPRESSION: Stable right basilar opacity as described above. No new abnormality is noted. Aortic Atherosclerosis (ICD10-I70.0). Electronically Signed   By: Marijo Conception, M.D.   On: 10/11/2018 09:28        Scheduled Meds: . aspirin EC  81 mg Oral Daily  . calcium carbonate  1,250 mg Oral BID WC  . cholecalciferol  2,000 Units Oral Daily  . enoxaparin (LOVENOX) injection  40 mg Subcutaneous Q24H  . ferrous sulfate  325 mg Oral Q breakfast  . FLUoxetine  20 mg Oral Daily  . gabapentin  600 mg Oral QHS  . irbesartan  75 mg Oral Daily  . levothyroxine  100 mcg Oral Q24H  . loratadine  10 mg Oral Daily  . omega-3 acid ethyl esters  1,000 mg Oral Daily  . pantoprazole  40 mg Oral Daily  . pravastatin  20 mg Oral QHS  . umeclidinium bromide  1 puff Inhalation Daily  . vitamin B-12  1,000 mcg Oral Daily   Continuous Infusions:    LOS: 0 days    Time spent: 81 minutes    Charitie Hinote,LAWAL, MD [facility] Pager [phone] If 7PM-7AM, please contact night-coverage [email or web address] Password [facility] 10/12/2018, 10:23 AM

## 2018-10-12 NOTE — Progress Notes (Signed)
BP 180/80, patient c/o mild headache, "wavy" vision. PRN hydralazine given, MD notified. Will continue to monitor

## 2018-10-13 ENCOUNTER — Encounter (HOSPITAL_COMMUNITY): Payer: Self-pay | Admitting: Student

## 2018-10-13 DIAGNOSIS — R55 Syncope and collapse: Secondary | ICD-10-CM

## 2018-10-13 DIAGNOSIS — R7989 Other specified abnormal findings of blood chemistry: Secondary | ICD-10-CM

## 2018-10-13 DIAGNOSIS — I272 Pulmonary hypertension, unspecified: Secondary | ICD-10-CM

## 2018-10-13 DIAGNOSIS — J449 Chronic obstructive pulmonary disease, unspecified: Secondary | ICD-10-CM | POA: Diagnosis not present

## 2018-10-13 DIAGNOSIS — I2510 Atherosclerotic heart disease of native coronary artery without angina pectoris: Secondary | ICD-10-CM

## 2018-10-13 DIAGNOSIS — I10 Essential (primary) hypertension: Secondary | ICD-10-CM | POA: Diagnosis not present

## 2018-10-13 LAB — URINE CULTURE: Culture: 20000 — AB

## 2018-10-13 NOTE — Progress Notes (Signed)
Patient refuses CPAP for the night

## 2018-10-13 NOTE — Progress Notes (Signed)
PROGRESS NOTE    Jill Bryant  ATF:[id number] DOB: [birth year] DOA: 10/10/2018 PCP: Curt Jews, PA-C   Brief Narrative: Jill Bryant is a 77 y.o. femalewithhistory of bioprosthetic aortic valve replacement, aortofemoral bypass, COPD, sleep apnea, chronic kidney disease hypertension. Patient presented with near syncope. CT scan obtained on admission and two days later without acute changes.   Assessment & Plan:   Principal Problem:   Near syncope Active Problems:   S/P aorto-bifemoral bypass surgery   S/P AVR (aortic valve replacement)   CAD (coronary artery disease)   PVC's (premature ventricular contractions)   COPD, severity to be determined (HCC)   OSA on CPAP   Thoracic aortic aneurysm without rupture (HCC)   Elevated troponin   Acute head injury   Hypokalemia   HTN (hypertension)   NSTEMI (non-ST elevated myocardial infarction) ([place])   Near yncope Possibly related to debilitation. History of aortic valve stenosis s/p AVR. Transthoracic Echocardiogram performed without complications seen with AVR. EKG significant for new RBBB. Troponin has been elevated but trending down since admission. No current or prior chest pain. PT/OT recommending home health. -Cardiology consult prior to considering discharge  Transient amnesia Noted by previous provider. Urine culture obtained which showed multiple species with only 20,000 colony units  Elevated troponin No chest pain with associated new RBBB but no ST-T changes. RBBB is new. -Cardiology recommendations as mentioned above  CAD No chest pain. Above problems.  Essential hypertension -Continue irbesartan  COPD Sable -nebulizer as needed  OSA Patient is supposed to use CPAP at night but does not. Has evidence of elevated PA pressures on Transthoracic Echocardiogram. Patient follows with pulmonology as an outpatient. -Continue CPAP at night  Obesity Body mass index is 30.52 kg/m.   DVT prophylaxis:  Lovenoxx Code Status:   Code Status: Full Code Family Communication: Son at bedside Disposition Plan: Discharge likely in 24 hours home after cardiology evaluation/management   Consultants:   Cardiology  Procedures:   Transthoracic Echocardiogram (12/14) Study Conclusions  - Left ventricle: The cavity size was normal. Wall thickness was   increased in a pattern of mild LVH. Systolic function was normal.   The estimated ejection fraction was in the range of 55% to 60%.   Wall motion was normal; there were no regional wall motion   abnormalities. Doppler parameters are consistent with abnormal   left ventricular relaxation (grade 1 diastolic dysfunction). - Aortic valve: 23 mm bovine pericardial prosthesis in aortic   postion. There was no significant regurgitation. Mean gradient   (S): 5 mm Hg. Valve area (VTI): 2.74 cm^2. - Mitral valve: Moderately calcified annulus. There was trivial   regurgitation. - Right ventricle: The cavity size was mildly dilated. - Right atrium: The atrium was at the upper limits of normal in   size. Central venous pressure (est): 3 mm Hg. - Atrial septum: No defect or patent foramen ovale was identified. - Tricuspid valve: There was trivial regurgitation. - Pulmonary arteries: Systolic pressure was moderately to severely   increased. PA peak pressure: 59 mm Hg (S). - Pericardium, extracardiac: There was no pericardial effusion.  Antimicrobials:  Ceftriaxone    Subjective: No concerns today  Objective: Vitals:   10/13/18 0425 10/13/18 0729 10/13/18 1259 10/13/18 2016  BP: (!) 155/61 (!) 151/67 (!) 168/69 (!) 158/69  Pulse: 94 76 75 66  Resp: 16 18  20   Temp: 98.4 F (36.9 C) 98.9 F (37.2 C)  97.9 F (36.6 C)  TempSrc: Oral Oral  Oral  SpO2: 95% 91% 92% 95%  Weight:      Height:        Intake/Output Summary (Last 24 hours) at 10/13/2018 2030 Last data filed at 10/13/2018 0616 Gross per 24 hour  Intake 0 ml  Output -  Net 0 ml    Filed Weights   10/10/18 1745 10/10/18 2110  Weight: 90.7 kg 88.4 kg    Examination:  General exam: Appears calm and comfortable  Respiratory system: Clear to auscultation. Respiratory effort normal. Cardiovascular system: S1 & S2 heard, RRR. No murmurs, rubs, gallops or clicks. Gastrointestinal system: Abdomen is nondistended, soft and nontender. Normal bowel sounds heard. Central nervous system: Alert and oriented. No focal neurological deficits. Extremities: No calf tenderness Skin: No cyanosis. No rashes Psychiatry: Judgement and insight appear normal. Mood & affect appropriate.     Data Reviewed: I have personally reviewed following labs and imaging studies  CBC: Recent Labs  Lab 10/10/18 1823 10/10/18 2241 10/11/18 0347  WBC 7.7 6.1 6.1  HGB 12.6 11.3* 11.0*  HCT 41.2 37.0 36.7  MCV 94.3 93.7 94.8  PLT 196 180 045   Basic Metabolic Panel: Recent Labs  Lab 10/10/18 1823 10/10/18 2241 10/11/18 0347  NA 140  --  142  K 3.6  --  3.4*  CL 102  --  106  CO2 25  --  27  GLUCOSE 84  --  98  BUN 13  --  12  CREATININE 1.44* 1.49* 1.34*  CALCIUM 9.2  --  8.6*   GFR: Estimated Creatinine Clearance: 38.9 mL/min (A) (by C-G formula based on SCr of 1.34 mg/dL (H)). Liver Function Tests: Recent Labs  Lab 10/10/18 1823  AST 24  ALT 12  ALKPHOS 36*  BILITOT 1.0  PROT 6.5  ALBUMIN 3.2*   No results for input(s): LIPASE, AMYLASE in the last 168 hours. No results for input(s): AMMONIA in the last 168 hours. Coagulation Profile: No results for input(s): INR, PROTIME in the last 168 hours. Cardiac Enzymes: Recent Labs  Lab 10/10/18 2241 10/11/18 0347 10/11/18 1103 10/11/18 1758 10/12/18 0107  TROPONINI 0.34* 0.27* 0.24* 0.25* 0.25*   BNP (last 3 results) No results for input(s): PROBNP in the last 8760 hours. HbA1C: No results for input(s): HGBA1C in the last 72 hours. CBG: Recent Labs  Lab 10/10/18 1920  GLUCAP 74   Lipid Profile: No results  for input(s): CHOL, HDL, LDLCALC, TRIG, CHOLHDL, LDLDIRECT in the last 72 hours. Thyroid Function Tests: Recent Labs    10/10/18 2241  TSH 1.664   Anemia Panel: No results for input(s): VITAMINB12, FOLATE, FERRITIN, TIBC, IRON, RETICCTPCT in the last 72 hours. Sepsis Labs: No results for input(s): PROCALCITON, LATICACIDVEN in the last 168 hours.  Recent Results (from the past 240 hour(s))  Culture, Urine     Status: Abnormal   Collection Time: 10/12/18 10:17 AM  Result Value Ref Range Status   Specimen Description URINE, CLEAN CATCH  Final   Special Requests   Final    NONE Performed at [facility], [address], [place], [place] [address]    Culture (A)  Final    20,000 COLONIES/mL MULTIPLE SPECIES PRESENT, SUGGEST RECOLLECTION   Report Status 10/13/2018 FINAL  Final         Radiology Studies: Ct Head Wo Contrast  Result Date: 10/12/2018 CLINICAL DATA:  Encephalopathy. Multiple recent falls. EXAM: CT HEAD WITHOUT CONTRAST TECHNIQUE: Contiguous axial images were obtained from the base of the skull through the  vertex without intravenous contrast. COMPARISON:  CT head 10/10/2018. FINDINGS: Brain: No evidence for acute infarction, hemorrhage, mass lesion, hydrocephalus, or extra-axial fluid. Generalized atrophy. Slight hypoattenuation of white matter, consistent with small vessel disease. Vascular: Calcification of the cavernous internal carotid arteries consistent with cerebrovascular atherosclerotic disease. No signs of intracranial large vessel occlusion. Skull: Calvarium intact. Sinuses/Orbits: No acute findings. BILATERAL cataract extraction. Other: No temporal bone inflammatory process. Scalp soft tissues unremarkable. Compared with priors, similar appearance. IMPRESSION: Atrophy and small vessel disease. No acute intracranial findings. Similar appearance compared with 10/10/2018. Electronically Signed   By: Staci Righter M.D.   On: 10/12/2018 13:00         Scheduled Meds: . aspirin EC  81 mg Oral Daily  . calcium carbonate  1,250 mg Oral BID WC  . cholecalciferol  2,000 Units Oral Daily  . enoxaparin (LOVENOX) injection  40 mg Subcutaneous Q24H  . ferrous sulfate  325 mg Oral Q breakfast  . FLUoxetine  20 mg Oral Daily  . gabapentin  600 mg Oral QHS  . irbesartan  75 mg Oral Daily  . levothyroxine  100 mcg Oral Q24H  . loratadine  10 mg Oral Daily  . omega-3 acid ethyl esters  1,000 mg Oral Daily  . pantoprazole  40 mg Oral Daily  . pravastatin  20 mg Oral QHS  . umeclidinium bromide  1 puff Inhalation Daily  . vitamin B-12  1,000 mcg Oral Daily   Continuous Infusions: . cefTRIAXone (ROCEPHIN)  IV 1 g (10/13/18 1430)     LOS: 0 days     Cordelia Poche, MD [facility] 10/13/2018, 8:30 PM  If 7PM-7AM, please contact night-coverage [email or web address]

## 2018-10-13 NOTE — Progress Notes (Signed)
Physical Therapy Treatment Patient Details Name: Jill Bryant MRN: [id number] DOB: [birth year] Today's Date: 10/13/2018    History of Present Illness Jill Bryant is a 77 y.o. female with history of bioprosthetic aortic valve replacement, aortofemoral bypass, COPD, sleep apnea, chronic kidney disease hypertension presents to the ER after patient has been having repeated falls over the last 3 days. Found to have elevated troponin.    PT Comments    Pt performed gait training and functional mobility.  She complains of pain on L knee from her bakers cyst but this did not limit her functionally.  Pt remains in agreement that her care needs can be met at home.  She is agreeable to HHPT.  Performed tx on RA and patient did desaturate, see O2 note in Epic.  Plan for return home with HHPT.      Follow Up Recommendations  Home health PT;Supervision - Intermittent     Equipment Recommendations  3in1 (PT)(portable O2 tank.)    Recommendations for Other Services       Precautions / Restrictions Precautions Precautions: Fall Restrictions Weight Bearing Restrictions: No    Mobility  Bed Mobility               General bed mobility comments: Pt in recliner on arrival.    Transfers Overall transfer level: Needs assistance Equipment used: Rolling walker (2 wheeled) Transfers: Sit to/from Stand Sit to Stand: Supervision         General transfer comment: supervision for safety, Cues for hand placement. Slow and effortful rise from bed, using back of knees for support.  Ambulation/Gait Ambulation/Gait assistance: Min guard Gait Distance (Feet): 170 Feet Assistive device: Rolling walker (2 wheeled) Gait Pattern/deviations: Step-through pattern;Decreased stride length Gait velocity: Decreased   General Gait Details: VC for safe DME use with RW. Moderate dyspnea at end of ambulatory bout. Denies dizziness. No overt loss of balance noted, small shuffled steps backwards but able to  perform.  Pt on RA and desaturated to 84%, required 3L to improve O2 sats to 91%.     Stairs             Wheelchair Mobility    Modified Rankin (Stroke Patients Only)       Balance Overall balance assessment: Needs assistance Sitting-balance support: No upper extremity supported;Feet supported Sitting balance-Leahy Scale: Normal       Standing balance-Leahy Scale: Fair                              Cognition Arousal/Alertness: Awake/alert Behavior During Therapy: WFL for tasks assessed/performed Overall Cognitive Status: Within Functional Limits for tasks assessed                                        Exercises General Exercises - Lower Extremity Long Arc Quad: AROM;Both;10 reps;Seated Hip Flexion/Marching: AROM;10 reps;Standing;Seated;Both(1x10 seated and 1x10 standing. ) Heel Raises: AROM;10 reps;Standing;Both Mini-Sqauts: AROM;10 reps;Standing;Both    General Comments        Pertinent Vitals/Pain Pain Assessment: Faces Faces Pain Scale: Hurts little more Pain Location: L knee ( bakers cyst ) Pain Intervention(s): Monitored during session;Repositioned    Home Living                      Prior Function  PT Goals (current goals can now be found in the care plan section) Acute Rehab PT Goals Patient Stated Goal: Stop falling Potential to Achieve Goals: Good Progress towards PT goals: Progressing toward goals    Frequency    Min 3X/week      PT Plan Current plan remains appropriate    Co-evaluation              AM-PAC PT "6 Clicks" Mobility   Outcome Measure  Help needed turning from your back to your side while in a flat bed without using bedrails?: None Help needed moving from lying on your back to sitting on the side of a flat bed without using bedrails?: None Help needed moving to and from a bed to a chair (including a wheelchair)?: None Help needed standing up from a chair using  your arms (e.g., wheelchair or bedside chair)?: A Little Help needed to walk in hospital room?: A Little Help needed climbing 3-5 steps with a railing? : A Lot 6 Click Score: 20    End of Session Equipment Utilized During Treatment: Gait belt Activity Tolerance: Patient tolerated treatment well Patient left: with call bell/phone within reach;with family/visitor present;in chair Nurse Communication: Mobility status PT Visit Diagnosis: Unsteadiness on feet (R26.81);Repeated falls (R29.6);History of falling (Z91.81);Difficulty in walking, not elsewhere classified (R26.2)     Time: 0312-8118 PT Time Calculation (min) (ACUTE ONLY): 23 min  Charges:  $Gait Training: 8-22 mins $Therapeutic Exercise: 8-22 mins                     Governor Rooks, PTA Acute Rehabilitation Services Pager [phone] Office [phone]     Jill Bryant 10/13/2018, 1:33 PM

## 2018-10-13 NOTE — Progress Notes (Signed)
SATURATION QUALIFICATIONS: (This note is used to comply with regulatory documentation for home oxygen)  Patient Saturations on Room Air at Rest = 95%  Patient Saturations on Room Air while Ambulating = 84%  Patient Saturations on 3 Liters of oxygen while Ambulating =91%  Please briefly explain why patient needs home oxygen: desaturation with activity.    Governor Rooks, PTA Acute Rehabilitation Services Pager [phone] Office [phone]

## 2018-10-13 NOTE — Consult Note (Signed)
Cardiology Consult    Patient ID: Jill Bryant, DOB/AGE: [birth year]   Admit date: 10/10/2018 Date of Consult: 10/13/2018  Primary Physician: Curt Jews, PA-C Primary Cardiologist: Pixie Casino, MD  Patient Profile    Jill Bryant is a 77 y.o. female with a history of non-obstructive CAD on heart catheterization in 2013, severe aortic stenosis s/p aortic valve replacement with a 50mm CE bovine pericardial aortic valve at [facility] in 2013, PAD with significant aortic occlusion s/p aortobifem bypass in 2002, hypertension, COPD, obstructive sleep apnea on CPAP and home oxygen, and CKD who is being seen today for the evaluation of syncope at the request of Dr. Jonelle Sidle.  History of Present Illness    Jill Bryant is a 78 year old female with the above history who is followed by Dr. Debara Pickett. Patient was last seen by Dr. Debara Pickett on 02/03/2018 at which time patient was without any complaints other than feeling fatigued on the day of visit. She was found to be hypotensive with BP of 89/59. Patient was advised to take Lasix only as needed and to continue to monitor BP at home.   Patient presented to the [facility] ED on 10/10/2018 for evaluation of multiple falls over the last few days. Patient ambulates with a walker and reports falling 3 times in the last 4 days prior to admission. She denies any prodromal symptoms including chest pain, worsening shortness of breath from baseline, palpitations, lightheadedness/dizziness, or major vision changes. Patient also denies tripping over anything. She is not sure what is causing her to fall. However, each time she fell she fell backwards hitting her head on the floor. She denies any loss of consciousness. Patient states she has felt more fatigued and weak over the last couple months which has caused her to be more sedentary. She does report occasional episode of very light fluttering in her chest but states it does not last long. She  also reports having a "wavy line" in her vision occasionally. Per chart review, patient has complained of this during admission when her BP was 180/80.  Patient denies any history of recent chest pain. She denies any recent fevers or illness. She reports a slight decrease in appetite recently but states she is drinking plenty of water.   Upon arrival to the ED, EKD showed sinus rhythm with no acute ischemic changed. Initial troponin was mildly elevated at 0.42. Chest x-ray showed focal opacity right lower lob on lateral view. CBC was unremarkable. Na 140, K 3.6, Glucose 84, SCr 1.44. Head CT showed atrophy and chronic ischemic microangiography but no acute intracranial abnormality.  Past Medical History   Past Medical History:  Diagnosis Date  . Anemia   . Anxiety   . Arthritis   . Asthma   . Back pain, chronic   . CHF (congestive heart failure) ([place])   . COPD (chronic obstructive pulmonary disease) ([place])   . Depression   . GERD (gastroesophageal reflux disease)   . Headache    migraines  . Hyperlipidemia   . Hypertension   . Hypothyroidism   . 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 Lumbar stenosis   . Pneumonia   . PONV (postoperative nausea and vomiting)   . Retinal tear    right eye  . Seizures ([place])    with morphine  . Shortness of breath dyspnea    with exertion  . Sleep apnea    wears CPAP  .  TIA (transient ischemic attack)   . UTI (lower urinary tract infection)   . Wears glasses     Past Surgical History:  Procedure Laterality Date  . aortic endarterectomy    . aortobifemoral bypass    . BREAST SURGERY     cyst aspiration right breast  . CARDIAC SURGERY     valve replacement 2013; aorta replacement 2002  . CATARACT EXTRACTION W/ INTRAOCULAR LENS  IMPLANT, BILATERAL    . CHOLECYSTECTOMY    . COLONOSCOPY    . HERNIA REPAIR    . LUMBAR EPIDURAL INJECTION    . LUMBAR LAMINECTOMY/DECOMPRESSION MICRODISCECTOMY Left 02/27/2016   Procedure: Laminectomy and  Foraminotomy - Lumbar four-five -Lumbar three-four - left diskectomy Lumbar three-four;  Surgeon: Kary Kos, MD;  Location: [place] [facility]UR[facility] ORS;  Service: Neurosurgery;  Laterality: Left;  Marland Kitchen MULTIPLE TOOTH EXTRACTIONS    . TUBAL LIGATION       Allergies  Allergies  Allergen Reactions  . Tape Rash and Other (See Comments)    SKIN IS VERY THIN; TAPE TEARS THE SKIN!!  . Amlodipine Swelling and Other (See Comments)    Feet/toes swelling  . Metoprolol Other (See Comments)    Bradycardia and fatigue  . Morphine And Related Other (See Comments)    Hallucinations/seizures    Inpatient Medications    . aspirin EC  81 mg Oral Daily  . calcium carbonate  1,250 mg Oral BID WC  . cholecalciferol  2,000 Units Oral Daily  . enoxaparin (LOVENOX) injection  40 mg Subcutaneous Q24H  . ferrous sulfate  325 mg Oral Q breakfast  . FLUoxetine  20 mg Oral Daily  . gabapentin  600 mg Oral QHS  . irbesartan  75 mg Oral Daily  . levothyroxine  100 mcg Oral Q24H  . loratadine  10 mg Oral Daily  . omega-3 acid ethyl esters  1,000 mg Oral Daily  . pantoprazole  40 mg Oral Daily  . pravastatin  20 mg Oral QHS  . umeclidinium bromide  1 puff Inhalation Daily  . vitamin B-12  1,000 mcg Oral Daily    Family History    Family History  Problem Relation Age of Onset  . Other Mother   . Alzheimer's disease Father   . Heart disease Maternal Grandmother   . Heart disease Maternal Aunt    She indicated that her mother is deceased. She indicated that her father is deceased. She indicated that her maternal grandmother is deceased. She indicated that her maternal grandfather is deceased. She indicated that her paternal grandmother is deceased. She indicated that her paternal grandfather is deceased. She indicated that the status of her maternal aunt is unknown.   Social History    Social History   Socioeconomic History  . Marital status: Widowed    Spouse name: Not on file  . Number of children: Not on file    . Years of education: Not on file  . Highest education level: Not on file  Occupational History  . Not on file  Social Needs  . Financial resource strain: Not on file  . Food insecurity:    Worry: Not on file    Inability: Not on file  . Transportation needs:    Medical: Not on file    Non-medical: Not on file  Tobacco Use  . Smoking status: Former Smoker    Last attempt to quit: 10/30/2003    Years since quitting: 14.9  . Smokeless tobacco: Never Used  Substance and Sexual  Activity  . Alcohol use: No  . Drug use: No  . Sexual activity: Not on file  Lifestyle  . Physical activity:    Days per week: Not on file    Minutes per session: Not on file  . Stress: Not on file  Relationships  . Social connections:    Talks on phone: Not on file    Gets together: Not on file    Attends religious service: Not on file    Active member of club or organization: Not on file    Attends meetings of clubs or organizations: Not on file    Relationship status: Not on file  . Intimate partner violence:    Fear of current or ex partner: Not on file    Emotionally abused: Not on file    Physically abused: Not on file    Forced sexual activity: Not on file  Other Topics Concern  . Not on file  Social History Narrative  . Not on file     Review of Systems    Review of Systems  Constitutional: Positive for malaise/fatigue. Negative for chills, diaphoresis and fever.  HENT: Negative for congestion.   Eyes: Negative for blurred vision and double vision.  Respiratory: Positive for shortness of breath (at baseline). Negative for hemoptysis.   Cardiovascular: Positive for palpitations. Negative for chest pain, orthopnea, leg swelling and PND.  Gastrointestinal: Positive for abdominal pain. Negative for blood in stool, nausea and vomiting.  Genitourinary: Negative for hematuria.  Musculoskeletal: Positive for myalgias.  Neurological: Positive for weakness. Negative for dizziness and loss of  consciousness.  Endo/Heme/Allergies: Does not bruise/bleed easily.  Psychiatric/Behavioral: Negative for substance abuse.    Physical Exam    Blood pressure (!) 168/69, pulse 75, temperature 98.9 F (37.2 C), temperature source Oral, resp. rate 18, height 5\' 7"  (1.702 m), weight 88.4 kg, SpO2 92 %.  General: 77 y.o. obese Caucasian female resting comfortably in no acute distress. Pleasant and cooperative. HEENT: Normal  Neck: Supple. No carotid bruits or JVD appreciated. Lungs: No increased work of breathing. Clear to auscultation bilaterally. No wheezes, rhonchi, or rales. Heart: RRR. Distinct S1 and S2. Soft systolic murmur. No gallops or rubs.  Abdomen: Soft, non-distended, and non-tender to palpation. Bowel sounds present in all 4 quadrants.   Extremities: No significant lower extremity edema. Radial pulses and distal pedal pulses 2+ and equal bilaterally. Neuro: Alert and oriented x3. No focal deficits. Moves all extremities spontaneously. Psych: Normal affect.  Labs    Troponin (Point of Care Test) No results for input(s): TROPIPOC in the last 72 hours. Recent Labs    10/11/18 0347 10/11/18 1103 10/11/18 1758 10/12/18 0107  TROPONINI 0.27* 0.24* 0.25* 0.25*   Lab Results  Component Value Date   WBC 6.1 10/11/2018   HGB 11.0 (L) 10/11/2018   HCT 36.7 10/11/2018   MCV 94.8 10/11/2018   PLT 196 10/11/2018    Recent Labs  Lab 10/10/18 1823  10/11/18 0347  NA 140  --  142  K 3.6  --  3.4*  CL 102  --  106  CO2 25  --  27  BUN 13  --  12  CREATININE 1.44*   < > 1.34*  CALCIUM 9.2  --  8.6*  PROT 6.5  --   --   BILITOT 1.0  --   --   ALKPHOS 36*  --   --   ALT 12  --   --   AST 24  --   --  GLUCOSE 84  --  98   < > = values in this interval not displayed.   Lab Results  Component Value Date   CHOL 166 05/10/2015   HDL 44 05/10/2015   LDLCALC 88 05/10/2015   TRIG 172 (H) 05/10/2015   No results found for: [facility]   Radiology Studies    Ct Head Wo  Contrast  Result Date: 10/12/2018 CLINICAL DATA:  Encephalopathy. Multiple recent falls. EXAM: CT HEAD WITHOUT CONTRAST TECHNIQUE: Contiguous axial images were obtained from the base of the skull through the vertex without intravenous contrast. COMPARISON:  CT head 10/10/2018. FINDINGS: Brain: No evidence for acute infarction, hemorrhage, mass lesion, hydrocephalus, or extra-axial fluid. Generalized atrophy. Slight hypoattenuation of white matter, consistent with small vessel disease. Vascular: Calcification of the cavernous internal carotid arteries consistent with cerebrovascular atherosclerotic disease. No signs of intracranial large vessel occlusion. Skull: Calvarium intact. Sinuses/Orbits: No acute findings. BILATERAL cataract extraction. Other: No temporal bone inflammatory process. Scalp soft tissues unremarkable. Compared with priors, similar appearance. IMPRESSION: Atrophy and small vessel disease. No acute intracranial findings. Similar appearance compared with 10/10/2018. Electronically Signed   By: Staci Righter M.D.   On: 10/12/2018 13:00   Ct Head Wo Contrast  Result Date: 10/10/2018 CLINICAL DATA:  Multiple recent falls EXAM: CT HEAD WITHOUT CONTRAST TECHNIQUE: Contiguous axial images were obtained from the base of the skull through the vertex without intravenous contrast. COMPARISON:  Head CT 05/12/2013 FINDINGS: Brain: There is no mass, hemorrhage or extra-axial collection. There is generalized atrophy without lobar predilection. There is hypoattenuation of the periventricular white matter, most commonly indicating chronic ischemic microangiopathy. Vascular: No abnormal hyperdensity of the major intracranial arteries or dural venous sinuses. No intracranial atherosclerosis. Skull: The visualized skull base, calvarium and extracranial soft tissues are normal. Sinuses/Orbits: No fluid levels or advanced mucosal thickening of the visualized paranasal sinuses. No mastoid or middle ear effusion.  The orbits are normal. IMPRESSION: 1. No acute intracranial abnormality. 2. Atrophy and chronic ischemic microangiopathy. Electronically Signed   By: Ulyses Jarred M.D.   On: 10/10/2018 19:08   Ct Thoracic Spine Wo Contrast  Result Date: 10/10/2018 CLINICAL DATA:  Multiple recent falls. Upper back pain. EXAM: CT THORACIC SPINE WITHOUT CONTRAST TECHNIQUE: Multidetector CT images of the thoracic were obtained using the standard protocol without intravenous contrast. COMPARISON:  None. FINDINGS: Alignment: Normal. Vertebrae: There is no acute fracture. Mild multilevel central vertebral body height loss, degenerative. No aggressive marrow lesion. Paraspinal and other soft tissues: Focal area of consolidation in the right lower lobe, unchanged compared to 09/08/2018. there is calcific aortic atherosclerosis. Disc levels: No large disc herniation. No spinal canal stenosis. No thoracic neural foraminal stenosis. IMPRESSION: 1. No acute fracture or static subluxation of the thoracic spine. 2. Unchanged consolidation within the right lower lobe, incompletely visualized. 3.  Aortic atherosclerosis (ICD10-I70.0). Electronically Signed   By: Ulyses Jarred M.D.   On: 10/10/2018 19:04   Dg Chest Port 1 View  Result Date: 10/11/2018 CLINICAL DATA:  Shortness of breath. EXAM: PORTABLE CHEST 1 VIEW COMPARISON:  Radiograph of September 08, 2018. FINDINGS: Stable cardiomegaly. Status post aortic valve replacement. Atherosclerosis of thoracic aorta is noted. No pneumothorax is noted. Left lung is clear. No significant pleural effusion is noted. Stable right basilar atelectasis, pneumonia or scarring is noted. Bony thorax is unremarkable. IMPRESSION: Stable right basilar opacity as described above. No new abnormality is noted. Aortic Atherosclerosis (ICD10-I70.0). Electronically Signed   By: Marijo Conception, M.D.  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   Telemetry: Telemetry was personally reviewed and demonstrates: Sinus rhythm with heart rate in the 60's to 80's.   Cardiac Imaging    Echocardiogram 10/11/2018: Study Conclusions: - Left ventricle: The cavity size was normal. Wall thickness was   increased in a pattern of mild LVH. Systolic function was normal.   The estimated ejection fraction was in the range of 55% to 60%.   Wall motion was normal; there were no regional wall motion   abnormalities. Doppler parameters are consistent with abnormal   left ventricular relaxation (grade 1 diastolic dysfunction). - Aortic valve: 23 mm bovine pericardial prosthesis in aortic   postion. There was no significant regurgitation. Mean gradient   (S): 5 mm Hg. Valve area (VTI): 2.74 cm^2. - Mitral valve: Moderately calcified annulus. There was trivial   regurgitation. - Right ventricle: The cavity size was mildly dilated. - Right atrium: The atrium was at the upper limits of normal in   size. Central venous pressure (est): 3 mm Hg. - Atrial septum: No defect or patent foramen ovale was identified. - Tricuspid valve: There was trivial regurgitation. - Pulmonary arteries: Systolic pressure was moderately to severely   increased. PA peak pressure: 59 mm Hg (S). - Pericardium, extracardiac: There was no pericardial effusion.  _______________  Vascular Ultrasound Carotid Duplex 03/31/2018: Final Interpretation:  Right Carotid: Velocities in the right ICA are consistent with a 1-39% stenosis.         Non-hemodynamically significant plaque <50% noted in the CCA. The         RICA velocities remain within normal range and have decreased         compared to the prior exam.    Left Carotid: Velocities in the left ICA are consistent with a 1-39% stenosis.        Non-hemodynamically significant plaque noted in the CCA.  The LICA        velocities remain within normal range and have decreased compared        to the prior exam.    Vertebrals: Right vertebral artery demonstrates antegrade flow. Antegrade,        aberrant flow in the left vertebral artery.  Subclavians: Bilateral subclavian arteries were stenotic. Bilateral subclavian        artery flow was disturbed.          The right brachial artery is triphasic. The left brachial artery is        biphasic.  _______________  Vascular Ultrasound ABI 03/31/2018: Final Interpretation:  Right: Resting right ankle-brachial index is within normal range. No evidence of significant right lower extremity arterial disease. The right toe-brachial index is abnormal.      Left: Resting left ankle-brachial index is within normal range. No evidence of significant left lower extremity arterial disease. The left toe-brachial index is abnormal.  _______________  Vascular Ultrasound Aorta/IVC/Iliacs 03/31/2018: Final Interpretation:  Abdominal Aorta: The largest aortic measurement is 2.1 cm.    Widely patent iliac portion and distal anastomoses of the AFBG.    Patent IVC.    Assessment & Plan    Fall - Patient presented to the ED on 10/10/2018 for evaluation of multiple falls in the last few days. Patient denies any prodromal symptoms.  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 - Telemetry shows sinus rhythm with no significant arrhythmias. - Echo showed LVEF of 55-60% with no regional wall motion abnormalities and grade 1 diastolic dysfunction.  - Patient is not currently orthostatic. - Does not sound cardiac in nature. Per Internal Medicine note, falls are felt to be due to general deconditioning with possible vertigo. PT and OT have been recommended.  Elevated Troponin - EKG showed no acute ischemic changes. - Troponin  minimally elevated at 0.42 >> 0.34 >> 0.27 >> 0.24 >>  0.25 >> 0.25. Not consistent with ACS. Possible supple demand in setting of hypertension. - Will V/Q scan to rule out PE given elevated pulmonary pressure on Echo. If negative, no further cardiac workup is necessary.  Hypertension - Most recent BP 168/69. - Continue Irbesartan. Consider up-titrating dose. - Continue IV Hydralazine as needed.  Severe Aortic Stenosis s/p Aortic Valve Replacement  - Patient has a history of severe aortic stenosis s/p  aortic valve replacement with a 22mm CE bovine pericardial aortic valve at [facility] in 2013. - Echo showed mean gradient of 5 mmHg.   Signed, Darreld Mclean, PA-C 10/13/2018, 5:50 PM  For questions or updates, please contact   Please consult [email or web address] for contact info under Cardiology/STEMI.

## 2018-10-14 ENCOUNTER — Observation Stay (HOSPITAL_COMMUNITY): Payer: Medicare HMO

## 2018-10-14 DIAGNOSIS — I10 Essential (primary) hypertension: Secondary | ICD-10-CM

## 2018-10-14 DIAGNOSIS — I272 Pulmonary hypertension, unspecified: Secondary | ICD-10-CM

## 2018-10-14 DIAGNOSIS — I2510 Atherosclerotic heart disease of native coronary artery without angina pectoris: Secondary | ICD-10-CM | POA: Diagnosis not present

## 2018-10-14 DIAGNOSIS — R7989 Other specified abnormal findings of blood chemistry: Secondary | ICD-10-CM | POA: Diagnosis not present

## 2018-10-14 DIAGNOSIS — R55 Syncope and collapse: Secondary | ICD-10-CM | POA: Diagnosis not present

## 2018-10-14 MED ORDER — DICLOFENAC SODIUM 1 % TD GEL
4.0000 g | Freq: Four times a day (QID) | TRANSDERMAL | Status: DC
  Filled 2018-10-14: qty 100

## 2018-10-14 MED ORDER — TECHNETIUM TC 99M DIETHYLENETRIAME-PENTAACETIC ACID
30.0000 | Freq: Once | INTRAVENOUS | Status: AC | PRN
  Administered 2018-10-14: 30 via RESPIRATORY_TRACT

## 2018-10-14 MED ORDER — TECHNETIUM TO 99M ALBUMIN AGGREGATED
4.0000 | Freq: Once | INTRAVENOUS | Status: AC | PRN
  Administered 2018-10-14: 4 via INTRAVENOUS

## 2018-10-14 MED ORDER — DICLOFENAC SODIUM 1 % TD GEL: 4.0000 g | Freq: Four times a day (QID) | TRANSDERMAL | 0 refills | Status: AC

## 2018-10-14 NOTE — Progress Notes (Signed)
  V/Q scan is low probability for PE. I've notified Dr. Oval Linsey of results. Ok for d/c from a CV standpoint.   Lyda Jester PA-c 10/14/2018

## 2018-10-14 NOTE — Progress Notes (Signed)
CXR complete - notified MD to read STAT per radiologist request.

## 2018-10-14 NOTE — Progress Notes (Signed)
Sent Brittainy Simmon, pa note that pts vQ can is complete

## 2018-10-14 NOTE — Progress Notes (Signed)
Patient ready for discharge home with family. Pt will have [place] RN/PT. Reviewed all discharge instructions including prescriptions and f/u appointments.

## 2018-10-14 NOTE — Care Management Note (Addendum)
Case Management Note  Patient Details  Name: Jill Bryant MRN: [id number] Date of Birth: 11-03-1940  Subjective/Objective:   Pt in with near syncope. She lives at [employer].  DME: rollator, concentrator. Pt denies issues obtaining her medications. Pt uses either family or [place] for transportation.                 Action/Plan: Pt has had oxygen concentrator for over 20 years and currently only uses oxygen at night. She doesn't have a company that she is connected with anymore for the supplies. She has been buying tubing from [facility] out of pocket. Patient is interested in using [facility] for her new oxygen needs.  Pt with orders for walker and 3 in 1. CM will have [facility] DME deliver equipment to the room on day of d/c.  Pt with recommendations for [facility] services. CM provided her choice and [facility] selected. CM will update Cory with [place].  Pt interested in having new PCP. Son asking for [employer] practice MD: Dr Jerline Pain. CM was able to obtain her an appointment. Information on the AVS. CM following for further needs.   Addendum (1630): pt with orders for home oxygen. CM informed Brad with [facility] and he will deliver to the room and have set up at patients home. He is also aware of need for walker and 3 in 1.  Cory with [facility] notified of [place] orders and accepted the referral. Daughter in law to provide transport home.   Expected Discharge Date:                  Expected Discharge Plan:  [place]  In-House Referral:     Discharge planning Services  CM Consult  Post Acute Care Choice:  Durable Medical Equipment, Home Health Choice offered to:  Patient, Adult Children  DME Arranged:  3-N-1, Oxygen, Walker rolling DME Agency:  [place]:    [place]:  [place]  Status of Service:  In process, will continue to follow  If discussed at Long Length of Stay Meetings, dates discussed:    Additional Comments:  Pollie Friar,  RN 10/14/2018, 1:48 PM

## 2018-10-14 NOTE — Progress Notes (Signed)
Progress Note  Patient Name: Jill Bryant Date of Encounter: 10/14/2018  Primary Cardiologist: Pixie Casino, MD   Subjective   No complaints today. No CP. Just returned from a walk w PT. She required supplemental O2 after sats dropped. No recurrent falls since admission.   Inpatient Medications    Scheduled Meds: . aspirin EC  81 mg Oral Daily  . calcium carbonate  1,250 mg Oral BID WC  . cholecalciferol  2,000 Units Oral Daily  . diclofenac sodium  4 g Topical QID  . enoxaparin (LOVENOX) injection  40 mg Subcutaneous Q24H  . ferrous sulfate  325 mg Oral Q breakfast  . FLUoxetine  20 mg Oral Daily  . gabapentin  600 mg Oral QHS  . irbesartan  75 mg Oral Daily  . levothyroxine  100 mcg Oral Q24H  . loratadine  10 mg Oral Daily  . omega-3 acid ethyl esters  1,000 mg Oral Daily  . pantoprazole  40 mg Oral Daily  . pravastatin  20 mg Oral QHS  . umeclidinium bromide  1 puff Inhalation Daily  . vitamin B-12  1,000 mcg Oral Daily   Continuous Infusions: . cefTRIAXone (ROCEPHIN)  IV 1 g (10/13/18 1430)   PRN Meds: acetaminophen **OR** acetaminophen, albuterol, diazepam, hydrALAZINE, nitroGLYCERIN, ondansetron **OR** ondansetron (ZOFRAN) IV   Vital Signs    Vitals:   10/13/18 2354 10/14/18 0351 10/14/18 0719 10/14/18 0828  BP: (!) 159/58 (!) 146/59 (!) 168/75   Pulse: (!) 59 67 62   Resp: 18 18 18    Temp: 97.7 F (36.5 C) 98 F (36.7 C) 98.1 F (36.7 C)   TempSrc: Oral Oral Oral   SpO2: 90% 93% 94% 95%  Weight:  90.1 kg    Height:        Intake/Output Summary (Last 24 hours) at 10/14/2018 0939 Last data filed at 10/14/2018 0012 Gross per 24 hour  Intake -  Output 1 ml  Net -1 ml   Filed Weights   10/10/18 1745 10/10/18 2110 10/14/18 0351  Weight: 90.7 kg 88.4 kg 90.1 kg    Telemetry    NSR with a few missed beats < 2 sec - Personally Reviewed  ECG    Not performed today - Personally Reviewed  Physical Exam   GEN: No acute distress.   Neck:  No JVD Cardiac: RRR, no murmurs, rubs, or gallops.  Respiratory: Clear to auscultation bilaterally. GI: Soft, nontender, non-distended  MS: No edema; No deformity. Neuro:  Nonfocal  Psych: Normal affect   Labs    Chemistry Recent Labs  Lab 10/10/18 1823 10/10/18 2241 10/11/18 0347  NA 140  --  142  K 3.6  --  3.4*  CL 102  --  106  CO2 25  --  27  GLUCOSE 84  --  98  BUN 13  --  12  CREATININE 1.44* 1.49* 1.34*  CALCIUM 9.2  --  8.6*  PROT 6.5  --   --   ALBUMIN 3.2*  --   --   AST 24  --   --   ALT 12  --   --   ALKPHOS 36*  --   --   BILITOT 1.0  --   --   GFRNONAA 34* 33* 38*  GFRAA 40* 38* 44*  ANIONGAP 13  --  9     Hematology Recent Labs  Lab 10/10/18 1823 10/10/18 2241 10/11/18 0347  WBC 7.7 6.1 6.1  RBC 4.37 3.95 3.87  HGB 12.6 11.3* 11.0*  HCT 41.2 37.0 36.7  MCV 94.3 93.7 94.8  MCH 28.8 28.6 28.4  MCHC 30.6 30.5 30.0  RDW 13.9 13.8 14.0  PLT 196 180 196    Cardiac Enzymes Recent Labs  Lab 10/11/18 0347 10/11/18 1103 10/11/18 1758 10/12/18 0107  TROPONINI 0.27* 0.24* 0.25* 0.25*   No results for input(s): TROPIPOC in the last 168 hours.   BNPNo results for input(s): BNP, PROBNP in the last 168 hours.   DDimer No results for input(s): DDIMER in the last 168 hours.   Radiology    Ct Head Wo Contrast  Result Date: 10/12/2018 CLINICAL DATA:  Encephalopathy. Multiple recent falls. EXAM: CT HEAD WITHOUT CONTRAST TECHNIQUE: Contiguous axial images were obtained from the base of the skull through the vertex without intravenous contrast. COMPARISON:  CT head 10/10/2018. FINDINGS: Brain: No evidence for acute infarction, hemorrhage, mass lesion, hydrocephalus, or extra-axial fluid. Generalized atrophy. Slight hypoattenuation of white matter, consistent with small vessel disease. Vascular: Calcification of the cavernous internal carotid arteries consistent with cerebrovascular atherosclerotic disease. No signs of intracranial large vessel  occlusion. Skull: Calvarium intact. Sinuses/Orbits: No acute findings. BILATERAL cataract extraction. Other: No temporal bone inflammatory process. Scalp soft tissues unremarkable. Compared with priors, similar appearance. IMPRESSION: Atrophy and small vessel disease. No acute intracranial findings. Similar appearance compared with 10/10/2018. Electronically Signed   By: Staci Righter M.D.   On: 10/12/2018 13:00    Cardiac Studies   Echocardiogram 10/11/2018: Study Conclusions: - Left ventricle: The cavity size was normal. Wall thickness was increased in a pattern of mild LVH. Systolic function was normal. The estimated ejection fraction was in the range of 55% to 60%. Wall motion was normal; there were no regional wall motion abnormalities. Doppler parameters are consistent with abnormal left ventricular relaxation (grade 1 diastolic dysfunction). - Aortic valve: 23 mm bovine pericardial prosthesis in aortic postion. There was no significant regurgitation. Mean gradient (S): 5 mm Hg. Valve area (VTI): 2.74 cm^2. - Mitral valve: Moderately calcified annulus. There was trivial regurgitation. - Right ventricle: The cavity size was mildly dilated. - Right atrium: The atrium was at the upper limits of normal in size. Central venous pressure (est): 3 mm Hg. - Atrial septum: No defect or patent foramen ovale was identified. - Tricuspid valve: There was trivial regurgitation. - Pulmonary arteries: Systolic pressure was moderately to severely increased. PA peak pressure: 59 mm Hg (S). - Pericardium, extracardiac: There was no pericardial effusion.  _______________  Vascular Ultrasound Carotid Duplex 03/31/2018: Final Interpretation:  Right Carotid: Velocities in the right ICA are consistent with a 1-39% stenosis.         Non-hemodynamically significant plaque <50% noted in the CCA. The         RICA velocities remain within normal range and have decreased           compared to the prior exam.    Left Carotid: Velocities in the left ICA are consistent with a 1-39% stenosis.        Non-hemodynamically significant plaque noted in the CCA. The LICA        velocities remain within normal range and have decreased compared        to the prior exam.    Vertebrals: Right vertebral artery demonstrates antegrade flow. Antegrade,        aberrant flow in the left vertebral artery.  Subclavians: Bilateral subclavian arteries were stenotic. Bilateral subclavian        artery flow was  disturbed.          The right brachial artery is triphasic. The left brachial artery is        biphasic.  _______________  Vascular Ultrasound ABI 03/31/2018: Final Interpretation:  Right: Resting right ankle-brachial index is within normal range. No evidence of significant right lower extremity arterial disease. The right toe-brachial index is abnormal.      Left: Resting left ankle-brachial index is within normal range. No evidence of significant left lower extremity arterial disease. The left toe-brachial index is abnormal.  _______________  Vascular Ultrasound Aorta/IVC/Iliacs 03/31/2018: Final Interpretation:  Abdominal Aorta: The largest aortic measurement is 2.1 cm.    Widely patent iliac portion and distal anastomoses of the AFBG.    Patent IVC.     Patient Profile   Janicia Monterrosa is a 77 y.o. female with a history of non-obstructive CAD on heart catheterization in 2013, severe aortic stenosis s/p aortic valve replacement with a 10mm CE bovine pericardial aortic valve at [facility] in 2013, PAD with significant aortic occlusion s/p aortobifem bypass in 2002, hypertension, COPD, obstructive sleep apnea on CPAP and home oxygen, and CKD who is being seen today for the evaluation of recurrrent falls at the request of Dr. Jonelle Sidle.   Assessment & Plan    1. Recurrent Falls: seen in  consultation yesterday. Pt reported several episodes of falls without warning, with no preceding chest pain, shortness of breath, lightheadedness or dizziness syncope or pre-syncope. No notable findings on tele. EKG nonischemic. Troponin was elevated to 0.34 and flat. Her falls do not seem related to cardiac issues. She denies any recurrent falls since being admitted.   2. Pulmonary HTN: Echo shows normal systolic function but PASP is elevated to 59 mmHg, consistent with severe pulmonary hypertension. Level is significantly increased compared to prior echo in March when PA pressures were normal. Given her significant increase in PA pressure and elevated troponin, V/Q scan has been ordered to r/o PE (no chest CT due to renal function).   3. Elevated Troponin: Troponin minimally elevated at 0.42 >> 0.34 >> 0.27 >> 0.24 >>  0.25 >> 0.25. Not consistent with ACS. Possible demand ischemia in setting of hypertension. EKG showed no acute ischemic changes. Will get V/Q scan to rule out PE given elevated pulmonary pressure on Echo. If negative, no further cardiac workup is necessary.  4. H/o Severe AS, s/p AVR: s/p aortic valve replacement with a 67mm CE bovine pericardial aortic valve at [facility] in 2013. Echo shows mean gradient of 5 mmHg.  5. HTN: pressures have been elevated. Most recent documented BP this morning was 168/75. However it was noted earlier that pt had a 25 pt drop in pressure with orthostatic check. Needs close monitoring. Recommend repeating orthostatics today.  6. OSA: refuses CPAP.     For questions or updates, please contact [facility] Please consult [email or web address] for contact info under        Signed, Lyda Jester, PA-C  10/14/2018, 9:39 AM

## 2018-10-14 NOTE — Progress Notes (Signed)
Physical Therapy Treatment Patient Details Name: Jill Bryant MRN: [id number] DOB: [birth year] Today's Date: 10/14/2018    History of Present Illness Jill Bryant is a 77 y.o. female with history of bioprosthetic aortic valve replacement, aortofemoral bypass, COPD, sleep apnea, chronic kidney disease hypertension presents to the ER after patient has been having repeated falls over the last 3 days. Found to have elevated troponin.    PT Comments    Pt eager to mobilize to bathroom on arrival performed on RA and patient desaturated to 81% on RA.  Pt required 3L for remainder of tx to improve O2 sats greater than 91%.  Pt remains appropriate to return home with HHPT and equipment listed below.  Plan for HEP next session to prepare for return home.      Follow Up Recommendations  Home health PT;Supervision - Intermittent     Equipment Recommendations  3in1 (PT);Rolling walker with 5" wheels(portable O2 tank)    Recommendations for Other Services OT consult     Precautions / Restrictions Precautions Precautions: Fall Restrictions Weight Bearing Restrictions: No    Mobility  Bed Mobility Overal bed mobility: Modified Independent             General bed mobility comments: Increased time and effort and HOB elevated during transfer.  Plan for bed mobility with HOB flat next session.    Transfers Overall transfer level: Needs assistance Equipment used: Rolling walker (2 wheeled) Transfers: Sit to/from Stand Sit to Stand: Min guard         General transfer comment: Cues for hand placement to elevate into stnding and avoid pulling on RW for support.    Ambulation/Gait Ambulation/Gait assistance: Min guard Gait Distance (Feet): 100 Feet Assistive device: Rolling walker (2 wheeled) Gait Pattern/deviations: Step-through pattern;Decreased stride length Gait velocity: Decreased   General Gait Details: Pt remains impulsive cues to slow when turning and keep device close for  support.  Pt has a tendency to push RW too far forward.  DOE noted with gait training.     Stairs             Wheelchair Mobility    Modified Rankin (Stroke Patients Only)       Balance Overall balance assessment: Needs assistance Sitting-balance support: No upper extremity supported;Feet supported Sitting balance-Leahy Scale: Normal       Standing balance-Leahy Scale: Fair                              Cognition Arousal/Alertness: Awake/alert Behavior During Therapy: WFL for tasks assessed/performed Overall Cognitive Status: Within Functional Limits for tasks assessed                                        Exercises      General Comments        Pertinent Vitals/Pain Pain Assessment: Faces Faces Pain Scale: Hurts little more Pain Location: L knee ( bakers cyst ) Pain Descriptors / Indicators: Aching Pain Intervention(s): Monitored during session;Repositioned    Home Living                      Prior Function            PT Goals (current goals can now be found in the care plan section) Acute Rehab PT Goals Patient Stated Goal: Stop falling Potential to  Achieve Goals: Good Progress towards PT goals: Progressing toward goals    Frequency    Min 3X/week      PT Plan Current plan remains appropriate    Co-evaluation              AM-PAC PT "6 Clicks" Mobility   Outcome Measure  Help needed turning from your back to your side while in a flat bed without using bedrails?: None Help needed moving from lying on your back to sitting on the side of a flat bed without using bedrails?: None Help needed moving to and from a bed to a chair (including a wheelchair)?: None Help needed standing up from a chair using your arms (e.g., wheelchair or bedside chair)?: A Little Help needed to walk in hospital room?: A Little Help needed climbing 3-5 steps with a railing? : A Lot 6 Click Score: 20    End of Session  Equipment Utilized During Treatment: Gait belt;Oxygen Activity Tolerance: Patient tolerated treatment well Patient left: with call bell/phone within reach;with family/visitor present;in chair Nurse Communication: Mobility status PT Visit Diagnosis: Unsteadiness on feet (R26.81);Repeated falls (R29.6);History of falling (Z91.81);Difficulty in walking, not elsewhere classified (R26.2)     Time: 0630-1601 PT Time Calculation (min) (ACUTE ONLY): 20 min  Charges:  $Gait Training: 8-22 mins                     Governor Rooks, PTA Acute Rehabilitation Services Pager [phone] Office [phone]     Jill Bryant 10/14/2018, 10:38 AM

## 2018-10-14 NOTE — Discharge Summary (Signed)
Physician Discharge Summary  Jill Bryant KCL:[id number] DOB: 13-Sep-1941 DOA: 10/10/2018  PCP: Curt Jews, PA-C  Admit date: 10/10/2018 Discharge date: 10/14/2018  Admitted From: Home Disposition: Home  Recommendations for Outpatient Follow-up:  1. Follow up with PCP in 1 week 2. Follow up with pulmonologist and cardiologist 3. Please obtain BMP/CBC in one week 4. Please follow up on the following pending results: None  Home Health: PT Equipment/Devices: Walker, 3 in 1  Discharge Condition: Stable CODE STATUS: Full code Diet recommendation: Heart healthy   Brief/Interim Summary:  Admission HPI written by Rise Patience, MD   Study Conclusions  - Left ventricle: The cavity size was normal. Wall thickness was   increased in a pattern of mild LVH. Systolic function was normal.   The estimated ejection fraction was in the range of 55% to 60%.   Wall motion was normal; there were no regional wall motion   abnormalities. Doppler parameters are consistent with abnormal   left ventricular relaxation (grade 1 diastolic dysfunction). - Aortic valve: 23 mm bovine pericardial prosthesis in aortic   postion. There was no significant regurgitation. Mean gradient   (S): 5 mm Hg. Valve area (VTI): 2.74 cm^2. - Mitral valve: Moderately calcified annulus. There was trivial   regurgitation. - Right ventricle: The cavity size was mildly dilated. - Right atrium: The atrium was at the upper limits of normal in   size. Central venous pressure (est): 3 mm Hg. - Atrial septum: No defect or patent foramen ovale was identified. - Tricuspid valve: There was trivial regurgitation. - Pulmonary arteries: Systolic pressure was moderately to severely   increased. PA peak pressure: 59 mm Hg (S). - Pericardium, extracardiac: There was no pericardial effusion.    Hospital course:  Near yncope Possibly related to debilitation. History of aortic valve stenosis s/p AVR. Transthoracic  Echocardiogram performed without complications seen with AVR. EKG significant for new RBBB. Troponin has been elevated but trending down since admission. No current or prior chest pain. PT/OT recommending home health. Cardiology consulted and obtained a V/Q scan which was low risk for PE.  New RBBB Cardiology follow-up.  Transient amnesia Noted by previous provider. Urine culture obtained which showed multiple species with only 20,000 colony units  Elevated troponin No chest pain with associated new RBBB but no ST-T changes. RBBB is new. Cardiology recommendations as above. Outpatient follow-up.  CAD No chest pain. Above problems.  Essential hypertension Continued irbesartan  COPD Sable. Nebulizer as needed  OSA Patient is supposed to use CPAP at night but does not. Has evidence of elevated PA pressures on Transthoracic Echocardiogram. Patient follows with pulmonology as an outpatient. Encouraged to use CPAP at night.  Obesity Body mass index is 30.52 kg/m.  Abnormal x-ray Possibly related to positioning vs dissection. Clinically does not correlate. Outpatient follow-up imaging.  Knee pain Likely osteoarthritis. Voltaren gel.  Discharge Diagnoses:  Principal Problem:   Near syncope Active Problems:   S/P aorto-bifemoral bypass surgery   S/P AVR (aortic valve replacement)   CAD (coronary artery disease)   PVC's (premature ventricular contractions)   COPD, severity to be determined (HCC)   OSA on CPAP   Thoracic aortic aneurysm without rupture (HCC)   Elevated troponin   Acute head injury   Hypokalemia   HTN (hypertension)   NSTEMI (non-ST elevated myocardial infarction) ([place])   Pulmonary hypertension, unspecified [facility])    Discharge Instructions  Discharge Instructions    Call MD for:  persistant dizziness or light-headedness  Complete by:  As directed    Diet - low sodium heart healthy   Complete by:  As directed    Increase activity slowly    Complete by:  As directed      Allergies as of 10/14/2018      Reactions   Tape Rash, Other (See Comments)   SKIN IS VERY THIN; TAPE TEARS THE SKIN!!   Amlodipine Swelling, Other (See Comments)   Feet/toes swelling   Metoprolol Other (See Comments)   Bradycardia and fatigue   Morphine And Related Other (See Comments)   Hallucinations/seizures      Medication List    STOP taking these medications   levofloxacin 500 MG tablet Commonly known as:  LEVAQUIN     TAKE these medications   acetaminophen 500 MG tablet Commonly known as:  TYLENOL Take 1,000 mg by mouth every 6 (six) hours as needed (for pain or headaches).   albuterol 108 (90 Base) MCG/ACT inhaler Commonly known as:  PROVENTIL HFA;VENTOLIN HFA Inhale 2 puffs into the lungs every 6 (six) hours as needed for wheezing or shortness of breath.   albuterol 0.63 MG/3ML nebulizer solution Commonly known as:  ACCUNEB Take 1 ampule by nebulization 2 (two) times daily as needed for wheezing or shortness of breath (or congestion).   aspirin EC 81 MG tablet Take 81 mg by mouth daily.   CALCIUM 600 600 MG Tabs tablet Generic drug:  calcium carbonate Take 600 mg by mouth 2 (two) times daily with a meal.   cetirizine 10 MG tablet Commonly known as:  ZYRTEC Take 10 mg by mouth daily.   conjugated estrogens vaginal cream Commonly known as:  PREMARIN Place 1 Applicatorful vaginally See admin instructions. Insert a pea-sized amount of cream into the vagina 2-3 times a week   diazepam 5 MG tablet Commonly known as:  VALIUM Take 5 mg by mouth 2 (two) times daily as needed for anxiety.   diclofenac sodium 1 % Gel Commonly known as:  VOLTAREN Apply 4 g topically 4 (four) times daily.   ferrous sulfate 325 (65 FE) MG EC tablet Take 325 mg by mouth daily with breakfast.   FLUoxetine 20 MG capsule Commonly known as:  PROZAC Take 20 mg by mouth daily.   gabapentin 300 MG capsule Commonly known as:  NEURONTIN Take 600 mg  by mouth at bedtime.   INCRUSE ELLIPTA 62.5 MCG/INH Aepb Generic drug:  umeclidinium bromide Inhale 1 puff into the lungs daily.   levothyroxine 100 MCG tablet Commonly known as:  SYNTHROID, LEVOTHROID Take 100 mcg by mouth daily before breakfast.   nitroGLYCERIN 0.4 MG SL tablet Commonly known as:  NITROSTAT Place 1 tablet (0.4 mg total) under the tongue every 5 (five) minutes. What changed:    when to take this  reasons to take this   Omega-3 1000 MG Caps Take 1,000 mg by mouth daily.   omeprazole 20 MG capsule Commonly known as:  PRILOSEC Take 20 mg by mouth daily before breakfast.   OXYGEN Inhale 2-4 L into the lungs at bedtime.   pravastatin 20 MG tablet Commonly known as:  PRAVACHOL Take 20 mg by mouth at bedtime.   telmisartan 20 MG tablet Commonly known as:  MICARDIS Take 20 mg by mouth daily.   vitamin B-12 1000 MCG tablet Commonly known as:  CYANOCOBALAMIN Take 1,000 mcg by mouth daily.   Vitamin D-3 25 MCG (1000 UT) Caps Take 2,000 Units by mouth daily.  Durable Medical Equipment  (From admission, onward)         Start     Ordered   10/14/18 1554  For home use only DME oxygen  Once    Question Answer Comment  Mode or (Route) Nasal cannula   Liters per Minute 3   Frequency Continuous (stationary and portable oxygen unit needed)   Oxygen delivery system Gas      10/14/18 1553   10/14/18 1346  For home use only DME 3 n 1  Once     10/14/18 1345   10/14/18 1346  For home use only DME Walker rolling  Once    Question:  Patient needs a walker to treat with the following condition  Answer:  Syncope   10/14/18 1345         Follow-up Information    [employer], PA-C. Schedule an appointment as soon as possible for a visit in 1 week(s).   Specialty:  Physician Assistant Contact information: [address] [place] [address] [phone]        Pixie Casino, MD .   Specialty:  Cardiology Contact  information: [address] [place] [address] [phone]          Allergies  Allergen Reactions  . Tape Rash and Other (See Comments)    SKIN IS VERY THIN; TAPE TEARS THE SKIN!!  . Amlodipine Swelling and Other (See Comments)    Feet/toes swelling  . Metoprolol Other (See Comments)    Bradycardia and fatigue  . Morphine And Related Other (See Comments)    Hallucinations/seizures    Consultations:  Cardiology   Procedures/Studies: Dg Chest 2 View  Result Date: 10/14/2018 CLINICAL DATA:  Chest pain. History of pulmonary nodule. EXAM: CHEST - 2 VIEW COMPARISON:  10/11/2018 and multiple previous FINDINGS: Previous median sternotomy and aortic valve replacement. Chronic aortic atherosclerosis. Change in the contour of the descending thoracic aorta that I can not completely attribute to positioning. Therefore, the possibility of aortic dissection does exist based on this radiograph. There is chronic interstitial lung disease with scarring. Atelectasis is slightly more pronounced at the right base. No visible effusion. Focal consolidation in the posterior aspect of the right lower lobe as seen previously. IMPRESSION: 1. Change in the contour of the descending thoracic aorta that I can not completely attribute to positioning. Therefore, the possibility of aortic dissection should be considered. 2. Chronic interstitial lung disease with scarring. 3. Focal consolidation in the posterior aspect of the right lower lobe, similar to previous studies. 4. These results will be called to the ordering clinician or representative by the Radiologist Assistant, and communication documented in the PACS or zVision Dashboard. Electronically Signed   By: Nelson Chimes M.D.   On: 10/14/2018 11:29   Ct Head Wo Contrast  Result Date: 10/12/2018 CLINICAL DATA:  Encephalopathy. Multiple recent falls. EXAM: CT HEAD WITHOUT CONTRAST TECHNIQUE: Contiguous axial images were obtained from the base  of the skull through the vertex without intravenous contrast. COMPARISON:  CT head 10/10/2018. FINDINGS: Brain: No evidence for acute infarction, hemorrhage, mass lesion, hydrocephalus, or extra-axial fluid. Generalized atrophy. Slight hypoattenuation of white matter, consistent with small vessel disease. Vascular: Calcification of the cavernous internal carotid arteries consistent with cerebrovascular atherosclerotic disease. No signs of intracranial large vessel occlusion. Skull: Calvarium intact. Sinuses/Orbits: No acute findings. BILATERAL cataract extraction. Other: No temporal bone inflammatory process. Scalp soft tissues unremarkable. Compared with priors, similar appearance. IMPRESSION: Atrophy and  small vessel disease. No acute intracranial findings. Similar appearance compared with 10/10/2018. Electronically Signed   By: Staci Righter M.D.   On: 10/12/2018 13:00   Ct Head Wo Contrast  Result Date: 10/10/2018 CLINICAL DATA:  Multiple recent falls EXAM: CT HEAD WITHOUT CONTRAST TECHNIQUE: Contiguous axial images were obtained from the base of the skull through the vertex without intravenous contrast. COMPARISON:  Head CT 05/12/2013 FINDINGS: Brain: There is no mass, hemorrhage or extra-axial collection. There is generalized atrophy without lobar predilection. There is hypoattenuation of the periventricular white matter, most commonly indicating chronic ischemic microangiopathy. Vascular: No abnormal hyperdensity of the major intracranial arteries or dural venous sinuses. No intracranial atherosclerosis. Skull: The visualized skull base, calvarium and extracranial soft tissues are normal. Sinuses/Orbits: No fluid levels or advanced mucosal thickening of the visualized paranasal sinuses. No mastoid or middle ear effusion. The orbits are normal. IMPRESSION: 1. No acute intracranial abnormality. 2. Atrophy and chronic ischemic microangiopathy. Electronically Signed   By: Ulyses Jarred M.D.   On: 10/10/2018  19:08   Ct Thoracic Spine Wo Contrast  Result Date: 10/10/2018 CLINICAL DATA:  Multiple recent falls. Upper back pain. EXAM: CT THORACIC SPINE WITHOUT CONTRAST TECHNIQUE: Multidetector CT images of the thoracic were obtained using the standard protocol without intravenous contrast. COMPARISON:  None. FINDINGS: Alignment: Normal. Vertebrae: There is no acute fracture. Mild multilevel central vertebral body height loss, degenerative. No aggressive marrow lesion. Paraspinal and other soft tissues: Focal area of consolidation in the right lower lobe, unchanged compared to 09/08/2018. there is calcific aortic atherosclerosis. Disc levels: No large disc herniation. No spinal canal stenosis. No thoracic neural foraminal stenosis. IMPRESSION: 1. No acute fracture or static subluxation of the thoracic spine. 2. Unchanged consolidation within the right lower lobe, incompletely visualized. 3.  Aortic atherosclerosis (ICD10-I70.0). Electronically Signed   By: Ulyses Jarred M.D.   On: 10/10/2018 19:04   Nm Pulmonary Perf And Vent  Result Date: 10/14/2018 CLINICAL DATA:  Elevated pulmonary arterial pressures EXAM: NUCLEAR MEDICINE VENTILATION - PERFUSION LUNG SCAN TECHNIQUE: Ventilation images were obtained in multiple projections using inhaled aerosol Tc-36m DTPA. Perfusion images were obtained in multiple projections after intravenous injection of Tc-63m MAA. RADIOPHARMACEUTICALS:  30.0 mCi of Tc-88m DTPA aerosol inhalation and 4.0 mCi Tc3m MAA IV COMPARISON:  None. FINDINGS: Ventilation: Multiple matched defects, most prominent in the right upper lobe. Perfusion: Multiple matched defects most prominent in the right upper lobe. IMPRESSION: Very low probability V/Q scan. Electronically Signed   By: Dorise Bullion III M.D   On: 10/14/2018 12:42   Dg Chest Port 1 View  Result Date: 10/11/2018 CLINICAL DATA:  Shortness of breath. EXAM: PORTABLE CHEST 1 VIEW COMPARISON:  Radiograph of September 08, 2018. FINDINGS:  Stable cardiomegaly. Status post aortic valve replacement. Atherosclerosis of thoracic aorta is noted. No pneumothorax is noted. Left lung is clear. No significant pleural effusion is noted. Stable right basilar atelectasis, pneumonia or scarring is noted. Bony thorax is unremarkable. IMPRESSION: Stable right basilar opacity as described above. No new abnormality is noted. Aortic Atherosclerosis (ICD10-I70.0). Electronically Signed   By: Marijo Conception, M.D.   On: 10/11/2018 09:28     Study Conclusions  - Left ventricle: The cavity size was normal. Wall thickness was   increased in a pattern of mild LVH. Systolic function was normal.   The estimated ejection fraction was in the range of 55% to 60%.   Wall motion was normal; there were no regional wall motion   abnormalities.  Doppler parameters are consistent with abnormal   left ventricular relaxation (grade 1 diastolic dysfunction). - Aortic valve: 23 mm bovine pericardial prosthesis in aortic   postion. There was no significant regurgitation. Mean gradient   (S): 5 mm Hg. Valve area (VTI): 2.74 cm^2. - Mitral valve: Moderately calcified annulus. There was trivial   regurgitation. - Right ventricle: The cavity size was mildly dilated. - Right atrium: The atrium was at the upper limits of normal in   size. Central venous pressure (est): 3 mm Hg. - Atrial septum: No defect or patent foramen ovale was identified. - Tricuspid valve: There was trivial regurgitation. - Pulmonary arteries: Systolic pressure was moderately to severely   increased. PA peak pressure: 59 mm Hg (S). - Pericardium, extracardiac: There was no pericardial effusion   Subjective: No chest pain, dyspnea.  Discharge Exam: Vitals:   10/14/18 0828 10/14/18 1525  BP:  (!) 156/68  Pulse:  68  Resp:  18  Temp:  97.7 F (36.5 C)  SpO2: 95% 97%   Vitals:   10/14/18 0351 10/14/18 0719 10/14/18 0828 10/14/18 1525  BP: (!) 146/59 (!) 168/75  (!) 156/68  Pulse: 67 62   68  Resp: 18 18  18   Temp: 98 F (36.7 C) 98.1 F (36.7 C)  97.7 F (36.5 C)  TempSrc: Oral Oral  Oral  SpO2: 93% 94% 95% 97%  Weight: 90.1 kg     Height:        General: Pt is alert, awake, not in acute distress Cardiovascular: RRR, S1/S2 +, no rubs, no gallops Respiratory: CTA bilaterally, no wheezing, no rhonchi Abdominal: Soft, NT, ND, bowel sounds + Extremities: no edema, no cyanosis. Right knee joint line pain    The results of significant diagnostics from this hospitalization (including imaging, microbiology, ancillary and laboratory) are listed below for reference.     Microbiology: Recent Results (from the past 240 hour(s))  Culture, Urine     Status: Abnormal   Collection Time: 10/12/18 10:17 AM  Result Value Ref Range Status   Specimen Description URINE, CLEAN CATCH  Final   Special Requests   Final    NONE Performed at [facility], [address], [place], [place] [address]    Culture (A)  Final    20,000 COLONIES/mL MULTIPLE SPECIES PRESENT, SUGGEST RECOLLECTION   Report Status 10/13/2018 FINAL  Final     Labs: BNP (last 3 results) Recent Labs    05/04/18 1632  BNP 38.7   Basic Metabolic Panel: Recent Labs  Lab 10/10/18 1823 10/10/18 2241 10/11/18 0347  NA 140  --  142  K 3.6  --  3.4*  CL 102  --  106  CO2 25  --  27  GLUCOSE 84  --  98  BUN 13  --  12  CREATININE 1.44* 1.49* 1.34*  CALCIUM 9.2  --  8.6*   Liver Function Tests: Recent Labs  Lab 10/10/18 1823  AST 24  ALT 12  ALKPHOS 36*  BILITOT 1.0  PROT 6.5  ALBUMIN 3.2*   No results for input(s): LIPASE, AMYLASE in the last 168 hours. No results for input(s): AMMONIA in the last 168 hours. CBC: Recent Labs  Lab 10/10/18 1823 10/10/18 2241 10/11/18 0347  WBC 7.7 6.1 6.1  HGB 12.6 11.3* 11.0*  HCT 41.2 37.0 36.7  MCV 94.3 93.7 94.8  PLT 196 180 196   Cardiac Enzymes: Recent Labs  Lab 10/10/18 2241 10/11/18 0347 10/11/18 1103 10/11/18  1758 10/12/18 0107    TROPONINI 0.34* 0.27* 0.24* 0.25* 0.25*   BNP: Invalid input(s): POCBNP CBG: Recent Labs  Lab 10/10/18 1920  GLUCAP 74   D-Dimer No results for input(s): DDIMER in the last 72 hours. Hgb A1c No results for input(s): HGBA1C in the last 72 hours. Lipid Profile No results for input(s): CHOL, HDL, LDLCALC, TRIG, CHOLHDL, LDLDIRECT in the last 72 hours. Thyroid function studies No results for input(s): TSH, T4TOTAL, T3FREE, THYROIDAB in the last 72 hours.  Invalid input(s): FREET3 Anemia work up No results for input(s): VITAMINB12, FOLATE, FERRITIN, TIBC, IRON, RETICCTPCT in the last 72 hours. Urinalysis    Component Value Date/Time   COLORURINE YELLOW 10/12/2018 [place] 10/12/2018 1029   LABSPEC 1.005 10/12/2018 1029   PHURINE 6.0 10/12/2018 1029   GLUCOSEU NEGATIVE 10/12/2018 1029   HGBUR NEGATIVE 10/12/2018 [place] 10/12/2018 1029   KETONESUR NEGATIVE 10/12/2018 1029   PROTEINUR NEGATIVE 10/12/2018 1029   UROBILINOGEN 1.0 08/17/2015 1206   NITRITE NEGATIVE 10/12/2018 1029   LEUKOCYTESUR TRACE (A) 10/12/2018 1029   Sepsis Labs Invalid input(s): PROCALCITONIN,  WBC,  LACTICIDVEN Microbiology Recent Results (from the past 240 hour(s))  Culture, Urine     Status: Abnormal   Collection Time: 10/12/18 10:17 AM  Result Value Ref Range Status   Specimen Description URINE, CLEAN CATCH  Final   Special Requests   Final    NONE Performed at [facility], [place] [address] [place], [place] [address]    Culture (A)  Final    20,000 COLONIES/mL MULTIPLE SPECIES PRESENT, SUGGEST RECOLLECTION   Report Status 10/13/2018 FINAL  Final     SIGNED:   Cordelia Poche, MD [facility] 10/14/2018, 4:59 PM

## 2018-10-14 NOTE — Plan of Care (Signed)
  Problem: Education: Goal: Knowledge of General Education information will improve Description Including pain rating scale, medication(s)/side effects and non-pharmacologic comfort measures Outcome: Adequate for Discharge   Problem: Health Behavior/Discharge Planning: Goal: Ability to manage health-related needs will improve Outcome: Adequate for Discharge   Problem: Clinical Measurements: Goal: Ability to maintain clinical measurements within normal limits will improve Outcome: Adequate for Discharge Goal: Will remain free from infection Outcome: Adequate for Discharge Goal: Diagnostic test results will improve Outcome: Adequate for Discharge Goal: Respiratory complications will improve Outcome: Adequate for Discharge Goal: Cardiovascular complication will be avoided Outcome: Adequate for Discharge   Problem: Activity: Goal: Risk for activity intolerance will decrease Outcome: Adequate for Discharge   Problem: Nutrition: Goal: Adequate nutrition will be maintained Outcome: Adequate for Discharge   Problem: Coping: Goal: Level of anxiety will decrease Outcome: Adequate for Discharge   Problem: Elimination: Goal: Will not experience complications related to bowel motility Outcome: Adequate for Discharge Goal: Will not experience complications related to urinary retention Outcome: Adequate for Discharge   Problem: Pain Managment: Goal: General experience of comfort will improve Outcome: Adequate for Discharge   Problem: Safety: Goal: Ability to remain free from injury will improve Outcome: Adequate for Discharge   Problem: Skin Integrity: Goal: Risk for impaired skin integrity will decrease Outcome: Adequate for Discharge   Problem: Education: Goal: Knowledge of disease or condition will improve Outcome: Adequate for Discharge Goal: Knowledge of secondary prevention will improve Outcome: Adequate for Discharge

## 2018-10-14 NOTE — Discharge Instructions (Signed)
Jill Bryant,  You were in the hospital because of almost passing out. This was worked up and there was no reason identified. You have some high pressure in your lungs and right side of your heart which may be because of your sleep apnea. Please follow-up with your pulmonologist and cardiologist.

## 2018-10-14 NOTE — Care Management Obs Status (Signed)
[facility] NOTIFICATION   Patient Details  Name: Jill Bryant MRN: [id number] Date of Birth: [birth year]   Medicare Observation Status Notification Given:  Yes    Pollie Friar, RN 10/14/2018, 2:11 PM

## 2018-10-14 NOTE — Progress Notes (Signed)
SATURATION QUALIFICATIONS: (This note is used to comply with regulatory documentation for home oxygen)  Patient Saturations on Room Air at Rest = 91%  Patient Saturations on Room Air while Ambulating = 81%  Patient Saturations on 3 Liters of oxygen while Ambulating = 93%  Please briefly explain why patient needs home oxygen: desaturation with activity.  Governor Rooks, PTA Acute Rehabilitation Services Pager [phone] Office [phone]

## 2018-10-31 ENCOUNTER — Ambulatory Visit (INDEPENDENT_AMBULATORY_CARE_PROVIDER_SITE_OTHER): Payer: Medicare Other | Admitting: Family Medicine

## 2018-10-31 ENCOUNTER — Encounter: Payer: Self-pay | Admitting: Family Medicine

## 2018-10-31 VITALS — BP 122/86 | HR 73 | Temp 97.9°F | Ht 67.0 in | Wt 200.8 lb

## 2018-10-31 DIAGNOSIS — G4733 Obstructive sleep apnea (adult) (pediatric): Secondary | ICD-10-CM

## 2018-10-31 DIAGNOSIS — R5381 Other malaise: Secondary | ICD-10-CM | POA: Diagnosis not present

## 2018-10-31 DIAGNOSIS — I10 Essential (primary) hypertension: Secondary | ICD-10-CM

## 2018-10-31 DIAGNOSIS — E039 Hypothyroidism, unspecified: Secondary | ICD-10-CM | POA: Insufficient documentation

## 2018-10-31 DIAGNOSIS — F325 Major depressive disorder, single episode, in full remission: Secondary | ICD-10-CM

## 2018-10-31 DIAGNOSIS — I739 Peripheral vascular disease, unspecified: Secondary | ICD-10-CM

## 2018-10-31 DIAGNOSIS — Z9989 Dependence on other enabling machines and devices: Secondary | ICD-10-CM

## 2018-10-31 DIAGNOSIS — K219 Gastro-esophageal reflux disease without esophagitis: Secondary | ICD-10-CM

## 2018-10-31 DIAGNOSIS — J449 Chronic obstructive pulmonary disease, unspecified: Secondary | ICD-10-CM

## 2018-10-31 DIAGNOSIS — I2510 Atherosclerotic heart disease of native coronary artery without angina pectoris: Secondary | ICD-10-CM

## 2018-10-31 DIAGNOSIS — I779 Disorder of arteries and arterioles, unspecified: Secondary | ICD-10-CM

## 2018-10-31 DIAGNOSIS — N183 Chronic kidney disease, stage 3 unspecified: Secondary | ICD-10-CM

## 2018-10-31 DIAGNOSIS — G629 Polyneuropathy, unspecified: Secondary | ICD-10-CM | POA: Insufficient documentation

## 2018-10-31 DIAGNOSIS — G6289 Other specified polyneuropathies: Secondary | ICD-10-CM

## 2018-10-31 DIAGNOSIS — R911 Solitary pulmonary nodule: Secondary | ICD-10-CM | POA: Insufficient documentation

## 2018-10-31 NOTE — Assessment & Plan Note (Signed)
At goal today.  Continue telmisartan 20 mg daily.

## 2018-10-31 NOTE — Assessment & Plan Note (Signed)
Follows with pulmonology.  Stable on home oxygen.  Continue Incruse Ellipta and albuterol as needed.

## 2018-10-31 NOTE — Assessment & Plan Note (Signed)
Continue home health.

## 2018-10-31 NOTE — Progress Notes (Signed)
Subjective:  Jill Bryant is a 78 y.o. female who presents today with a chief complaint of frequent falls and to establish care  HPI:  Frequent falls Worsened within the last month.  Was hospitalized a couple of weeks ago.  No clear etiology was found however she was told that it was most likely due to combination of urinary tract infection and her COPD/OSA.  She has been working with home health PT for the past couple of weeks since being discharged and has not had any recurrence of her falls.  No weakness or numbness.  No head trauma.  No headache.  No vision changes.  She has several other stable, chronic problems outlined below  # CAD / PAD s/p aortobifemoral bypass - Follows with cardiology -On aspirin 81 mg daily and pravastatin 2 mg daily.  Tolerating both well.  # COPD / OSA on CPAP / [place]ia -Follows with pulmonology -Currently on Incruse Ellipta.  He uses albuterol as needed.  She is on daily continuous oxygen at 3 L.  Symptoms are stable.  #CKD - Follows with nephrology  # Depression / Anxiety -Symptoms are well controlled on Prozac 20 mg daily and Valium 5 mg twice daily as needed.  # Hypothyroidism -Currently on Synthroid 100 mcg daily.  Tolerating well.  # Neuropathy -On gabapentin 600 mg at bedtime.  # Recurrent UTI - Has followed with urology in the past.  No current symptoms.  # Lung Nodule -Follows with oncology.  Has had biopsy attempts in the past which were unsuccessful.   ROS: Per HPI, otherwise a complete review of systems was negative.   PMH:  The following were reviewed and entered/updated in epic: Past Medical History:  Diagnosis Date  . Anemia   . Anxiety   . Arthritis   . Asthma   . Back pain, chronic   . CHF (congestive heart failure) ([place])   . COPD (chronic obstructive pulmonary disease) ([place])   . Depression   . GERD (gastroesophageal reflux disease)   . Headache    migraines  . Hyperlipidemia   . Hypertension   . Hypothyroidism    . 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 Lumbar stenosis   . Pneumonia   . PONV (postoperative nausea and vomiting)   . Retinal tear    right eye  . Seizures ([place])    with morphine  . Shortness of breath dyspnea    with exertion  . Sleep apnea    wears CPAP  . TIA (transient ischemic attack)   . UTI (lower urinary tract infection)   . Wears glasses    Patient Active Problem List   Diagnosis Date Noted  . Peripheral arterial disease ([place]) s/p aortobifemoral bypass 10/31/2018  . CKD (chronic kidney disease) stage 3, GFR 30-59 ml/min (HCC) 10/31/2018  . Major depression with anxiety ([place]) 10/31/2018  . Hypothyroidism 10/31/2018  . Peripheral neuropathy 10/31/2018  . Debility 10/31/2018  . Lung nodule 10/31/2018  . Pulmonary hypertension, unspecified ([place])   . HTN (hypertension) 10/11/2018  . Thoracic aortic aneurysm without rupture ([place]) 02/03/2018  . GERD (gastroesophageal reflux disease) 04/27/2015  . S/P AVR (aortic valve replacement) 02/23/2015  . CAD (coronary artery disease) 02/23/2015  . Carotid artery disease ([place]) 02/23/2015  . COPD, severity to be determined ([place]) 02/23/2015  . OSA on CPAP 02/23/2015   Past Surgical History:  Procedure Laterality Date  . aortic endarterectomy    . aortobifemoral bypass    .  BREAST SURGERY     cyst aspiration right breast  . CARDIAC SURGERY     valve replacement 2013; aorta replacement 2002  . CATARACT EXTRACTION W/ INTRAOCULAR LENS  IMPLANT, BILATERAL    . CHOLECYSTECTOMY    . COLONOSCOPY    . HERNIA REPAIR    . LUMBAR EPIDURAL INJECTION    . LUMBAR LAMINECTOMY/DECOMPRESSION MICRODISCECTOMY Left 02/27/2016   Procedure: Laminectomy and Foraminotomy - Lumbar four-five -Lumbar three-four - left diskectomy Lumbar three-four;  Surgeon: Kary Kos, MD;  Location: [facility];  Service: Neurosurgery;  Laterality: Left;  Marland Kitchen MULTIPLE TOOTH EXTRACTIONS    . TUBAL LIGATION      Family History  Problem Relation Age of Onset  .  Other Mother   . Alzheimer's disease Father   . Heart disease Maternal Grandmother   . Heart disease Maternal Aunt     Medications- reviewed and updated Current Outpatient Medications  Medication Sig Dispense Refill  . acetaminophen (TYLENOL) 500 MG tablet Take 1,000 mg by mouth every 6 (six) hours as needed (for pain or headaches).     Marland Kitchen albuterol (ACCUNEB) 0.63 MG/3ML nebulizer solution Take 1 ampule by nebulization 2 (two) times daily as needed for wheezing or shortness of breath (or congestion).     Marland Kitchen albuterol (PROVENTIL HFA;VENTOLIN HFA) 108 (90 BASE) MCG/ACT inhaler Inhale 2 puffs into the lungs every 6 (six) hours as needed for wheezing or shortness of breath.     Marland Kitchen aspirin EC 81 MG tablet Take 81 mg by mouth daily.    . calcium carbonate (CALCIUM 600) 600 MG TABS tablet Take 600 mg by mouth 2 (two) times daily with a meal.    . cetirizine (ZYRTEC) 10 MG tablet Take 10 mg by mouth daily.     . Cholecalciferol (VITAMIN D-3) 25 MCG (1000 UT) CAPS Take 2,000 Units by mouth daily.    Marland Kitchen conjugated estrogens (PREMARIN) vaginal cream Place 1 Applicatorful vaginally See admin instructions. Insert a pea-sized amount of cream into the vagina 2-3 times a week    . diazepam (VALIUM) 5 MG tablet Take 5 mg by mouth 2 (two) times daily as needed for anxiety.     . diclofenac sodium (VOLTAREN) 1 % GEL Apply 4 g topically 4 (four) times daily. 1 Tube 0  . ferrous sulfate 325 (65 FE) MG EC tablet Take 325 mg by mouth daily with breakfast.   5  . FLUoxetine (PROZAC) 20 MG capsule Take 20 mg by mouth daily.     Marland Kitchen gabapentin (NEURONTIN) 300 MG capsule Take 600 mg by mouth at bedtime.     Marland Kitchen levothyroxine (SYNTHROID, LEVOTHROID) 100 MCG tablet Take 100 mcg by mouth daily before breakfast.     . nitroGLYCERIN (NITROSTAT) 0.4 MG SL tablet Place 1 tablet (0.4 mg total) under the tongue every 5 (five) minutes. (Patient taking differently: Place 0.4 mg under the tongue every 5 (five) minutes as needed for chest  pain. ) 25 tablet 3  . Omega-3 1000 MG CAPS Take 1,000 mg by mouth daily.     Marland Kitchen omeprazole (PRILOSEC) 20 MG capsule Take 20 mg by mouth daily before breakfast.     . OXYGEN Inhale 2-4 L into the lungs at bedtime.    . pravastatin (PRAVACHOL) 20 MG tablet Take 20 mg by mouth at bedtime.    Marland Kitchen telmisartan (MICARDIS) 20 MG tablet Take 20 mg by mouth daily.    Marland Kitchen umeclidinium bromide (INCRUSE ELLIPTA) 62.5 MCG/INH AEPB Inhale 1 puff  into the lungs daily.    . vitamin B-12 (CYANOCOBALAMIN) 1000 MCG tablet Take 1,000 mcg by mouth daily.     No current facility-administered medications for this visit.     Allergies-reviewed and updated Allergies  Allergen Reactions  . Tape Rash and Other (See Comments)    SKIN IS VERY THIN; TAPE TEARS THE SKIN!!  . Amlodipine Swelling and Other (See Comments)    Feet/toes swelling  . Metoprolol Other (See Comments)    Bradycardia and fatigue  . Morphine And Related Other (See Comments)    Hallucinations/seizures    Social History   Socioeconomic History  . Marital status: Widowed    Spouse name: Not on file  . Number of children: Not on file  . Years of education: Not on file  . Highest education level: Not on file  Occupational History  . Not on file  Social Needs  . Financial resource strain: Not on file  . Food insecurity:    Worry: Not on file    Inability: Not on file  . Transportation needs:    Medical: Not on file    Non-medical: Not on file  Tobacco Use  . Smoking status: Former Smoker    Last attempt to quit: 10/30/2003    Years since quitting: 15.0  . Smokeless tobacco: Never Used  Substance and Sexual Activity  . Alcohol use: No  . Drug use: No  . Sexual activity: Not on file  Lifestyle  . Physical activity:    Days per week: Not on file    Minutes per session: Not on file  . Stress: Not on file  Relationships  . Social connections:    Talks on phone: Not on file    Gets together: Not on file    Attends religious service:  Not on file    Active member of club or organization: Not on file    Attends meetings of clubs or organizations: Not on file    Relationship status: Not on file  Other Topics Concern  . Not on file  Social History Narrative  . Not on file     Objective:  Physical Exam: BP 122/86 (BP Location: Left Arm, Patient Position: Sitting, Cuff Size: Normal)   Pulse 73   Temp 97.9 F (36.6 C) (Oral)   Ht 5\' 7"  (1.702 m)   Wt 200 lb 12 oz (91.1 kg)   SpO2 94%   BMI 31.44 kg/m   Gen: NAD, resting comfortably in chair, nasal cannula in place CV: RRR with no murmurs appreciated Pulm: NWOB, CTAB with no crackles, wheezes, or rhonchi GI: Normal bowel sounds present. Soft, Nontender, Nondistended. MSK: No edema, cyanosis, or clubbing noted Skin: Warm, dry Neuro: Grossly normal, moves all extremities Psych: Normal affect and thought content  Assessment/Plan:  Peripheral arterial disease (HCC) s/p aortobifemoral bypass No signs of claudication.  Continue aspirin and statin.  Would consider increasing intensity of statin as tolerated.  Peripheral neuropathy Continue gabapentin 600 mg nightly.  Obtain records from previous PCP.  OSA on CPAP Encouraged CPAP use.    Major depression with anxiety (HCC) Continue Prozac 20 mg daily and Valium 5 mg twice daily as needed.  Lung nodule Concern for malignancy.  Following with oncology.  Hypothyroidism Continue Synthroid 100 mcg daily.  Obtain records from previous PCP.  Update TSH as needed.  GERD (gastroesophageal reflux disease) Stable.  On omeprazole 20 mg daily.  HTN (hypertension) At goal today.  Continue telmisartan 20 mg daily.  Debility Continue home health.  CAD (coronary artery disease) Per cardiology.  Continue pravastatin 20 mg daily and aspirin 81 mg daily.  COPD, severity to be determined [facility]) Follows with pulmonology.  Stable on home oxygen.  Continue Incruse Ellipta and albuterol as needed.  CKD (chronic kidney  disease) stage 3, GFR 30-59 ml/min [facility]) Per nephrology.  Carotid artery disease (HCC) Continue pravastatin 20 mg daily and aspirin 81 mg daily.  She will follow-up with me in 3 to 6 months.  Time Spent: I spent >60 minutes face-to-face with the patient, with more than half spent on coordinating care and counseling for management plan for her peripheral artery disease, peripheral neuropathy, OSA on CPAP, depression with anxiety, lung nodule, hypothyroidism, GERD, hypertension, debility, CAD, COPD, CKD, and carotid artery disease.Algis Greenhouse. Jerline Pain, MD 10/31/2018 5:20 PM

## 2018-10-31 NOTE — Assessment & Plan Note (Addendum)
Concern for malignancy.  Following with oncology.

## 2018-10-31 NOTE — Assessment & Plan Note (Signed)
Continue gabapentin 600 mg nightly.  Obtain records from previous PCP.

## 2018-10-31 NOTE — Assessment & Plan Note (Signed)
Continue pravastatin 20 mg daily and aspirin 81 mg daily.

## 2018-10-31 NOTE — Assessment & Plan Note (Signed)
Stable.  On omeprazole 20 mg daily.

## 2018-10-31 NOTE — Patient Instructions (Signed)
It was very nice to see you today!  No changes today.  We may increase your cholesterol med in the future.  Please keep your follow up appointments with your lung doctor and heart doctor.  Ask your physical therapist about exercises for vertigo.  Come back to see me in 3-6 months, or sooner as needed.   Take care, Dr Jerline Pain

## 2018-10-31 NOTE — Assessment & Plan Note (Signed)
No signs of claudication.  Continue aspirin and statin.  Would consider increasing intensity of statin as tolerated.

## 2018-10-31 NOTE — Assessment & Plan Note (Signed)
Continue Prozac 20 mg daily and Valium 5 mg twice daily as needed.

## 2018-10-31 NOTE — Assessment & Plan Note (Signed)
Per cardiology.  Continue pravastatin 20 mg daily and aspirin 81 mg daily.

## 2018-10-31 NOTE — Assessment & Plan Note (Signed)
Per nephrology 

## 2018-10-31 NOTE — Assessment & Plan Note (Signed)
Continue Synthroid 100 mcg daily.  Obtain records from previous PCP.  Update TSH as needed.

## 2018-10-31 NOTE — Assessment & Plan Note (Signed)
Encouraged CPAP use.

## 2019-02-02 ENCOUNTER — Ambulatory Visit (INDEPENDENT_AMBULATORY_CARE_PROVIDER_SITE_OTHER): Payer: Medicare Other | Admitting: Family Medicine

## 2019-02-02 ENCOUNTER — Encounter: Payer: Self-pay | Admitting: Family Medicine

## 2019-02-02 VITALS — Temp 97.1°F | Ht 67.0 in | Wt 183.0 lb

## 2019-02-02 DIAGNOSIS — E039 Hypothyroidism, unspecified: Secondary | ICD-10-CM

## 2019-02-02 DIAGNOSIS — J449 Chronic obstructive pulmonary disease, unspecified: Secondary | ICD-10-CM | POA: Diagnosis not present

## 2019-02-02 DIAGNOSIS — F325 Major depressive disorder, single episode, in full remission: Secondary | ICD-10-CM | POA: Diagnosis not present

## 2019-02-02 DIAGNOSIS — E785 Hyperlipidemia, unspecified: Secondary | ICD-10-CM | POA: Insufficient documentation

## 2019-02-02 DIAGNOSIS — I10 Essential (primary) hypertension: Secondary | ICD-10-CM

## 2019-02-02 DIAGNOSIS — I272 Pulmonary hypertension, unspecified: Secondary | ICD-10-CM

## 2019-02-02 MED ORDER — UMECLIDINIUM BROMIDE 62.5 MCG/INH IN AEPB: 1.0000 | INHALATION_SPRAY | Freq: Every day | RESPIRATORY_TRACT | 11 refills | Status: DC

## 2019-02-02 MED ORDER — ALBUTEROL SULFATE HFA 108 (90 BASE) MCG/ACT IN AERS: 2.0000 | INHALATION_SPRAY | Freq: Four times a day (QID) | RESPIRATORY_TRACT | 1 refills | Status: AC | PRN

## 2019-02-02 NOTE — Assessment & Plan Note (Signed)
Stable.  Continue Synthroid 100 mcg daily.

## 2019-02-02 NOTE — Assessment & Plan Note (Addendum)
Stable.  Continue Prozac 20 mg daily and Valium 5 mg twice daily as needed.

## 2019-02-02 NOTE — Assessment & Plan Note (Signed)
Stable.  Continue aspirin and pravastatin 20 mg daily.

## 2019-02-02 NOTE — Assessment & Plan Note (Signed)
Stable.  Continue telmisartan 20 mg daily.

## 2019-02-02 NOTE — Assessment & Plan Note (Signed)
Continue management per pulmonology.

## 2019-02-02 NOTE — Assessment & Plan Note (Signed)
Stable.  Will refill Incruse Ellipta and albuterol until she can get in to see her pulmonologist.

## 2019-02-02 NOTE — Progress Notes (Signed)
    Chief Complaint:  Jill Bryant is a 78 y.o. female who presents today for a virtual office visit with a chief complaint of HTN follow up.   Assessment/Plan:  Major depression with anxiety (HCC) Stable.  Continue Prozac 20 mg daily and Valium 5 mg twice daily as needed.  HTN (hypertension) Stable.  Continue telmisartan 20 mg daily.  COPD, severity to be determined ([place]) Stable.  Will refill Incruse Ellipta and albuterol until she can get in to see her pulmonologist.  Hypothyroidism Stable.  Continue Synthroid 100 mcg daily.  Dyslipidemia Stable.  Continue aspirin and pravastatin 20 mg daily.  Pulmonary hypertension, unspecified ([place]) Continue management per pulmonology.     Subjective:  HPI:  Her stable, chronic medical conditions are outlined below:  # CAD / PAD s/p aortobifemoral bypass / HTN - Follows with cardiology -On aspirin 81 mg daily and pravastatin 20 mg daily.  Tolerating well. -On telmisartan 20mg  daily -ROS: No reported chest pain or shortness of breath  # COPD / OSA on CPAP / PAH -Follows with pulmonology -On Incruse Ellipta and albuterol as needed.  She is on daily continuous oxygen at 3 L.  Symptoms are stable. - ROS: No reported worsening shortness of breath  # Depression / Anxiety -On Prozac 20 mg daily and Valium 5 mg twice daily as needed. Tolerating well -ROS: No reported SI or HI  ROS: Per HPI  PMH: She reports that she quit smoking about 15 years ago. She has never used smokeless tobacco. She reports that she does not drink alcohol or use drugs.      Objective/Observations  Physical Exam: Gen: NAD, resting comfortably Pulm: Normal work of breathing Neuro: Grossly normal, moves all extremities Psych: Normal affect and thought content  Virtual Visit via Video   I connected with Jill Bryant on 02/02/19 at  1:40 PM EDT by a video enabled telemedicine application and verified that I am speaking with the correct person using two  identifiers. I discussed the limitations of evaluation and management by telemedicine and the availability of in person appointments. The patient expressed understanding and agreed to proceed.   Patient location: Home Provider location: [place] participating in the virtual visit: Myself and patient     Algis Greenhouse. Jerline Pain, MD 02/02/2019 3:10 PM

## 2019-02-11 ENCOUNTER — Other Ambulatory Visit: Payer: Self-pay | Admitting: Family Medicine

## 2019-02-11 MED ORDER — TELMISARTAN 20 MG PO TABS: 20.0000 mg | ORAL_TABLET | Freq: Every day | ORAL | 1 refills | Status: DC

## 2019-02-11 MED ORDER — FERROUS SULFATE 325 (65 FE) MG PO TBEC: 325.0000 mg | DELAYED_RELEASE_TABLET | Freq: Every day | ORAL | 1 refills | Status: DC

## 2019-02-19 ENCOUNTER — Other Ambulatory Visit: Payer: Self-pay

## 2019-02-19 MED ORDER — UMECLIDINIUM BROMIDE 62.5 MCG/INH IN AEPB: 1.0000 | INHALATION_SPRAY | Freq: Every day | RESPIRATORY_TRACT | 1 refills | Status: AC

## 2019-02-19 MED ORDER — OMEPRAZOLE 20 MG PO CPDR: 20.0000 mg | DELAYED_RELEASE_CAPSULE | Freq: Every day | ORAL | 1 refills | Status: DC

## 2019-02-19 MED ORDER — FLUOXETINE HCL 20 MG PO CAPS: 20.0000 mg | ORAL_CAPSULE | Freq: Every day | ORAL | 1 refills | Status: DC

## 2019-02-19 MED ORDER — LEVOTHYROXINE SODIUM 100 MCG PO TABS: 100.0000 ug | ORAL_TABLET | Freq: Every day | ORAL | 1 refills | Status: DC

## 2019-02-19 MED ORDER — PRAVASTATIN SODIUM 20 MG PO TABS: 20.0000 mg | ORAL_TABLET | Freq: Every day | ORAL | 1 refills | Status: DC

## 2019-02-19 MED ORDER — TELMISARTAN 20 MG PO TABS: 20.0000 mg | ORAL_TABLET | Freq: Every day | ORAL | 1 refills | Status: DC

## 2019-02-23 ENCOUNTER — Other Ambulatory Visit: Payer: Self-pay

## 2019-02-23 MED ORDER — GABAPENTIN 300 MG PO CAPS: 600.0000 mg | ORAL_CAPSULE | Freq: Every day | ORAL | 1 refills | Status: DC

## 2019-03-02 ENCOUNTER — Other Ambulatory Visit: Payer: Self-pay | Admitting: Family Medicine

## 2019-03-02 MED ORDER — DIAZEPAM 5 MG PO TABS: 5.0000 mg | ORAL_TABLET | Freq: Two times a day (BID) | ORAL | 0 refills | Status: DC | PRN

## 2019-03-02 NOTE — Telephone Encounter (Signed)
See note

## 2019-03-02 NOTE — Telephone Encounter (Signed)
Please advise 

## 2019-03-02 NOTE — Telephone Encounter (Signed)
Copied from [place] [id number]. Topic: Quick Communication - Rx Refill/Question >> Mar 02, 2019 11:48 AM Virl Axe D wrote: Medication: diazepam (VALIUM) 5 MG tablet  Has the patient contacted their pharmacy? Yes.   (Agent: If no, request that the patient contact the pharmacy for the refill.) (Agent: If yes, when and what did the pharmacy advise?)  Preferred Pharmacy (with phone number or street name): [facility] [place], [place] [phone] (Phone) [phone] (Fax)    Agent: Please be advised that RX refills may take up to 3 business days. We ask that you follow-up with your pharmacy.

## 2019-04-17 ENCOUNTER — Encounter (HOSPITAL_COMMUNITY): Payer: Medicare Other

## 2019-04-27 ENCOUNTER — Other Ambulatory Visit: Payer: Self-pay | Admitting: Family Medicine

## 2019-05-15 ENCOUNTER — Inpatient Hospital Stay (HOSPITAL_COMMUNITY): Admission: RE | Admit: 2019-05-15 | Payer: Medicare Other | Source: Ambulatory Visit

## 2019-05-15 ENCOUNTER — Encounter (HOSPITAL_COMMUNITY): Payer: Medicare Other

## 2019-06-07 IMAGING — NM NM PULMONARY VENT & PERF
16 series · 16 of 16 positions shown · non-contrast
Comparison: None.

CLINICAL DATA: Elevated pulmonary arterial pressures

EXAM:
NUCLEAR MEDICINE VENTILATION - PERFUSION LUNG SCAN
TECHNIQUE: Ventilation images were obtained in multiple projections using
inhaled aerosol Sc-77m DTPA. Perfusion images were obtained in
multiple projections after intravenous injection of Sc-77m MAA.
RADIOPHARMACEUTICALS:  30.0 mCi of Sc-77m DTPA aerosol inhalation
and 4.0 mCi Ic33m MAA IV

[Series 1: ant/post vent · 4.14mm/px · 1 of 1 slices shown (1 of 2)]
[im 1/1]
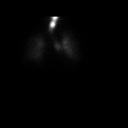

[Series 1: ant/post vent · 4.14mm/px · 1 of 1 slices shown (2 of 2)]
[im 1/1]
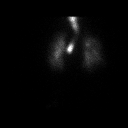

[Series 2: lao/rpo vent · 4.14mm/px · 1 of 1 slices shown (1 of 2)]
[im 1/1]
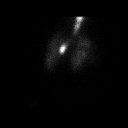

[Series 2: lao/rpo vent · 4.14mm/px · 1 of 1 slices shown (2 of 2)]
[im 1/1]
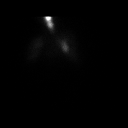

[Series 3: lpo/rao vent · 4.14mm/px · 1 of 1 slices shown (1 of 2)]
[im 1/1]
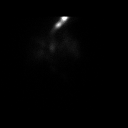

[Series 3: lpo/rao vent · 4.14mm/px · 1 of 1 slices shown (2 of 2)]
[im 1/1]
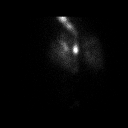

[Series 4: lt lat/rt lat vent · 4.14mm/px · 1 of 1 slices shown (1 of 2)]
[im 1/1]
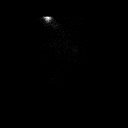

[Series 4: lt lat/rt lat vent · 4.14mm/px · 1 of 1 slices shown (2 of 2)]
[im 1/1]
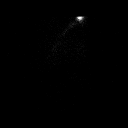

[Series 5: lt lat/rt lat perf · 4.14mm/px · 1 of 1 slices shown (1 of 2)]
[im 1/1]
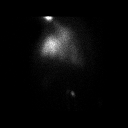

[Series 5: lt lat/rt lat perf · 4.14mm/px · 1 of 1 slices shown (2 of 2)]
[im 1/1]
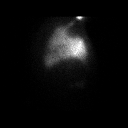

[Series 6: lpo/rao perf · 4.14mm/px · 1 of 1 slices shown (1 of 2)]
[im 1/1]
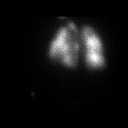

[Series 6: lpo/rao perf · 4.14mm/px · 1 of 1 slices shown (2 of 2)]
[im 1/1]
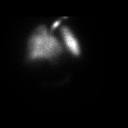

[Series 7: ant/post perf · 4.14mm/px · 1 of 1 slices shown (1 of 2)]
[im 1/1]
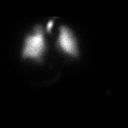

[Series 7: ant/post perf · 4.14mm/px · 1 of 1 slices shown (2 of 2)]
[im 1/1]
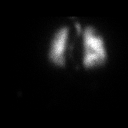

[Series 8: lao/rpo perf · 4.14mm/px · 1 of 1 slices shown (1 of 2)]
[im 1/1]
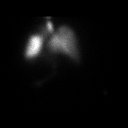

[Series 8: lao/rpo perf · 4.14mm/px · 1 of 1 slices shown (2 of 2)]
[im 1/1]
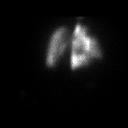

[16 of 16 positions shown; findings below may reference images not displayed]

FINDINGS: Ventilation: Multiple matched defects, most prominent in the right
upper lobe.

Perfusion: Multiple matched defects most prominent in the right
upper lobe.
IMPRESSION: Very low probability V/Q scan.

## 2019-06-09 ENCOUNTER — Other Ambulatory Visit: Payer: Self-pay

## 2019-06-09 ENCOUNTER — Ambulatory Visit (HOSPITAL_BASED_OUTPATIENT_CLINIC_OR_DEPARTMENT_OTHER)
Admission: RE | Admit: 2019-06-09 | Discharge: 2019-06-09 | Disposition: A | Discharge status: Home / Self Care | Payer: Medicare Other | Source: Ambulatory Visit | Attending: Internal Medicine | Admitting: Internal Medicine

## 2019-06-09 ENCOUNTER — Other Ambulatory Visit (HOSPITAL_COMMUNITY): Payer: Self-pay | Admitting: Internal Medicine

## 2019-06-09 ENCOUNTER — Ambulatory Visit (HOSPITAL_COMMUNITY)
Admission: RE | Admit: 2019-06-09 | Discharge: 2019-06-09 | Disposition: A | Discharge status: Home / Self Care | Payer: Medicare Other | Source: Ambulatory Visit | Attending: Cardiovascular Disease | Admitting: Cardiovascular Disease

## 2019-06-09 DIAGNOSIS — I739 Peripheral vascular disease, unspecified: Secondary | ICD-10-CM

## 2019-06-09 DIAGNOSIS — I6523 Occlusion and stenosis of bilateral carotid arteries: Secondary | ICD-10-CM

## 2019-06-09 DIAGNOSIS — I771 Stricture of artery: Secondary | ICD-10-CM

## 2019-06-09 DIAGNOSIS — Z95828 Presence of other vascular implants and grafts: Secondary | ICD-10-CM

## 2019-06-09 DIAGNOSIS — I779 Disorder of arteries and arterioles, unspecified: Secondary | ICD-10-CM

## 2019-06-11 ENCOUNTER — Other Ambulatory Visit: Payer: Self-pay | Admitting: Family Medicine

## 2019-06-12 NOTE — Telephone Encounter (Signed)
Last OV:  02/02/2019 Next OV:  None Last Fill:  04/29/2019  Please advise.

## 2019-06-15 ENCOUNTER — Other Ambulatory Visit: Payer: Self-pay | Admitting: Family Medicine

## 2019-07-30 ENCOUNTER — Other Ambulatory Visit: Payer: Self-pay | Admitting: Family Medicine

## 2019-07-30 HISTORY — PX: PORTA CATH INSERTION: CATH118285

## 2019-08-10 ENCOUNTER — Other Ambulatory Visit: Payer: Self-pay | Admitting: Family Medicine

## 2019-08-10 MED ORDER — LEVOTHYROXINE SODIUM 100 MCG PO TABS: ORAL_TABLET | ORAL | 3 refills | Status: DC

## 2019-08-10 NOTE — Telephone Encounter (Signed)
Medication: levothyroxine (SYNTHROID) 100 MCG tablet [[id number]]   Has the patient contacted their pharmacy? Yes  (Agent: If no, request that the patient contact the pharmacy for the refill.) (Agent: If yes, when and what did the pharmacy advise?)  Preferred Pharmacy (with phone number or street name): [place], [place] - [address] [phone] (Phone) [phone] (Fax)    Agent: Please be advised that RX refills may take up to 3 business days. We ask that you follow-up with your pharmacy.

## 2019-08-12 ENCOUNTER — Ambulatory Visit (INDEPENDENT_AMBULATORY_CARE_PROVIDER_SITE_OTHER): Payer: Medicare Other | Admitting: Family Medicine

## 2019-08-12 ENCOUNTER — Other Ambulatory Visit: Payer: Self-pay

## 2019-08-12 ENCOUNTER — Encounter: Payer: Self-pay | Admitting: Family Medicine

## 2019-08-12 VITALS — BP 82/59 | HR 62 | Temp 97.8°F | Ht 67.0 in | Wt 184.4 lb

## 2019-08-12 DIAGNOSIS — R5381 Other malaise: Secondary | ICD-10-CM

## 2019-08-12 DIAGNOSIS — I10 Essential (primary) hypertension: Secondary | ICD-10-CM | POA: Diagnosis not present

## 2019-08-12 DIAGNOSIS — G6289 Other specified polyneuropathies: Secondary | ICD-10-CM

## 2019-08-12 DIAGNOSIS — F325 Major depressive disorder, single episode, in full remission: Secondary | ICD-10-CM

## 2019-08-12 DIAGNOSIS — R413 Other amnesia: Secondary | ICD-10-CM

## 2019-08-12 DIAGNOSIS — E039 Hypothyroidism, unspecified: Secondary | ICD-10-CM

## 2019-08-12 MED ORDER — LEVOTHYROXINE SODIUM 88 MCG PO TABS: ORAL_TABLET | ORAL | Status: DC

## 2019-08-12 NOTE — Progress Notes (Signed)
Chief Complaint:  Jill Bryant is a 78 y.o. female who presents today with a chief complaint of memory issues.   Assessment/Plan:  Memory loss Had lengthy discussion with patient and her son regarding her ongoing memory issues.  It is possible this could be due to to her ongoing cancer diagnosis and treatment.  She additionally was found to have concerning findings on her MRI that could potentially represent metastasis.  She will be getting a repeat MRI in a couple of weeks.  Lab work has been essentially nondiagnostic.  She was found to be hypotensive today.  We will be stopping her blood pressure medications which hopefully should help some with her mentation.  Will place referral to neurology for further evaluation and discussion of possible treatment options.   Debility Will place referral to home health.  HTN (hypertension) Low blood pressure into the 80s over 50s today.  This potentially explain some of her mentation and mobility issues.  We will stop.  Advised her to continue monitoring over the next few weeks with goal 150/90 or lower.  Hypothyroidism Recent TSH normal.  Continue Synthroid 88 mcg daily.  Peripheral neuropathy On gabapentin 600 mg nightly.  Potentially could explain some of her symptoms.  If no improvement with above changes would consider trial off.  Major depression with anxiety ([place]) Discussed role of Valium and the potentially could be contributing some to her mobility and memory issues.  Will attempt to wean down.  Advised her to take half of a tablet instead of a whole tablet as needed.  She will follow-up with me in 3 months.     Subjective:  HPI:  Memory Issues Symptoms started about 6 to 12 months ago.  She thinks they have progressively gotten worse over the last 6 months or so.  She was recently diagnosed with lung cancer and has been undergoing chemotherapy for the past several months.  She is going to [facility] for this.  Her son is with her  today who does note that she has had some increasing issues with short-term memory and some confusion as well.  Patient has no concerns about long-term memory.  She has not forgotten faces or names.  She will often forget things that she was doing.  She also loses her train of thought during conversation.  She has not had any issues with leaving the stove or oven on.  She has occasionally had some issues with directions.  Currently sleeps about 12 hours/day.  She feels like her sleep is generally restful.  She does note that just prior to waking up she will often have a sensation that somebody else is in the house with her.  This is usually a comforting feeling.  Denies any audio or visual hallucinations.  No paranoia.  Has had labs and imaging done at [facility] as part of her lung cancer staging.  Recently her B12, CBC, TSH were normal.  She had an MRI done a couple months ago which showed a 6 mm nodule on her right caudate.  She is planning on getting a repeat MRI done in a few weeks.  Patient is also had some issues with mobility.  She feels very unsteady on her feet.  She has not lost consciousness however does feel like she is coming close to blacking out several times.  She currently lives in a retirement community and has a physical therapist from their community work with her once or twice per week.  She  does not have any assistance in her home.  Her stable, chronic medical conditions are outlined below:  # CAD / PAD s/p aortobifemoral bypass / HTN - Follows with cardiology -On aspirin 81 mg daily and pravastatin 20 mg daily.  Tolerating well. -On telmisartan 20mg  daily and tolerating well. -ROS: No reported chest pain or shortness of breath  # COPD / OSA on CPAP / PAH -Follows with pulmonology -On Incruse Ellipta and albuterol as needed.  She is on daily continuous oxygen at 3 L.  Symptoms are stable. - ROS: No reported worsening shortness of breath  # Depression / Anxiety -On Prozac  20 mg daily and Valium 5 mg twice daily as needed. Tolerating well -ROS: No reported SI or HI  # Hypothyroidism -On Synthroid 88 mcg daily.  Tolerating well.  # Neuropathy -On gabapentin 600 mg at bedtime and tolerating well.  % CKD 3 - Follows with nephrology  % Recurrent UTI - Has followed with urology in the past.  No current symptoms.  %  Lung Cancer -Follows with oncology.  - On keytruda  ROS: Per HPI  PMH: She reports that she quit smoking about 15 years ago. She has never used smokeless tobacco. She reports that she does not drink alcohol or use drugs.      Objective:  Physical Exam: BP (!) 82/59   Pulse 62   Temp 97.8 F (36.6 C)   Ht 5\' 7"  (1.702 m)   Wt 184 lb 6.1 oz (83.6 kg)   SpO2 95%   BMI 28.88 kg/m   Gen: NAD, resting comfortably CV: Regular rate and rhythm with no murmurs appreciated Pulm: Normal work of breathing, clear to auscultation bilaterally with no crackles, wheezes, or rhonchi GI: Normal bowel sounds present. Soft, Nontender, Nondistended. MSK: No edema, cyanosis, or clubbing noted Skin: Warm, dry Neuro: Grossly normal, moves all extremities.  Cranial nerves II through XII intact.  3 out of 3 delayed word recall Psych: Normal affect and thought content  Time Spent: I spent 60 minutes face-to-face with the patient, with more than half spent on counseling for management plan for her memory loss, debility, hypothyroidism, anxiety, hypertension, and neuropathy.       Algis Greenhouse. Jerline Pain, MD 08/12/2019 12:23 PM

## 2019-08-12 NOTE — Assessment & Plan Note (Signed)
Will place referral to home health.

## 2019-08-12 NOTE — Assessment & Plan Note (Signed)
Had lengthy discussion with patient and her son regarding her ongoing memory issues.  It is possible this could be due to to her ongoing cancer diagnosis and treatment.  She additionally was found to have concerning findings on her MRI that could potentially represent metastasis.  She will be getting a repeat MRI in a couple of weeks.  Lab work has been essentially nondiagnostic.  She was found to be hypotensive today.  We will be stopping her blood pressure medications which hopefully should help some with her mentation.  Will place referral to neurology for further evaluation and discussion of possible treatment options.

## 2019-08-12 NOTE — Assessment & Plan Note (Signed)
On gabapentin 600 mg nightly.  Potentially could explain some of her symptoms.  If no improvement with above changes would consider trial off.

## 2019-08-12 NOTE — Assessment & Plan Note (Signed)
Low blood pressure into the 80s over 50s today.  This potentially explain some of her mentation and mobility issues.  We will stop.  Advised her to continue monitoring over the next few weeks with goal 150/90 or lower.

## 2019-08-12 NOTE — Assessment & Plan Note (Signed)
Recent TSH normal.  Continue Synthroid 88 mcg daily.

## 2019-08-12 NOTE — Assessment & Plan Note (Signed)
Discussed role of Valium and the potentially could be contributing some to her mobility and memory issues.  Will attempt to wean down.  Advised her to take half of a tablet instead of a whole tablet as needed.  She will follow-up with me in 3 months.

## 2019-08-12 NOTE — Patient Instructions (Signed)
It was very nice to see you today!  Please stop your blood pressure medication.  You can wean down on the Valium.  Please try taking half a tablet instead of a whole tablet when you needed.  I will place referral for you to get home health.  Also place referral if you to see the neurologist for your memory issues.  Come back to see me in 3 months, or sooner if needed.  Take care, Dr Jerline Pain  Please try these tips to maintain a healthy lifestyle:   Eat at least 3 REAL meals and 1-2 snacks per day.  Aim for no more than 5 hours between eating.  If you eat breakfast, please do so within one hour of getting up.    Obtain twice as many fruits/vegetables as protein or carbohydrate foods for both lunch and dinner. (Half of each meal should be fruits/vegetables, one quarter protein, and one quarter starchy carbs)   Cut down on sweet beverages. This includes juice, soda, and sweet tea.    Exercise at least 150 minutes every week.

## 2019-08-14 ENCOUNTER — Other Ambulatory Visit: Payer: Medicare Other

## 2019-08-14 ENCOUNTER — Encounter: Payer: Self-pay | Admitting: Neurology

## 2019-08-14 ENCOUNTER — Ambulatory Visit: Payer: Medicare Other | Admitting: Neurology

## 2019-08-14 ENCOUNTER — Other Ambulatory Visit: Payer: Self-pay

## 2019-08-14 VITALS — BP 115/76 | HR 68 | Ht 67.0 in | Wt 184.2 lb

## 2019-08-14 DIAGNOSIS — G3184 Mild cognitive impairment, so stated: Secondary | ICD-10-CM

## 2019-08-14 NOTE — Patient Instructions (Signed)
1. Bloodwork for B12  2. Proceed with MRI brain as scheduled  3. Follow-up in 6 months, call for any changes  FALL PRECAUTIONS: Be cautious when walking. Scan the area for obstacles that may increase the risk of trips and falls. When getting up in the mornings, sit up at the edge of the bed for a few minutes before getting out of bed. Consider elevating the bed at the head end to avoid drop of blood pressure when getting up. Walk always in a well-lit room (use night lights in the walls). Avoid area rugs or power cords from appliances in the middle of the walkways. Use a walker or a cane if necessary and consider physical therapy for balance exercise. Get your eyesight checked regularly.  FINANCIAL OVERSIGHT: Supervision, especially oversight when making financial decisions or transactions is also recommended if changes are identified  HOME SAFETY: Consider the safety of the kitchen when operating appliances like stoves, microwave oven, and blender. Consider having supervision and share cooking responsibilities until no longer able to participate in those. Accidents with firearms and other hazards in the house should be identified and addressed as well.  DRIVING: Regarding driving, in patients with progressive memory problems, driving will be impaired. We advise to have someone else do the driving if trouble finding directions or if minor accidents are reported. Independent driving assessment is available to determine safety of driving.  ABILITY TO BE LEFT ALONE: If patient is unable to contact 911 operator, consider using LifeLine, or when the need is there, arrange for someone to stay with patients. Smoking is a fire hazard, consider supervision or cessation. Risk of wandering should be assessed by caregiver and if detected at any point, supervision and safe proof recommendations should be instituted.  MEDICATION SUPERVISION: Inability to self-administer medication needs to be constantly addressed.  Implement a mechanism to ensure safe administration of the medications.  RECOMMENDATIONS FOR ALL PATIENTS WITH MEMORY PROBLEMS: 1. Continue to exercise (Recommend 30 minutes of walking everyday, or 3 hours every week) 2. Increase social interactions - continue going to [place] and enjoy social gatherings with friends and family 3. Eat healthy, avoid fried foods and eat more fruits and vegetables 4. Maintain adequate blood pressure, blood sugar, and blood cholesterol level. Reducing the risk of stroke and cardiovascular disease also helps promoting better memory. 5. Avoid stressful situations. Live a simple life and avoid aggravations. Organize your time and prepare for the next day in anticipation. 6. Sleep well, avoid any interruptions of sleep and avoid any distractions in the bedroom that may interfere with adequate sleep quality 7. Avoid sugar, avoid sweets as there is a strong link between excessive sugar intake, diabetes, and cognitive impairment The Mediterranean diet has been shown to help patients reduce the risk of progressive memory disorders and reduces cardiovascular risk. This includes eating fish, eat fruits and green leafy vegetables, nuts like almonds and hazelnuts, walnuts, and also use olive oil. Avoid fast foods and fried foods as much as possible. Avoid sweets and sugar as sugar use has been linked to worsening of memory function.

## 2019-08-14 NOTE — Progress Notes (Signed)
NEUROLOGY CONSULTATION NOTE  Jill Bryant MRN: [id number] DOB: 02/11/1941  Referring provider: Dr. Dimas Chyle Primary care provider: Dr. Dimas Chyle  Reason for consult:  Memory loss  Dear Dr Jerline Pain:  Thank you for your kind referral of Jill Bryant for consultation of the above symptoms. Although her history is well known to you, please allow me to reiterate it for the purpose of our medical record. The patient was accompanied to the clinic by her daughter-in-law [name] who also provides collateral information. Records and images were personally reviewed where available.   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 She feels her memory is not as good as it used to be. [name] reports that she has always been forgetful, but over the past month, symptoms have worsened that she can never remember doctor appointments. She knew she had an appointment today but had the name and address wrong. She would not remember if someone called her or if she had called. She has had multiple doctor appointments and started immunotherapy 9 weeks ago, repeatedly asking when her treatments are. She lives alone in [place]. Family provides meals. She states the last time she drove was 2 months ago, [name] thinks it was longer. [name] reports the last time she drove she got lost and had to use her GPS. She has a pillbox and states she rarely forgets her medications. Family has not checked behind and cannot confirm this. She missed a bill payment one time. She misplaces things frequently and lost her glasses. She is independent with dressing and bathing. [name] thinks she is sleeping a lot and not eating well, losing weight. She has been more irritable the past couple of months, no paranoia or hallucinations. She  had cut down on Valium, she does not take it daily. Her mother had Alzheimer's disease. No history of significant head injuries or alcohol use.  She denies any headaches, dysarthria/dysphagia, neck pain, bowel/bladder dysfunction, anosmia. She has occasional lightheadedness and blurred vision. She ahs low back pain and occasional paresthesias in her feet more than hands. Gabapentin helps, otherwise she cannot sleep because her feet are on fire. She has occasional hand tremors that have affected her handwriting. She fell 3 times in 4 days in December 2019, last fall was a few months ago. She has had weird dreams, waking up feeling hurt.   She had an MRI brain with and without contrast last 05/2019 which showed a 6 mm focus of nodular enhancement in the right caudate head. As a solitary finding in this location both metastasis and subacute infarct are considered. Mild small vessel disease, cerebral volume loss moderate. Follow-up MRI is scheduled soon.   Laboratory Data: Lab Results  Component Value Date   TSH 1.664 10/10/2018    PAST MEDICAL HISTORY: Past Medical History:  Diagnosis Date   Anemia    Anxiety    Arthritis    Asthma    Back pain, chronic    CHF (congestive heart failure) (HCC)    COPD (chronic obstructive pulmonary disease) (HCC)    Depression    GERD (gastroesophageal reflux disease)    Headache    migraines   Hyperlipidemia    Hypertension    Hypothyroidism    Kidney atrophy    with cysts on right   Lumbar stenosis  Pneumonia    PONV (postoperative nausea and vomiting)    Retinal tear    right eye   Seizures (HCC)    with morphine   Shortness of breath dyspnea    with exertion   Sleep apnea    wears CPAP   TIA (transient ischemic attack)    UTI (lower urinary tract infection)    Wears glasses     PAST SURGICAL HISTORY: Past Surgical History:  Procedure Laterality Date   aortic endarterectomy     aortobifemoral bypass      BREAST SURGERY     cyst aspiration right breast   CARDIAC SURGERY     valve replacement 2013; aorta replacement 2002   CATARACT EXTRACTION W/ INTRAOCULAR LENS  IMPLANT, BILATERAL     CHOLECYSTECTOMY     COLONOSCOPY     HERNIA REPAIR     LUMBAR EPIDURAL INJECTION     LUMBAR LAMINECTOMY/DECOMPRESSION MICRODISCECTOMY Left 02/27/2016   Procedure: Laminectomy and Foraminotomy - Lumbar four-five -Lumbar three-four - left diskectomy Lumbar three-four;  Surgeon: Kary Kos, MD;  Location: [facility];  Service: Neurosurgery;  Laterality: Left;   MULTIPLE TOOTH EXTRACTIONS     TUBAL LIGATION      MEDICATIONS: Current Outpatient Medications on File Prior to Visit  Medication Sig Dispense Refill   acetaminophen (TYLENOL) 500 MG tablet Take 1,000 mg by mouth every 6 (six) hours as needed (for pain or headaches).      albuterol (ACCUNEB) 0.63 MG/3ML nebulizer solution Take 1 ampule by nebulization 2 (two) times daily as needed for wheezing or shortness of breath (or congestion).      albuterol (PROVENTIL HFA;VENTOLIN HFA) 108 (90 Base) MCG/ACT inhaler Inhale 2 puffs into the lungs every 6 (six) hours as needed for wheezing or shortness of breath. 18 g 1   aspirin EC 81 MG tablet Take 81 mg by mouth daily.     calcium carbonate (CALCIUM 600) 600 MG TABS tablet Take 600 mg by mouth 2 (two) times daily with a meal.     cetirizine (ZYRTEC) 10 MG tablet Take 10 mg by mouth daily.      Cholecalciferol (VITAMIN D-3) 25 MCG (1000 UT) CAPS Take 2,000 Units by mouth daily.     conjugated estrogens (PREMARIN) vaginal cream Place 1 Applicatorful vaginally See admin instructions. Insert a pea-sized amount of cream into the vagina 2-3 times a week     diazepam (VALIUM) 5 MG tablet TAKE 1 TABLET BY MOUTH  TWICE DAILY AS NEEDED FOR  ANXIETY 60 tablet 5   diclofenac sodium (VOLTAREN) 1 % GEL Apply 4 g topically 4 (four) times daily. 1 Tube 0   FLUoxetine (PROZAC) 20 MG capsule Take 1 capsule (20 mg  total) by mouth daily. 90 capsule 1   gabapentin (NEURONTIN) 300 MG capsule TAKE 2 CAPSULES BY MOUTH AT BEDTIME 180 capsule 3   levothyroxine (SYNTHROID) 88 MCG tablet TAKE 1 TABLET BY MOUTH  DAILY BEFORE BREAKFAST     nitroGLYCERIN (NITROSTAT) 0.4 MG SL tablet Place 1 tablet (0.4 mg total) under the tongue every 5 (five) minutes. (Patient taking differently: Place 0.4 mg under the tongue every 5 (five) minutes as needed for chest pain. ) 25 tablet 3   Omega-3 1000 MG CAPS Take 1,000 mg by mouth daily.      omeprazole (PRILOSEC) 20 MG capsule TAKE 1 CAPSULE BY MOUTH  DAILY BEFORE BREAKFAST 90 capsule 3   OXYGEN Inhale 2-4 L into the lungs at bedtime.  pravastatin (PRAVACHOL) 20 MG tablet TAKE 1 TABLET BY MOUTH AT  BEDTIME 90 tablet 3   umeclidinium bromide (INCRUSE ELLIPTA) 62.5 MCG/INH AEPB Inhale 1 puff into the lungs daily. 90 each 1   vitamin B-12 (CYANOCOBALAMIN) 1000 MCG tablet Take 1,000 mcg by mouth daily.     No current facility-administered medications on file prior to visit.     ALLERGIES: Allergies  Allergen Reactions   Tape Rash and Other (See Comments)    SKIN IS VERY THIN; TAPE TEARS THE SKIN!!   Amlodipine Swelling and Other (See Comments)    Feet/toes swelling   Metoprolol Other (See Comments)    Bradycardia and fatigue   Morphine And Related Other (See Comments)    Hallucinations/seizures    FAMILY HISTORY: Family History  Problem Relation Age of Onset   Other Mother    Alzheimer's disease Father    Heart disease Maternal Grandmother    Heart disease Maternal Aunt     SOCIAL HISTORY: Social History   Socioeconomic History   Marital status: Widowed    Spouse name: Not on file   Number of children: 1   Years of education: Not on file   Highest education level: Not on file  Occupational History   Not on file  Social Needs   Financial resource strain: Not on file   Food insecurity    Worry: Not on file    Inability: Not on  file   Transportation needs    Medical: Not on file    Non-medical: Not on file  Tobacco Use   Smoking status: Former Smoker    Quit date: 10/30/2003    Years since quitting: 15.8   Smokeless tobacco: Never Used  Substance and Sexual Activity   Alcohol use: No   Drug use: No   Sexual activity: Not Currently    Partners: Male  Lifestyle   Physical activity    Days per week: Not on file    Minutes per session: Not on file   Stress: Not on file  Relationships   Social connections    Talks on phone: Not on file    Gets together: Not on file    Attends religious service: Not on file    Active member of club or organization: Not on file    Attends meetings of clubs or organizations: Not on file    Relationship status: Not on file   Intimate partner violence    Fear of current or ex partner: Not on file    Emotionally abused: Not on file    Physically abused: Not on file    Forced sexual activity: Not on file  Other Topics Concern   Not on file  Social History Narrative   Right handed      Some College      Lives alone      Has walker      Does not drive    REVIEW OF SYSTEMS: Constitutional: No fevers, chills, or sweats, no generalized fatigue, change in appetite Eyes: No visual changes, double vision, eye pain Ear, nose and throat: No hearing loss, ear pain, nasal congestion, sore throat Cardiovascular: No chest pain, palpitations Respiratory:  No shortness of breath at rest or with exertion, wheezes GastrointestinaI: No nausea, vomiting, diarrhea, abdominal pain, fecal incontinence Genitourinary:  No dysuria, urinary retention or frequency Musculoskeletal:  + neck pain, back pain Integumentary: No rash, pruritus, skin lesions Neurological: as above Psychiatric: No depression, insomnia, anxiety Endocrine: No palpitations,  fatigue, diaphoresis, mood swings, change in appetite, change in weight, increased thirst Hematologic/Lymphatic:  No anemia, purpura,  petechiae. Allergic/Immunologic: no itchy/runny eyes, nasal congestion, recent allergic reactions, rashes  PHYSICAL EXAM: Vitals:   08/14/19 0901  BP: 115/76  Pulse: 68  SpO2: 95%   General: No acute distress Head:  Normocephalic/atraumatic Skin/Extremities: No rash, no edema Neurological Exam: Mental status: alert and oriented to person, place, and time, no dysarthria or aphasia, Fund of knowledge is appropriate.  Recent and remote memory are impaired.  Attention and concentration are reduced.  [place] Exam 08/24/2019  Weekday Correct 1  Current year 1  What state are we in? 1  Amount spent 1  Amount left 0  # of Animals 2  5 objects recall 3  Number series 2  Hour markers 1  Time correct 0  Placed X in triangle correctly 1  Largest Figure 1  Name of female 2  Date back to work 0  Type of work 2  State she lived in 2  Total score 20    Cranial nerves: CN I: not tested CN II: pupils equal, round and reactive to light, visual fields intact CN III, IV, VI:  full range of motion, no nystagmus, no ptosis CN V: facial sensation intact CN VII: upper and lower face symmetric CN VIII: hearing intact to conversation CN IX, X: gag intact, uvula midline CN XI: sternocleidomastoid and trapezius muscles intact CN XII: tongue midline Bulk & Tone: normal, no fasciculations. Motor: 5/5 throughout with no pronator drift. Sensation: intact to light touch, cold, pin, vibration and joint position sense.  No extinction to double simultaneous stimulation.  Romberg test negative Deep Tendon Reflexes: +2 throughout, no ankle clonus Plantar responses: downgoing bilaterally Cerebellar: no incoordination on finger to nose testing Gait: narrow-based and steady, able to tandem walk adequately. 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 Her neurological exam is non-focal, SLUMS score today 20/30. 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 MRI brain in 05/2019 showed a 6 mm focus of nodular enhancement in the right caudate head, follow-up MRI is coming up, however this finding in itself would not cause cognitive issues. Check B12 level. We discussed that there is no evidence that medications such as Donepezil have any significant effect in MCI and sometimes side effects are hard to tolerate. They can alter the slope of progression for a short period of time in dementia and patients may have better function longer. She would like to hold off for now as she undergoes cancer treatments. We discussed the importance of control of vascular risk factors, physical exercise, and brain stimulation exercises for brain health. Continue close supervision. Follow-up in 6 months, they know to call for any changes.   Thank you for allowing me to participate in the care of this patient. Please do not hesitate to call for any questions or concerns.   Ellouise Newer, M.D.  CC: Dr. Jerline Pain

## 2019-08-15 LAB — VITAMIN B12: Vitamin B-12: >2000 pg/mL — ABNORMAL HIGH (ref 200–1100)

## 2019-08-17 ENCOUNTER — Telehealth: Payer: Self-pay

## 2019-08-17 NOTE — Telephone Encounter (Signed)
-----   Message from Cameron Sprang, MD sent at 08/17/2019  9:06 AM EDT ----- Pls let daughter know the B12 level was good, thanks

## 2019-08-17 NOTE — Telephone Encounter (Signed)
Pt informed of B12 results

## 2019-08-31 ENCOUNTER — Telehealth: Payer: Self-pay | Admitting: Family Medicine

## 2019-08-31 NOTE — Telephone Encounter (Signed)
Home Health Verbal Orders - Caller/Agency: Doug/ [facility] Number: [phone] [id number]  Requesting OT/PT/Skilled Nursing/Social Work/Speech Therapy: PT Frequency: 2x for 3wks, 1x for 3wks.

## 2019-08-31 NOTE — Telephone Encounter (Signed)
See note

## 2019-09-01 ENCOUNTER — Other Ambulatory Visit: Payer: Self-pay

## 2019-09-01 NOTE — Telephone Encounter (Signed)
Notified Doug gave a ok for verbal order

## 2019-09-02 ENCOUNTER — Ambulatory Visit (INDEPENDENT_AMBULATORY_CARE_PROVIDER_SITE_OTHER): Payer: Medicare Other | Admitting: Family Medicine

## 2019-09-02 ENCOUNTER — Encounter: Payer: Self-pay | Admitting: Family Medicine

## 2019-09-02 VITALS — BP 162/96 | HR 73 | Temp 97.1°F | Ht 67.0 in | Wt 185.4 lb

## 2019-09-02 DIAGNOSIS — M546 Pain in thoracic spine: Secondary | ICD-10-CM

## 2019-09-02 MED ORDER — METHYLPREDNISOLONE ACETATE 80 MG/ML IJ SUSP
80.0000 mg | Freq: Once | INTRAMUSCULAR | Status: AC
  Administered 2019-09-02: 80 mg via INTRAMUSCULAR

## 2019-09-02 MED ORDER — PREDNISONE 50 MG PO TABS: ORAL_TABLET | ORAL | 0 refills | Status: DC

## 2019-09-02 NOTE — Patient Instructions (Signed)
It was very nice to see you today!  I think you may have an inflamed muscle.  It is also possible this could represent early shingles.  We will give you a cortisone shot today.  Please start the prednisone tomorrow.  Let me know if you have any rash develop or if your symptoms are not improving by early next week.  Take care, Dr Jerline Pain  Please try these tips to maintain a healthy lifestyle:   Eat at least 3 REAL meals and 1-2 snacks per day.  Aim for no more than 5 hours between eating.  If you eat breakfast, please do so within one hour of getting up.    Obtain twice as many fruits/vegetables as protein or carbohydrate foods for both lunch and dinner. (Half of each meal should be fruits/vegetables, one quarter protein, and one quarter starchy carbs)   Cut down on sweet beverages. This includes juice, soda, and sweet tea.    Exercise at least 150 minutes every week.

## 2019-09-02 NOTE — Progress Notes (Signed)
   Chief Complaint:  Jill Bryant is a 78 y.o. female who presents today with a chief complaint of shoulder Bryant.   Assessment/Plan:  Right thoracic back Bryant Shoulder exam relatively intact.  No signs of joint pathology or rotator cuff pathology.  It is possible that she could be having a early shingles outbreak however given lack of rash it is difficult to make this diagnosis.  Differential also includes muscular strain/spasm or thoracic nerve impingement/irritation. She does have a history of lung cancer in her right lower lung however given lack of respiratory symptoms, I do not think this is playing a role in her current Bryant.  We will give 80 mg of IM Depo-Medrol today.  Start prednisone tomorrow.  Discussed reasons to return to care.  Follow up as needed.     Subjective:  HPI:  Shoulder Bryant  Started 2 weeks ago. Located in right shoulder and radiates into back. Getting worse. Tried heating pad which works well. Tylenol worked modestly. Worse with movement. No other obvious aggravating or alleviating factors.  No rashes.  Described as a burning sensation.  Nothing like this in the past.  ROS: Per HPI  PMH: She reports that she quit smoking about 15 years ago. She has never used smokeless tobacco. She reports that she does not drink alcohol or use drugs.      Objective:  Physical Exam: BP (!) 162/96   Pulse 73   Temp (!) 97.1 F (36.2 C)   Ht 5\' 7"  (1.702 m)   Wt 185 lb 6.1 oz (84.1 kg)   SpO2 96%   BMI 29.03 kg/m   Gen: NAD, resting comfortably MSK: -Right shoulder: No deformities.  Nontender to palpation.  Full range of motion.  Slight tenderness with above head motion.  Supraspinatus testing intact -Back: No deformities.  Tender to palpation along right lateral thoracic back along inferior pole of scapula.  No rashes or lesions present.     Jill Bryant. Jill Pain, MD 09/02/2019 9:46 AM

## 2019-09-10 NOTE — Telephone Encounter (Signed)
Copied from [address] [id number]. Topic: General - Other >> Sep 10, 2019  3:30 PM Leward Quan A wrote: Reason for CRM: Dr Rosette Reveal called to say that he is seeing patient at the Cancer center and wanted to inform Dr Jerline Pain that the patient is becoming more depressed. Patient stated that she would prefer that Dr Jerline Pain help her to get a Psychiatrist. Say that if there are any questions please call him at Ph#  [phone]

## 2019-09-11 NOTE — Telephone Encounter (Signed)
Please advise 

## 2019-09-14 ENCOUNTER — Telehealth: Payer: Self-pay | Admitting: Family Medicine

## 2019-09-14 NOTE — Telephone Encounter (Signed)
See note

## 2019-09-14 NOTE — Telephone Encounter (Signed)
Left voice message to call clinic

## 2019-09-14 NOTE — Telephone Encounter (Signed)
Please call pt and check on her. I am ok with psych referral if she wishes.  Algis Greenhouse. Jerline Pain, MD 09/14/2019 8:37 AM

## 2019-09-14 NOTE — Telephone Encounter (Signed)
PLS HAVE KHAMIKA RETURN CALL TO PT , PT  RETURNED CALLED FROM YESTERDAY

## 2019-09-15 NOTE — Telephone Encounter (Signed)
Spoke with patient.

## 2019-09-15 NOTE — Telephone Encounter (Signed)
Patient stated that she is doing better.she is working with therapy and able to get around more.She declined seeing a therapist at this time.

## 2019-09-18 ENCOUNTER — Telehealth: Payer: Self-pay | Admitting: Family Medicine

## 2019-09-18 NOTE — Telephone Encounter (Signed)
See note

## 2019-09-18 NOTE — Telephone Encounter (Signed)
Doug calling with [facility] states that pt had a missed visit for PT and would like to move to the week of 09/27/19. Please advise

## 2019-09-21 NOTE — Telephone Encounter (Signed)
Notified Doug ok with the updated appt.

## 2019-09-25 ENCOUNTER — Telehealth: Payer: Self-pay | Admitting: Family Medicine

## 2019-09-25 NOTE — Telephone Encounter (Signed)
Jill Bryant calling with [facility] would like [place] to know that pt had a missed visit for PT this week and would like to move that visit to the week of 10/18/19. Please advise

## 2019-09-28 NOTE — Telephone Encounter (Signed)
See note

## 2019-09-30 NOTE — Telephone Encounter (Signed)
Spoke with Jill Bryant to change appt

## 2019-10-05 ENCOUNTER — Other Ambulatory Visit: Payer: Self-pay

## 2019-10-05 ENCOUNTER — Encounter (HOSPITAL_COMMUNITY): Payer: Self-pay | Admitting: Emergency Medicine

## 2019-10-05 ENCOUNTER — Emergency Department (HOSPITAL_COMMUNITY): Payer: Medicare Other

## 2019-10-05 ENCOUNTER — Observation Stay (HOSPITAL_COMMUNITY)
Admission: EM | Admit: 2019-10-05 | Discharge: 2019-10-06 | Disposition: A | Discharge status: Home / Self Care | Payer: Medicare Other | Attending: Internal Medicine | Admitting: Internal Medicine

## 2019-10-05 DIAGNOSIS — I2510 Atherosclerotic heart disease of native coronary artery without angina pectoris: Secondary | ICD-10-CM | POA: Insufficient documentation

## 2019-10-05 DIAGNOSIS — K5730 Diverticulosis of large intestine without perforation or abscess without bleeding: Secondary | ICD-10-CM | POA: Insufficient documentation

## 2019-10-05 DIAGNOSIS — J9611 Chronic respiratory failure with hypoxia: Secondary | ICD-10-CM | POA: Insufficient documentation

## 2019-10-05 DIAGNOSIS — J984 Other disorders of lung: Secondary | ICD-10-CM | POA: Diagnosis not present

## 2019-10-05 DIAGNOSIS — I7101 Dissection of thoracic aorta: Secondary | ICD-10-CM | POA: Diagnosis present

## 2019-10-05 DIAGNOSIS — Z20828 Contact with and (suspected) exposure to other viral communicable diseases: Secondary | ICD-10-CM | POA: Insufficient documentation

## 2019-10-05 DIAGNOSIS — J439 Emphysema, unspecified: Secondary | ICD-10-CM | POA: Insufficient documentation

## 2019-10-05 DIAGNOSIS — Z66 Do not resuscitate: Secondary | ICD-10-CM | POA: Diagnosis not present

## 2019-10-05 DIAGNOSIS — I712 Thoracic aortic aneurysm, without rupture: Secondary | ICD-10-CM | POA: Diagnosis not present

## 2019-10-05 DIAGNOSIS — R079 Chest pain, unspecified: Principal | ICD-10-CM | POA: Insufficient documentation

## 2019-10-05 DIAGNOSIS — F325 Major depressive disorder, single episode, in full remission: Secondary | ICD-10-CM | POA: Diagnosis present

## 2019-10-05 DIAGNOSIS — K219 Gastro-esophageal reflux disease without esophagitis: Secondary | ICD-10-CM | POA: Insufficient documentation

## 2019-10-05 DIAGNOSIS — G629 Polyneuropathy, unspecified: Secondary | ICD-10-CM | POA: Insufficient documentation

## 2019-10-05 DIAGNOSIS — I161 Hypertensive emergency: Secondary | ICD-10-CM | POA: Diagnosis not present

## 2019-10-05 DIAGNOSIS — Z9981 Dependence on supplemental oxygen: Secondary | ICD-10-CM | POA: Diagnosis not present

## 2019-10-05 DIAGNOSIS — Z7951 Long term (current) use of inhaled steroids: Secondary | ICD-10-CM | POA: Insufficient documentation

## 2019-10-05 DIAGNOSIS — E785 Hyperlipidemia, unspecified: Secondary | ICD-10-CM | POA: Insufficient documentation

## 2019-10-05 DIAGNOSIS — N183 Chronic kidney disease, stage 3 unspecified: Secondary | ICD-10-CM | POA: Diagnosis not present

## 2019-10-05 DIAGNOSIS — I509 Heart failure, unspecified: Secondary | ICD-10-CM | POA: Diagnosis not present

## 2019-10-05 DIAGNOSIS — F419 Anxiety disorder, unspecified: Secondary | ICD-10-CM | POA: Insufficient documentation

## 2019-10-05 DIAGNOSIS — I10 Essential (primary) hypertension: Secondary | ICD-10-CM | POA: Diagnosis present

## 2019-10-05 DIAGNOSIS — E039 Hypothyroidism, unspecified: Secondary | ICD-10-CM | POA: Insufficient documentation

## 2019-10-05 DIAGNOSIS — Z888 Allergy status to other drugs, medicaments and biological substances status: Secondary | ICD-10-CM | POA: Insufficient documentation

## 2019-10-05 DIAGNOSIS — I700 Atherosclerosis of aorta: Secondary | ICD-10-CM | POA: Insufficient documentation

## 2019-10-05 DIAGNOSIS — I7100 Dissection of unspecified site of aorta: Secondary | ICD-10-CM

## 2019-10-05 DIAGNOSIS — K449 Diaphragmatic hernia without obstruction or gangrene: Secondary | ICD-10-CM | POA: Diagnosis not present

## 2019-10-05 DIAGNOSIS — F329 Major depressive disorder, single episode, unspecified: Secondary | ICD-10-CM | POA: Diagnosis not present

## 2019-10-05 DIAGNOSIS — Z8673 Personal history of transient ischemic attack (TIA), and cerebral infarction without residual deficits: Secondary | ICD-10-CM | POA: Insufficient documentation

## 2019-10-05 DIAGNOSIS — I130 Hypertensive heart and chronic kidney disease with heart failure and stage 1 through stage 4 chronic kidney disease, or unspecified chronic kidney disease: Secondary | ICD-10-CM | POA: Diagnosis not present

## 2019-10-05 DIAGNOSIS — Z952 Presence of prosthetic heart valve: Secondary | ICD-10-CM

## 2019-10-05 DIAGNOSIS — J449 Chronic obstructive pulmonary disease, unspecified: Secondary | ICD-10-CM | POA: Diagnosis not present

## 2019-10-05 DIAGNOSIS — Z8249 Family history of ischemic heart disease and other diseases of the circulatory system: Secondary | ICD-10-CM | POA: Insufficient documentation

## 2019-10-05 DIAGNOSIS — I71019 Dissection of thoracic aorta, unspecified: Secondary | ICD-10-CM

## 2019-10-05 DIAGNOSIS — Z79890 Hormone replacement therapy (postmenopausal): Secondary | ICD-10-CM | POA: Insufficient documentation

## 2019-10-05 DIAGNOSIS — I272 Pulmonary hypertension, unspecified: Secondary | ICD-10-CM | POA: Insufficient documentation

## 2019-10-05 DIAGNOSIS — Z79899 Other long term (current) drug therapy: Secondary | ICD-10-CM | POA: Insufficient documentation

## 2019-10-05 DIAGNOSIS — G4733 Obstructive sleep apnea (adult) (pediatric): Secondary | ICD-10-CM | POA: Insufficient documentation

## 2019-10-05 DIAGNOSIS — Z885 Allergy status to narcotic agent status: Secondary | ICD-10-CM | POA: Insufficient documentation

## 2019-10-05 DIAGNOSIS — Z9989 Dependence on other enabling machines and devices: Secondary | ICD-10-CM

## 2019-10-05 DIAGNOSIS — C3431 Malignant neoplasm of lower lobe, right bronchus or lung: Secondary | ICD-10-CM | POA: Diagnosis not present

## 2019-10-05 DIAGNOSIS — F0390 Unspecified dementia without behavioral disturbance: Secondary | ICD-10-CM | POA: Diagnosis not present

## 2019-10-05 DIAGNOSIS — I714 Abdominal aortic aneurysm, without rupture: Secondary | ICD-10-CM | POA: Diagnosis not present

## 2019-10-05 DIAGNOSIS — Z7982 Long term (current) use of aspirin: Secondary | ICD-10-CM | POA: Insufficient documentation

## 2019-10-05 DIAGNOSIS — Z87891 Personal history of nicotine dependence: Secondary | ICD-10-CM | POA: Insufficient documentation

## 2019-10-05 DIAGNOSIS — R413 Other amnesia: Secondary | ICD-10-CM | POA: Diagnosis present

## 2019-10-05 DIAGNOSIS — M1990 Unspecified osteoarthritis, unspecified site: Secondary | ICD-10-CM | POA: Insufficient documentation

## 2019-10-05 HISTORY — DX: Malignant (primary) neoplasm, unspecified: C80.1

## 2019-10-05 LAB — CBC
HCT: 36.8 % (ref 36.0–46.0)
Hemoglobin: 11.7 g/dL — ABNORMAL LOW (ref 12.0–15.0)
MCH: 28.8 pg (ref 26.0–34.0)
MCHC: 31.8 g/dL (ref 30.0–36.0)
MCV: 90.6 fL (ref 80.0–100.0)
Platelets: 355 10*3/uL (ref 150–400)
RBC: 4.06 MIL/uL (ref 3.87–5.11)
RDW: 14.3 % (ref 11.5–15.5)
WBC: 10.2 10*3/uL (ref 4.0–10.5)
nRBC: 0 % (ref 0.0–0.2)

## 2019-10-05 LAB — TROPONIN I (HIGH SENSITIVITY)
Troponin I (High Sensitivity): 19 ng/L — ABNORMAL HIGH (ref <18)
Troponin I (High Sensitivity): 21 ng/L — ABNORMAL HIGH (ref <18)
Troponin I (High Sensitivity): 21 ng/L — ABNORMAL HIGH (ref <18)
Troponin I (High Sensitivity): 18 ng/L — ABNORMAL HIGH (ref <18)

## 2019-10-05 LAB — BASIC METABOLIC PANEL
Anion gap: 10 (ref 5–15)
BUN: 21 mg/dL (ref 8–23)
CO2: 23 mmol/L (ref 22–32)
Calcium: 8.6 mg/dL — ABNORMAL LOW (ref 8.9–10.3)
Chloride: 102 mmol/L (ref 98–111)
Creatinine, Ser: 1.25 mg/dL — ABNORMAL HIGH (ref 0.44–1.00)
GFR calc Af Amer: 47 mL/min — ABNORMAL LOW (ref >60)
GFR calc non Af Amer: 41 mL/min — ABNORMAL LOW (ref >60)
Glucose, Bld: 98 mg/dL (ref 70–99)
Potassium: 4 mmol/L (ref 3.5–5.1)
Sodium: 135 mmol/L (ref 135–145)

## 2019-10-05 LAB — SARS CORONAVIRUS 2 (TAT 6-24 HRS): SARS Coronavirus 2: NEGATIVE

## 2019-10-05 MED ORDER — UMECLIDINIUM BROMIDE 62.5 MCG/INH IN AEPB
1.0000 | INHALATION_SPRAY | Freq: Every day | RESPIRATORY_TRACT | Status: DC
  Filled 2019-10-05: qty 7

## 2019-10-05 MED ORDER — ONDANSETRON HCL 4 MG/2ML IJ SOLN: 4.0000 mg | Freq: Four times a day (QID) | INTRAMUSCULAR | Status: DC | PRN

## 2019-10-05 MED ORDER — FLUOXETINE HCL 20 MG PO CAPS
20.0000 mg | ORAL_CAPSULE | Freq: Every day | ORAL | Status: DC
  Administered 2019-10-06: 20 mg via ORAL
  Filled 2019-10-05: qty 1

## 2019-10-05 MED ORDER — IOHEXOL 350 MG/ML SOLN
100.0000 mL | Freq: Once | INTRAVENOUS | Status: AC | PRN
  Administered 2019-10-05: 100 mL via INTRAVENOUS

## 2019-10-05 MED ORDER — NITROGLYCERIN 0.4 MG SL SUBL: 0.4000 mg | SUBLINGUAL_TABLET | SUBLINGUAL | Status: DC | PRN

## 2019-10-05 MED ORDER — ALUM & MAG HYDROXIDE-SIMETH 200-200-20 MG/5ML PO SUSP: 15.0000 mL | Freq: Four times a day (QID) | ORAL | Status: DC | PRN

## 2019-10-05 MED ORDER — ACETAMINOPHEN 500 MG PO TABS
1000.0000 mg | ORAL_TABLET | Freq: Once | ORAL | Status: AC
  Administered 2019-10-05: 1000 mg via ORAL
  Filled 2019-10-05: qty 2

## 2019-10-05 MED ORDER — ACETAMINOPHEN 325 MG PO TABS
650.0000 mg | ORAL_TABLET | ORAL | Status: DC | PRN
  Administered 2019-10-06: 650 mg via ORAL
  Filled 2019-10-05: qty 2

## 2019-10-05 MED ORDER — LEVOTHYROXINE SODIUM 100 MCG PO TABS
100.0000 ug | ORAL_TABLET | Freq: Every day | ORAL | Status: DC
  Administered 2019-10-06: 100 ug via ORAL
  Filled 2019-10-05: qty 1

## 2019-10-05 MED ORDER — ASPIRIN EC 81 MG PO TBEC
81.0000 mg | DELAYED_RELEASE_TABLET | Freq: Every day | ORAL | Status: DC
  Administered 2019-10-06: 81 mg via ORAL
  Filled 2019-10-05: qty 1

## 2019-10-05 MED ORDER — ALBUTEROL SULFATE (2.5 MG/3ML) 0.083% IN NEBU: 3.0000 mL | INHALATION_SOLUTION | Freq: Two times a day (BID) | RESPIRATORY_TRACT | Status: DC | PRN

## 2019-10-05 MED ORDER — ALBUTEROL SULFATE (2.5 MG/3ML) 0.083% IN NEBU: 3.0000 mL | INHALATION_SOLUTION | Freq: Four times a day (QID) | RESPIRATORY_TRACT | Status: DC | PRN

## 2019-10-05 MED ORDER — IRBESARTAN 75 MG PO TABS
75.0000 mg | ORAL_TABLET | Freq: Every day | ORAL | Status: DC
  Administered 2019-10-06: 75 mg via ORAL
  Filled 2019-10-05: qty 1

## 2019-10-05 MED ORDER — PANTOPRAZOLE SODIUM 40 MG IV SOLR
40.0000 mg | Freq: Two times a day (BID) | INTRAVENOUS | Status: DC
  Administered 2019-10-05: 40 mg via INTRAVENOUS
  Filled 2019-10-05: qty 40

## 2019-10-05 MED ORDER — LABETALOL HCL 5 MG/ML IV SOLN
10.0000 mg | Freq: Once | INTRAVENOUS | Status: AC
  Administered 2019-10-05: 10 mg via INTRAVENOUS
  Filled 2019-10-05: qty 4

## 2019-10-05 MED ORDER — GABAPENTIN 300 MG PO CAPS
600.0000 mg | ORAL_CAPSULE | Freq: Every day | ORAL | Status: DC
  Administered 2019-10-05: 600 mg via ORAL
  Filled 2019-10-05: qty 2

## 2019-10-05 MED ORDER — DIAZEPAM 5 MG PO TABS: 5.0000 mg | ORAL_TABLET | Freq: Two times a day (BID) | ORAL | Status: DC | PRN

## 2019-10-05 MED ORDER — HYDRALAZINE HCL 20 MG/ML IJ SOLN: 5.0000 mg | INTRAMUSCULAR | Status: DC | PRN

## 2019-10-05 MED ORDER — CALCIUM CARBONATE 1250 (500 CA) MG PO TABS
1250.0000 mg | ORAL_TABLET | Freq: Two times a day (BID) | ORAL | Status: DC
  Administered 2019-10-06 (×2): 1250 mg via ORAL
  Filled 2019-10-05 (×2): qty 1

## 2019-10-05 MED ORDER — PRAVASTATIN SODIUM 10 MG PO TABS
20.0000 mg | ORAL_TABLET | Freq: Every day | ORAL | Status: DC
  Administered 2019-10-05: 20 mg via ORAL
  Filled 2019-10-05: qty 2

## 2019-10-05 MED ORDER — VITAMIN B-12 1000 MCG PO TABS
1000.0000 ug | ORAL_TABLET | Freq: Every day | ORAL | Status: DC
  Administered 2019-10-06: 1000 ug via ORAL
  Filled 2019-10-05: qty 1

## 2019-10-05 NOTE — ED Provider Notes (Signed)
Care transferred to me.  Patient notes on and off chest pain for about a week or so but much worse since this morning.  Not too bad and essentially gone now.  First troponin is at 19.  CT angiography does not show an obvious dissection but does show an intramural hematoma. Discussed with vascular, Dr. Donzetta Matters. Hard to tell if acute or not. Will work to control BP, goal <099 systolic. Admit to hospitalist and he will consult. Will give IV labetalol. No chest pain right now.   CRITICAL CARE Performed by: Ephraim Hamburger   Total critical care time: 30 minutes  Critical care time was exclusive of separately billable procedures and treating other patients.  Critical care was necessary to treat or prevent imminent or life-threatening deterioration.  Critical care was time spent personally by me on the following activities: development of treatment plan with patient and/or surrogate as well as nursing, discussions with consultants, evaluation of patient's response to treatment, examination of patient, obtaining history from patient or surrogate, ordering and performing treatments and interventions, ordering and review of laboratory studies, ordering and review of radiographic studies, pulse oximetry and re-evaluation of patient's condition.    Sherwood Gambler, MD 10/05/19 [phone]

## 2019-10-05 NOTE — ED Triage Notes (Signed)
Arrived via EMS from home for chest pain and emesis x 2. EMS started an IV right AC 18G administered Zofran 4mg  IV, Nitro 2 tabs SL, Initial BP 200/100 after Nitro 148/83. On home oxygen 3L [place] 100%. Pain improved with nitro 3/10.

## 2019-10-05 NOTE — ED Provider Notes (Signed)
[facility] EMERGENCY DEPARTMENT Provider Note   CSN: [id number] Arrival date & time: 10/05/19  1317     History   Chief Complaint Chief Complaint  Patient presents with   Chest Pain    HPI Jill Bryant is a 78 y.o. female.     Pt presents to the ED today with cp.  The pt said pain started today.  She called EMS who gave her ASA and 3 nitro.  Pain is gone now.  Pt also has a hx of lung cancer which is currently getting treated with Keytruda.  The pt had a consultation with XRT on 12/3.  Radiation was not recommended as it was felt it would cause more sx than it would help.  Pt denies f/c.  No n/v.  She has normal DOE and wears 3L oxygen routinely.     Past Medical History:  Diagnosis Date   Anemia    Anxiety    Arthritis    Asthma    Back pain, chronic    Cancer (HCC)    large cell lung ca     CHF (congestive heart failure) (HCC)    COPD (chronic obstructive pulmonary disease) (HCC)    Depression    GERD (gastroesophageal reflux disease)    Headache    migraines   Hyperlipidemia    Hypertension    Hypothyroidism    Kidney atrophy    with cysts on right   Lumbar stenosis    Pneumonia    PONV (postoperative nausea and vomiting)    Retinal tear    right eye   Seizures (HCC)    with morphine   Shortness of breath dyspnea    with exertion   Sleep apnea    wears CPAP   TIA (transient ischemic attack)    UTI (lower urinary tract infection)    Wears glasses     Patient Active Problem List   Diagnosis Date Noted   Chest pain 10/05/2019   Hypertensive emergency 10/05/2019   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   Hypothyroidism 10/31/2018    Peripheral neuropathy 10/31/2018   Debility 10/31/2018   Pulmonary hypertension, unspecified ([place])    HTN (hypertension) 10/11/2018   Thoracic aortic aneurysm without rupture ([place]) 02/03/2018   GERD (gastroesophageal reflux disease) 04/27/2015   S/P AVR (aortic valve replacement) 02/23/2015   CAD (coronary artery disease) 02/23/2015   Carotid artery disease ([place]) 02/23/2015   COPD, severity to be determined ([place]) 02/23/2015   OSA on CPAP 02/23/2015    Past Surgical History:  Procedure Laterality Date   aortic endarterectomy     aortobifemoral bypass     BREAST SURGERY     cyst aspiration right breast   CARDIAC SURGERY     valve replacement 2013; aorta replacement 2002   CATARACT EXTRACTION W/ INTRAOCULAR LENS  IMPLANT, BILATERAL     CHOLECYSTECTOMY     COLONOSCOPY     HERNIA REPAIR     LUMBAR EPIDURAL INJECTION     LUMBAR LAMINECTOMY/DECOMPRESSION MICRODISCECTOMY Left 02/27/2016   Procedure: Laminectomy and Foraminotomy - Lumbar four-five -Lumbar three-four - left diskectomy Lumbar three-four;  Surgeon: Kary Kos, MD;  Location: [facility];  Service: Neurosurgery;  Laterality: Left;   MULTIPLE TOOTH EXTRACTIONS  PORTA CATH INSERTION  07/2019   TUBAL LIGATION       OB History   No obstetric history on file.      Home Medications    Prior to Admission medications   Medication Sig Start Date End Date Taking? Authorizing Provider  acetaminophen (TYLENOL) 500 MG tablet Take 1,000 mg by mouth every 6 (six) hours as needed (for pain or headaches).    Yes [provider]  albuterol (ACCUNEB) 0.63 MG/3ML nebulizer solution Take 1 ampule by nebulization 2 (two) times daily as needed for wheezing or shortness of breath (or congestion).    Yes [provider]  albuterol (PROVENTIL HFA;VENTOLIN HFA) 108 (90 Base) MCG/ACT inhaler Inhale 2 puffs into the lungs every 6 (six) hours as needed for wheezing or shortness of breath. 02/02/19  Yes  Vivi Barrack, MD  aspirin EC 81 MG tablet Take 81 mg by mouth daily.   Yes [provider]  calcium carbonate (CALCIUM 600) 600 MG TABS tablet Take 600 mg by mouth 2 (two) times daily with a meal.   Yes [provider]  cetirizine (ZYRTEC) 10 MG tablet Take 10 mg by mouth daily.    Yes [provider]  Cholecalciferol (VITAMIN D-3) 25 MCG (1000 UT) CAPS Take 2,000 Units by mouth daily.   Yes [provider]  conjugated estrogens (PREMARIN) vaginal cream Place 1 Applicatorful vaginally See admin instructions. Insert a pea-sized amount of cream into the vagina 2-3 times a week   Yes [provider]  diazepam (VALIUM) 5 MG tablet TAKE 1 TABLET BY MOUTH  TWICE DAILY AS NEEDED FOR  ANXIETY Patient taking differently: Take 5 mg by mouth every 12 (twelve) hours as needed for anxiety.  06/12/19  Yes Vivi Barrack, MD  diclofenac sodium (VOLTAREN) 1 % GEL Apply 4 g topically 4 (four) times daily. 10/14/18  Yes Mariel Aloe, MD  ferrous sulfate 325 (65 FE) MG tablet Take 325 mg by mouth daily with breakfast.   Yes [provider]  FLUoxetine (PROZAC) 20 MG capsule Take 1 capsule (20 mg total) by mouth daily. 02/19/19  Yes Vivi Barrack, MD  gabapentin (NEURONTIN) 300 MG capsule TAKE 2 CAPSULES BY MOUTH AT BEDTIME Patient taking differently: Take 600 mg by mouth at bedtime.  06/15/19  Yes Vivi Barrack, MD  levothyroxine (SYNTHROID) 100 MCG tablet Take 100 mcg by mouth daily before breakfast.  08/29/19  Yes [provider]  mometasone-formoterol (DULERA) 100-5 MCG/ACT AERO Inhale 2 puffs into the lungs 2 (two) times daily. 09/10/19 09/09/20 Yes [provider]  Omega-3 1000 MG CAPS Take 1,000 mg by mouth daily.    Yes [provider]  omeprazole (PRILOSEC) 20 MG capsule TAKE 1 CAPSULE BY MOUTH  DAILY BEFORE BREAKFAST Patient taking differently: Take 20 mg by mouth daily before breakfast.  06/15/19  Yes Vivi Barrack, MD    OXYGEN Inhale 2-4 L into the lungs at bedtime.   Yes [provider]  pravastatin (PRAVACHOL) 20 MG tablet TAKE 1 TABLET BY MOUTH AT  BEDTIME Patient taking differently: Take 20 mg by mouth at bedtime.  06/15/19  Yes Vivi Barrack, MD  prednisoLONE acetate (PRED FORTE) 1 % ophthalmic suspension Place 1 drop into the left eye 2 (two) times daily. 09/17/19  Yes [provider]  umeclidinium bromide (INCRUSE ELLIPTA) 62.5 MCG/INH AEPB Inhale 1 puff into the lungs daily. 02/19/19  Yes Vivi Barrack, MD  vitamin B-12 (CYANOCOBALAMIN)  1000 MCG tablet Take 1,000 mcg by mouth daily.   Yes [provider]  alum & mag hydroxide-simeth (MAALOX/MYLANTA) 200-200-20 MG/5ML suspension Take 15 mLs by mouth every 6 (six) hours as needed for indigestion or heartburn. 10/06/19   Aline August, MD  nitroGLYCERIN (NITROSTAT) 0.4 MG SL tablet Place 1 tablet (0.4 mg total) under the tongue every 5 (five) minutes. Patient taking differently: Place 0.4 mg under the tongue every 5 (five) minutes as needed for chest pain.  04/27/15   Hilty, Nadean Corwin, MD  pantoprazole (PROTONIX) 40 MG tablet Take 1 tablet (40 mg total) by mouth 2 (two) times daily before a meal. 10/06/19 11/05/19  Aline August, MD  predniSONE (DELTASONE) 50 MG tablet Take 1 tablet daily for 5 days. Patient not taking: Reported on 10/05/2019 09/02/19   Vivi Barrack, MD  verapamil (CALAN-SR) 120 MG CR tablet Take 1 tablet (120 mg total) by mouth daily. 10/07/19   Aline August, MD    Family History Family History  Problem Relation Age of Onset   Other Mother    Alzheimer's disease Father    Heart disease Maternal Grandmother    Heart disease Maternal Aunt     Social History Social History   Tobacco Use   Smoking status: Former Smoker    Quit date: 10/30/2003    Years since quitting: 15.9   Smokeless tobacco: Never Used  Substance Use Topics   Alcohol use: No   Drug use: No     Allergies   Tape,  Amlodipine, Metoprolol, and Morphine and related   Review of Systems Review of Systems  Cardiovascular: Positive for chest pain.  All other systems reviewed and are negative.    Physical Exam Updated Vital Signs BP (!) 149/75 (BP Location: Right Arm)    Pulse 69    Temp 98.1 F (36.7 C) (Oral)    Resp 20    Ht 5\' 7"  (1.702 m)    Wt 74.2 kg    SpO2 100%    BMI 25.61 kg/m   Physical Exam Vitals signs and nursing note reviewed.  Constitutional:      Appearance: She is well-developed.  HENT:     Head: Normocephalic and atraumatic.  Eyes:     Extraocular Movements: Extraocular movements intact.     Pupils: Pupils are equal, round, and reactive to light.  Neck:     Musculoskeletal: Normal range of motion and neck supple.  Cardiovascular:     Rate and Rhythm: Normal rate and regular rhythm.     Heart sounds: Normal heart sounds.  Pulmonary:     Effort: Pulmonary effort is normal.     Breath sounds: Normal breath sounds.  Abdominal:     General: Bowel sounds are normal.     Palpations: Abdomen is soft.  Musculoskeletal: Normal range of motion.  Skin:    General: Skin is warm and dry.     Capillary Refill: Capillary refill takes less than 2 seconds.  Neurological:     General: No focal deficit present.     Mental Status: She is alert and oriented to person, place, and time.      ED Treatments / Results  Labs (all labs ordered are listed, but only abnormal results are displayed) Labs Reviewed  BASIC METABOLIC PANEL - Abnormal; Notable for the following components:      Result Value   Creatinine, Ser 1.25 (*)    Calcium 8.6 (*)    GFR calc non Af Wyvonnia Lora  41 (*)    GFR calc Af Amer 47 (*)    All other components within normal limits  CBC - Abnormal; Notable for the following components:   Hemoglobin 11.7 (*)    All other components within normal limits  LIPID PANEL - Abnormal; Notable for the following components:   HDL 30 (*)    All other components within normal  limits  CBC WITH DIFFERENTIAL/PLATELET - Abnormal; Notable for the following components:   Hemoglobin 11.6 (*)    Abs Immature Granulocytes 0.10 (*)    All other components within normal limits  COMPREHENSIVE METABOLIC PANEL - Abnormal; Notable for the following components:   Creatinine, Ser 1.56 (*)    Calcium 8.4 (*)    Total Protein 5.7 (*)    Albumin 2.2 (*)    GFR calc non Af Amer 31 (*)    GFR calc Af Amer 36 (*)    All other components within normal limits  TROPONIN I (HIGH SENSITIVITY) - Abnormal; Notable for the following components:   Troponin I (High Sensitivity) 19 (*)    All other components within normal limits  TROPONIN I (HIGH SENSITIVITY) - Abnormal; Notable for the following components:   Troponin I (High Sensitivity) 21 (*)    All other components within normal limits  TROPONIN I (HIGH SENSITIVITY) - Abnormal; Notable for the following components:   Troponin I (High Sensitivity) 21 (*)    All other components within normal limits  TROPONIN I (HIGH SENSITIVITY) - Abnormal; Notable for the following components:   Troponin I (High Sensitivity) 18 (*)    All other components within normal limits  SARS CORONAVIRUS 2 (TAT 6-24 HRS)  MAGNESIUM    EKG EKG Interpretation  Date/Time:  Monday October 05 2019 13:30:38 EST Ventricular Rate:  82 PR Interval:    QRS Duration: 136 QT Interval:  437 QTC Calculation: 511 R Axis:   -77 Text Interpretation: Ectopic atrial rhythm Atrial premature complex RBBB and LAFB Probable lateral infarct, old No significant change since last tracing Confirmed by Isla Pence [phone]) on 10/05/2019 1:33:29 PM   Radiology Dg Chest 2 View  Result Date: 10/05/2019 CLINICAL DATA:  Chest pain EXAM: CHEST - 2 VIEW COMPARISON:  Chest pain FINDINGS: Right chest wall port catheter tip overlies the SVC. Patchy left basilar density. Opacity at the right lung base likely reflects known mass. No significant pleural effusion pneumothorax. Stable  heart size. Possible increased prominence of the descending thoracic aorta. IMPRESSION: Possible increased prominence of the descending thoracic aorta. CTA chest could be considered. Patchy left basilar atelectasis/consolidation. Right basilar opacity reflecting known mass. Electronically Signed   By: Macy Mis M.D.   On: 10/05/2019 14:19   Ct Angio Chest Pe W And/or Wo Contrast  Result Date: 10/05/2019 CLINICAL DATA:  History of right lung cancer with chest pain and shortness-of-breath. Suspect pulmonary embolism. EXAM: CT ANGIOGRAPHY CHEST, ABDOMEN AND PELVIS TECHNIQUE: Multidetector CT imaging through the chest, abdomen and pelvis was performed using the standard protocol during bolus administration of intravenous contrast. Multiplanar reconstructed images and MIPs were obtained and reviewed to evaluate the vascular anatomy. CONTRAST:  18mL OMNIPAQUE IOHEXOL 350 MG/ML SOLN COMPARISON:  PET-CT 09/17/2019 and chest CT 05/05/2019 FINDINGS: CTA CHEST FINDINGS Cardiovascular: Cardiomegaly with calcification of the mitral valve annulus. Previous median sternotomy. 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 Prosthetic aortic valve is present. Normal takeoff of the great vessels from the aortic arch. Beginning distal to  the takeoff of the left subclavian artery is wall thickening of the aorta measuring 7 mm over the distal arch which is low-attenuation suggesting subacute to chronic intramural hematoma. There is moderate regularity of the lumen of the aorta over this region. These findings are more pronounced compared to May 07, 2019. This becomes less pronounced moving more distally in the descending thoracic aorta. No evidence of aortic dissection flap. Pulmonary arterial system is well opacified without evidence of emboli. Remaining vascular structures are unremarkable. Mediastinum/Nodes: Worsening mediastinal adenopathy with 1.5 cm right paratracheal lymph  node (previously 1.3 cm). Moderate subcarinal adenopathy measuring 1.7 cm by short axis (previously 0.4 cm). 1 cm AP window lymph node. 1.2 cm right hilar lymph node. Moderate size hiatal hernia. Moderate size hiatal hernia. Remaining mediastinal structures are unchanged. Lungs/Pleura: Lungs are adequately inflated with moderate centrilobular emphysematous disease. Evidence of patient's known lung cancer over the right lower lobe measuring 3.6 x 3.9 cm (previously 3.2 x 4 cm) slightly larger nodule over the right middle lobe measuring 1.1 cm (previously 9 mm). Slightly larger nodule over the subpleural location of the posterolateral right lower lobe just above patient's known lung cancer measuring 2 cm in diameter (previously 2.6 cm). No new nodules identified. Small amount of left pleural fluid which is worse. Associated atelectatic change over the left base. Musculoskeletal: Degenerative change of the spine. Review of the MIP images confirms the above findings. CTA ABDOMEN AND PELVIS FINDINGS VASCULAR Aorta: Calcified plaque over the proximal to mid abdominal aorta. Mild aneurysmal dilatation of the proximal aorta at the level diaphragm measuring 4 cm in AP diameter. Irregularity of the past 5 lumen of the proximal to mid aorta. Patent aortoiliac graft beginning below the renal arteries. Celiac: Patent. SMA: Patent. Renals: Patent left renal artery. 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 IMA: Patent. Inflow: Bilateral iliac artery graft which is patent to level of the femoral arteries. Veins: Unremarkable. Review of the MIP images confirms the above findings. NON-VASCULAR Hepatobiliary: Mild nodular contour of the liver. Previous cholecystectomy. Biliary tree is normal. Pancreas: Normal. Spleen: Normal. Adrenals/Urinary Tract: Adrenal glands are normal. Left kidney is within normal. Atrophic right kidney mild cystic change which is stable. Ureters  unremarkable. Bladder is unremarkable. Stomach/Bowel: Moderate size hiatal hernia as the stomach is otherwise unremarkable. Small bowel is normal. Appendix is normal. Minimal diverticulosis of the colon. Lymphatic: No significant adenopathy. Reproductive: Normal. Other: No free fluid or focal inflammatory change. Evidence of previous midline ventral hernia repair. Musculoskeletal: Degenerative change of the spine. Review of the MIP images confirms the above findings. IMPRESSION: 1.  No evidence of pulmonary embolism. 2. New findings compared to 05/07/19 and likely new compared to the noncontrast PET-CT 09/17/2019 compatible with subacute to chronic intramural hematoma beginning just distal to the takeoff of the left subclavian artery and extending into the distal descending thoracic aorta. Intramural hematoma measures 7 mm in thickness over the distal arch. No evidence of dissection flap. 3. 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 Recommend annual imaging followup by CTA or MRA. This recommendation follows 2010 ACCF/AHA/AATS/ACR/ASA/SCA/SCAI/SIR/STS/SVM Guidelines for the Diagnosis and Management of Patients with Thoracic Aortic Disease. Circulation. 2010; 121: D638-V564. Aortic aneurysm NOS (ICD10-I71.9). Aortic Atherosclerosis (ICD10-I70.0). Aortic aneurysm NOS (ICD10-I71.9). 4. Slight interval worsening of known right lower lobe lung cancer measuring 3.6 x 3.9 cm with suggestion of slight interval worsening 1.1 cm right middle lobe nodule and 2 cm  peripheral right lower lobe nodule. No new nodules identified. New small amount left pleural fluid. Minimal worsening mediastinal and right hilar adenopathy. No evidence of metastatic disease within the abdomen/pelvis. 5.  Emphysema (ICD10-J43.9). 6. 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 Patent aortoiliac graft. No evidence of aortic  dissection within the abdominal aorta. 7. 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 8. Colonic diverticulosis. Previous midline ventral hernia repair. Nodular contour to the liver which may be due to a degree of cirrhosis. Hiatal hernia. These results were called by telephone at the time of interpretation on 10/05/2019 at 4:58 pm to provider Dr. Sherwood Gambler, who verbally acknowledged these results. Electronically Signed   By: Marin Olp M.D.   On: 10/05/2019 17:03   Ct Angio Chest/abd/pel For Dissection W And/or W/wo  Result Date: 10/05/2019 CLINICAL DATA:  History of right lung cancer with chest pain and shortness-of-breath. Suspect pulmonary embolism. EXAM: CT ANGIOGRAPHY CHEST, ABDOMEN AND PELVIS TECHNIQUE: Multidetector CT imaging through the chest, abdomen and pelvis was performed using the standard protocol during bolus administration of intravenous contrast. Multiplanar reconstructed images and MIPs were obtained and reviewed to evaluate the vascular anatomy. CONTRAST:  129mL OMNIPAQUE IOHEXOL 350 MG/ML SOLN COMPARISON:  PET-CT 09/17/2019 and chest CT 06/04/19 FINDINGS: CTA CHEST FINDINGS Cardiovascular: Cardiomegaly with calcification of the mitral valve annulus. Previous median sternotomy. 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 Prosthetic aortic valve is present. Normal takeoff of the great vessels from the aortic arch. Beginning distal to the takeoff of the left subclavian artery is wall thickening of the aorta measuring 7 mm over the distal arch which is low-attenuation suggesting subacute to chronic intramural hematoma. There is moderate regularity of the lumen of the aorta over this region. These findings are more pronounced compared to 06/04/2019. This becomes less pronounced moving more distally in the descending thoracic aorta. No evidence of aortic dissection flap. Pulmonary arterial system  is well opacified without evidence of emboli. Remaining vascular structures are unremarkable. Mediastinum/Nodes: Worsening mediastinal adenopathy with 1.5 cm right paratracheal lymph node (previously 1.3 cm). Moderate subcarinal adenopathy measuring 1.7 cm by short axis (previously 0.4 cm). 1 cm AP window lymph node. 1.2 cm right hilar lymph node. Moderate size hiatal hernia. Moderate size hiatal hernia. Remaining mediastinal structures are unchanged. Lungs/Pleura: Lungs are adequately inflated with moderate centrilobular emphysematous disease. Evidence of patient's known lung cancer over the right lower lobe measuring 3.6 x 3.9 cm (previously 3.2 x 4 cm) slightly larger nodule over the right middle lobe measuring 1.1 cm (previously 9 mm). Slightly larger nodule over the subpleural location of the posterolateral right lower lobe just above patient's known lung cancer measuring 2 cm in diameter (previously 2.6 cm). No new nodules identified. Small amount of left pleural fluid which is worse. Associated atelectatic change over the left base. Musculoskeletal: Degenerative change of the spine. Review of the MIP images confirms the above findings. CTA ABDOMEN AND PELVIS FINDINGS VASCULAR Aorta: Calcified plaque over the proximal to mid abdominal aorta. Mild aneurysmal dilatation of the proximal aorta at the level diaphragm measuring 4 cm in AP diameter. Irregularity of the past 5 lumen of the proximal to mid aorta. Patent aortoiliac graft beginning below the renal arteries. Celiac: Patent. SMA: Patent. Renals: Patent left renal artery. 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 IMA: Patent. Inflow:  Bilateral iliac artery graft which is patent to level of the femoral arteries. Veins: Unremarkable. Review of the MIP images confirms the above findings. NON-VASCULAR Hepatobiliary: Mild nodular contour of the liver. Previous cholecystectomy. Biliary tree is normal.  Pancreas: Normal. Spleen: Normal. Adrenals/Urinary Tract: Adrenal glands are normal. Left kidney is within normal. Atrophic right kidney mild cystic change which is stable. Ureters unremarkable. Bladder is unremarkable. Stomach/Bowel: Moderate size hiatal hernia as the stomach is otherwise unremarkable. Small bowel is normal. Appendix is normal. Minimal diverticulosis of the colon. Lymphatic: No significant adenopathy. Reproductive: Normal. Other: No free fluid or focal inflammatory change. Evidence of previous midline ventral hernia repair. Musculoskeletal: Degenerative change of the spine. Review of the MIP images confirms the above findings. IMPRESSION: 1.  No evidence of pulmonary embolism. 2. 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 Intramural hematoma measures 7 mm in thickness over the distal arch. No evidence of dissection flap. 3. 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 Recommend annual imaging followup by CTA or MRA. This recommendation follows 2010 ACCF/AHA/AATS/ACR/ASA/SCA/SCAI/SIR/STS/SVM Guidelines for the Diagnosis and Management of Patients with Thoracic Aortic Disease. Circulation. 2010; 121: W979-G921. Aortic aneurysm NOS (ICD10-I71.9). Aortic Atherosclerosis (ICD10-I70.0). Aortic aneurysm NOS (ICD10-I71.9). 4. Slight interval worsening of known right lower lobe lung cancer measuring 3.6 x 3.9 cm with suggestion of slight interval worsening 1.1 cm right middle lobe nodule and 2 cm peripheral right lower lobe nodule. No new nodules identified. New small amount left pleural fluid. Minimal worsening mediastinal and right hilar adenopathy. No evidence of metastatic disease within the abdomen/pelvis. 5.  Emphysema  (ICD10-J43.9). 6. 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 Patent aortoiliac graft. No evidence of aortic dissection within the abdominal aorta. 7. 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 8. Colonic diverticulosis. Previous midline ventral hernia repair. Nodular contour to the liver which may be due to a degree of cirrhosis. Hiatal hernia. These results were called by telephone at the time of interpretation on 10/05/2019 at 4:58 pm to provider Dr. Sherwood Gambler, who verbally acknowledged these results. Electronically Signed   By: Marin Olp M.D.   On: 10/05/2019 17:03    Procedures Procedures (including critical care time)  Medications Ordered in ED Medications  aspirin EC tablet 81 mg (81 mg Oral Given 10/06/19 1045)  nitroGLYCERIN (NITROSTAT) SL tablet 0.4 mg (has no administration in time range)  pravastatin (PRAVACHOL) tablet 20 mg (20 mg Oral Given 10/05/19 2113)  diazepam (VALIUM) tablet 5 mg (has no administration in time range)  FLUoxetine (PROZAC) capsule 20 mg (20 mg Oral Given 10/06/19 1045)  levothyroxine (SYNTHROID) tablet 100 mcg (100 mcg Oral Given 10/06/19 0527)  vitamin B-12 (CYANOCOBALAMIN) tablet 1,000 mcg (1,000 mcg Oral Given 10/06/19 1044)  gabapentin (NEURONTIN) capsule 600 mg (600 mg Oral Given 10/05/19 2113)  calcium carbonate (OS-CAL - dosed in mg of elemental calcium) tablet 1,250 mg (1,250 mg Oral Given 10/06/19 1044)  albuterol (PROVENTIL) (2.5 MG/3ML) 0.083% nebulizer solution 3 mL (has no administration in time range)  albuterol (PROVENTIL) (2.5 MG/3ML) 0.083% nebulizer solution 3 mL (has no administration in time range)  umeclidinium bromide (INCRUSE ELLIPTA) 62.5 MCG/INH 1 puff (1 puff Inhalation Not Given 10/06/19 1436)  acetaminophen (TYLENOL) tablet 650 mg (650 mg Oral Given 10/06/19 1249)  ondansetron (ZOFRAN) injection 4 mg (has no administration in  time range)  alum & mag hydroxide-simeth (MAALOX/MYLANTA) 200-200-20 MG/5ML suspension 15 mL (has no administration in time range)  0.9 %  sodium chloride infusion ( Intravenous New Bag/Given 10/06/19 1234)  pantoprazole (PROTONIX) EC tablet 40 mg (has no administration in time range)  feeding supplement (ENSURE ENLIVE) (ENSURE ENLIVE) liquid 237 mL (has no administration in time range)  verapamil (CALAN-SR) CR tablet 120 mg (has no administration in time range)  acetaminophen (TYLENOL) tablet 1,000 mg (1,000 mg Oral Given 10/05/19 1759)  iohexol (OMNIPAQUE) 350 MG/ML injection 100 mL (100 mLs Intravenous Contrast Given 10/05/19 1607)  labetalol (NORMODYNE) injection 10 mg (10 mg Intravenous Given 10/05/19 1750)     Initial Impression / Assessment and Plan / ED Course  I have reviewed the triage vital signs and the nursing notes.  Pertinent labs & imaging results that were available during my care of the patient were reviewed by me and considered in my medical decision making (see chart for details).       Pt signed out to Dr. Regenia Skeeter pending CT results.  I anticipate pt will need admission.   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[place])  Malignant neoplasm of lower lobe of right lung [facility])    ED Discharge Orders         Ordered    verapamil (CALAN-SR) 120 MG CR tablet  Daily     10/06/19 1554    pantoprazole (PROTONIX) 40 MG tablet  2 times daily before meals     10/06/19 1554    Increase activity slowly     10/06/19 1554    Diet - low sodium heart healthy     10/06/19 1554    Ambulatory referral to Cardiology     10/06/19 1554    Ambulatory referral to Vascular Surgery     10/06/19 1554    alum & mag hydroxide-simeth (MAALOX/MYLANTA) 200-200-20 MG/5ML suspension  Every 6 hours PRN     10/06/19 1554           Isla Pence, MD 10/06/19 1604

## 2019-10-05 NOTE — H&P (Signed)
History and Physical    Jill Bryant DTO:[id number] DOB: 02-Mar-1941 DOA: 10/05/2019  PCP: Vivi Barrack, MD   Patient coming from: Home  I have personally briefly reviewed patient's old medical records in [place]  Chief Complaint: Chest pain  HPI: Jill Bryant is a 78 y.o. female with medical history significant of non-small cell lung cancer stage IV on Keytruda, hypertension, hyperlipidemia, hypothyroidism, OSA on CPAP, depression and anxiety, memory loss, peripheral neuropathy, CAD status post cardiac surgery and valve replacement along with history of aortobifemoral bypass presented with chest pain.  Patient states that she has been having on and off chest pain for the last 5-7 days, intermittent, pressure-like, radiating to the back, 5-7 out of 10 in intensity associated with some nausea but with no aggravating or relieving factors.  She wears oxygen via nasal cannula at 3 L/min all the time and complains of intermittent worsening shortness of breath as well.  Patient denies any trauma to chest, abdominal pain, diarrhea, dysuria, fever, loss of consciousness, seizures.  Patient has history of memory loss and is a poor historian.  Son is present at bedside as well.  She presented to the ED today because of worsening chest pain.  ED Course: EMS gave her aspirin and nitroglycerin.  Subsequently, pain subsided.  Initial troponin was 19.  CT angiography did not show an obvious dissection but did show intramural aortic hematoma.  Vascular surgery was consulted who recommended better blood pressure control.  Apparently, initial blood pressure was in the 200s.  Hospital service was called to evaluate the patient.  Review of Systems: Could not be appropriately reviewed because of patient being a poor historian probably secondary to her memory loss/dementia  Past Medical History:  Diagnosis Date   Anemia    Anxiety    Arthritis    Asthma    Back pain, chronic    CHF (congestive  heart failure) (HCC)    COPD (chronic obstructive pulmonary disease) (HCC)    Depression    GERD (gastroesophageal reflux disease)    Headache    migraines   Hyperlipidemia    Hypertension    Hypothyroidism    Kidney atrophy    with cysts on right   Lumbar stenosis    Pneumonia    PONV (postoperative nausea and vomiting)    Retinal tear    right eye   Seizures (HCC)    with morphine   Shortness of breath dyspnea    with exertion   Sleep apnea    wears CPAP   TIA (transient ischemic attack)    UTI (lower urinary tract infection)    Wears glasses     Past Surgical History:  Procedure Laterality Date   aortic endarterectomy     aortobifemoral bypass     BREAST SURGERY     cyst aspiration right breast   CARDIAC SURGERY     valve replacement 2013; aorta replacement 2002   CATARACT EXTRACTION W/ INTRAOCULAR LENS  IMPLANT, BILATERAL     CHOLECYSTECTOMY     COLONOSCOPY     HERNIA REPAIR     LUMBAR EPIDURAL INJECTION     LUMBAR LAMINECTOMY/DECOMPRESSION MICRODISCECTOMY Left 02/27/2016   Procedure: Laminectomy and Foraminotomy - Lumbar four-five -Lumbar three-four - left diskectomy Lumbar three-four;  Surgeon: Kary Kos, MD;  Location: [facility];  Service: Neurosurgery;  Laterality: Left;   MULTIPLE TOOTH EXTRACTIONS     TUBAL LIGATION     Social history  reports that she quit  smoking about 15 years ago. She has never used smokeless tobacco. She reports that she does not drink alcohol or use drugs.  Allergies  Allergen Reactions   Tape Rash and Other (See Comments)    SKIN IS VERY THIN; TAPE TEARS THE SKIN!!   Amlodipine Swelling and Other (See Comments)    Feet/toes swelling   Metoprolol Other (See Comments)    Bradycardia and fatigue   Morphine And Related Other (See Comments)    Hallucinations/seizures    Family History  Problem Relation Age of Onset   Other Mother    Alzheimer's disease Father    Heart disease Maternal  Grandmother    Heart disease Maternal Aunt     Prior to Admission medications   Medication Sig Start Date End Date Taking? Authorizing Provider  acetaminophen (TYLENOL) 500 MG tablet Take 1,000 mg by mouth every 6 (six) hours as needed (for pain or headaches).     [provider]  albuterol (ACCUNEB) 0.63 MG/3ML nebulizer solution Take 1 ampule by nebulization 2 (two) times daily as needed for wheezing or shortness of breath (or congestion).     [provider]  albuterol (PROVENTIL HFA;VENTOLIN HFA) 108 (90 Base) MCG/ACT inhaler Inhale 2 puffs into the lungs every 6 (six) hours as needed for wheezing or shortness of breath. 02/02/19   Vivi Barrack, MD  aspirin EC 81 MG tablet Take 81 mg by mouth daily.    [provider]  calcium carbonate (CALCIUM 600) 600 MG TABS tablet Take 600 mg by mouth 2 (two) times daily with a meal.    [provider]  cetirizine (ZYRTEC) 10 MG tablet Take 10 mg by mouth daily.     [provider]  Cholecalciferol (VITAMIN D-3) 25 MCG (1000 UT) CAPS Take 2,000 Units by mouth daily.    [provider]  conjugated estrogens (PREMARIN) vaginal cream Place 1 Applicatorful vaginally See admin instructions. Insert a pea-sized amount of cream into the vagina 2-3 times a week    [provider]  diazepam (VALIUM) 5 MG tablet TAKE 1 TABLET BY MOUTH  TWICE DAILY AS NEEDED FOR  ANXIETY 06/12/19   Vivi Barrack, MD  diclofenac sodium (VOLTAREN) 1 % GEL Apply 4 g topically 4 (four) times daily. 10/14/18   Mariel Aloe, MD  FLUoxetine (PROZAC) 20 MG capsule Take 1 capsule (20 mg total) by mouth daily. 02/19/19   Vivi Barrack, MD  gabapentin (NEURONTIN) 300 MG capsule TAKE 2 CAPSULES BY MOUTH AT BEDTIME Patient taking differently: 600 mg.  06/15/19   Vivi Barrack, MD  gabapentin (NEURONTIN) 600 MG tablet  07/17/19   [provider]  levothyroxine (SYNTHROID) 100 MCG tablet Take by mouth. 08/29/19    [provider]  nitroGLYCERIN (NITROSTAT) 0.4 MG SL tablet Place 1 tablet (0.4 mg total) under the tongue every 5 (five) minutes. Patient taking differently: Place 0.4 mg under the tongue every 5 (five) minutes as needed for chest pain.  04/27/15   Hilty, Nadean Corwin, MD  Omega-3 1000 MG CAPS Take 1,000 mg by mouth daily.     [provider]  omeprazole (PRILOSEC) 20 MG capsule TAKE 1 CAPSULE BY MOUTH  DAILY BEFORE BREAKFAST 06/15/19   Vivi Barrack, MD  OXYGEN Inhale 2-4 L into the lungs at bedtime.    [provider]  pravastatin (PRAVACHOL) 20 MG tablet TAKE 1 TABLET BY MOUTH AT  BEDTIME 06/15/19   Vivi Barrack, MD  predniSONE (DELTASONE) 50 MG tablet Take 1 tablet daily for 5 days. 09/02/19   Vivi Barrack, MD  umeclidinium bromide (INCRUSE ELLIPTA) 62.5 MCG/INH AEPB Inhale 1 puff into the lungs daily. 02/19/19   Vivi Barrack, MD  vitamin B-12 (CYANOCOBALAMIN) 1000 MCG tablet Take 1,000 mcg by mouth daily.    [provider]    Physical Exam: Vitals:   10/05/19 1720 10/05/19 1730 10/05/19 1736 10/05/19 1800  BP:  (!) 151/85  (!) 111/98  Pulse: 63 (!) 44 71 61  Resp: 18 17 16  (!) 22  Temp:      TempSrc:      SpO2: 100% 99% 100% 99%  Weight:      Height:        Constitutional: NAD, calm, comfortable.  Elderly female lying in bed. Vitals:   10/05/19 1720 10/05/19 1730 10/05/19 1736 10/05/19 1800  BP:  (!) 151/85  (!) 111/98  Pulse: 63 (!) 44 71 61  Resp: 18 17 16  (!) 22  Temp:      TempSrc:      SpO2: 100% 99% 100% 99%  Weight:      Height:       Eyes: PERRL, lids and conjunctivae normal ENMT: Mucous membranes are moist. Posterior pharynx clear of any exudate or lesions. Neck: normal, supple, no masses, no thyromegaly Respiratory: bilateral decreased breath sounds at bases, no wheezing; some scattered crackles.  Normal respiratory effort. No accessory muscle use.  Cardiovascular: S1 S2 positive, rate controlled. No extremity edema. 2+  pedal pulses.  Abdomen: no tenderness, no masses palpated. No hepatosplenomegaly. Bowel sounds positive.  Musculoskeletal: no clubbing / cyanosis. No joint deformity upper and lower extremities.  Skin: no rashes, lesions, ulcers. No induration Neurologic: CN 2-12 grossly intact. Moving extremities. No focal neurologic deficits.  Alert awake but poor historian and is intermittently forgetful. Psychiatric: Could not be evaluated appropriately because of patient being a poor historian.   Labs on Admission: I have personally reviewed following labs and imaging studies  CBC: Recent Labs  Lab 10/05/19 1342  WBC 10.2  HGB 11.7*  HCT 36.8  MCV 90.6  PLT 951   Basic Metabolic Panel: Recent Labs  Lab 10/05/19 1342  NA 135  K 4.0  CL 102  CO2 23  GLUCOSE 98  BUN 21  CREATININE 1.25*  CALCIUM 8.6*   GFR: Estimated Creatinine Clearance: 38.8 mL/min (A) (by C-G formula based on SCr of 1.25 mg/dL (H)). Liver Function Tests: No results for input(s): AST, ALT, ALKPHOS, BILITOT, PROT, ALBUMIN in the last 168 hours. No results for input(s): LIPASE, AMYLASE in the last 168 hours. No results for input(s): AMMONIA in the last 168 hours. Coagulation Profile: No results for input(s): INR, PROTIME in the last 168 hours. Cardiac Enzymes: No results for input(s): CKTOTAL, CKMB, CKMBINDEX, TROPONINI in the last 168 hours. BNP (last 3 results) No results for input(s): PROBNP in the last 8760 hours. HbA1C: No results for input(s): HGBA1C in the last 72 hours. CBG: No results for input(s): GLUCAP in the last 168 hours. Lipid Profile: No results for input(s): CHOL, HDL, LDLCALC, TRIG, CHOLHDL, LDLDIRECT in the last 72 hours. Thyroid Function Tests: No results for input(s): TSH, T4TOTAL, FREET4, T3FREE, THYROIDAB in the last 72 hours. Anemia Panel: No results for input(s): VITAMINB12, FOLATE, FERRITIN, TIBC, IRON, RETICCTPCT in the last 72 hours. Urine analysis:    Component Value Date/Time    COLORURINE YELLOW 10/12/2018 [place] 10/12/2018 1029  LABSPEC 1.005 10/12/2018 1029   PHURINE 6.0 10/12/2018 1029   GLUCOSEU NEGATIVE 10/12/2018 1029   HGBUR NEGATIVE 10/12/2018 [place] 10/12/2018 [place] 10/12/2018 1029   PROTEINUR NEGATIVE 10/12/2018 1029   UROBILINOGEN 1.0 08/17/2015 1206   NITRITE NEGATIVE 10/12/2018 1029   LEUKOCYTESUR TRACE (A) 10/12/2018 1029    Radiological Exams on Admission: Dg Chest 2 View  Result Date: 10/05/2019 CLINICAL DATA:  Chest pain EXAM: CHEST - 2 VIEW COMPARISON:  Chest pain FINDINGS: Right chest wall port catheter tip overlies the SVC. Patchy left basilar density. Opacity at the right lung base likely reflects known mass. No significant pleural effusion pneumothorax. Stable heart size. Possible increased prominence of the descending thoracic aorta. IMPRESSION: Possible increased prominence of the descending thoracic aorta. CTA chest could be considered. Patchy left basilar atelectasis/consolidation. Right basilar opacity reflecting known mass. Electronically Signed   By: Macy Mis M.D.   On: 10/05/2019 14:19   Ct Angio Chest Pe W And/or Wo Contrast  Result Date: 10/05/2019 CLINICAL DATA:  History of right lung cancer with chest pain and shortness-of-breath. Suspect pulmonary embolism. EXAM: CT ANGIOGRAPHY CHEST, ABDOMEN AND PELVIS TECHNIQUE: Multidetector CT imaging through the chest, abdomen and pelvis was performed using the standard protocol during bolus administration of intravenous contrast. Multiplanar reconstructed images and MIPs were obtained and reviewed to evaluate the vascular anatomy. CONTRAST:  180mL OMNIPAQUE IOHEXOL 350 MG/ML SOLN COMPARISON:  PET-CT 09/17/2019 and chest CT 2019/05/12 FINDINGS: CTA CHEST FINDINGS Cardiovascular: Cardiomegaly with calcification of the mitral valve annulus. Previous median sternotomy. Stable aneurysmal dilatation of the ascending thoracic aorta  measuring 4.1 cm in AP diameter. Prosthetic aortic valve is present. Normal takeoff of the great vessels from the aortic arch. Beginning distal to the takeoff of the left subclavian artery is wall thickening of the aorta measuring 7 mm over the distal arch which is low-attenuation suggesting subacute to chronic intramural hematoma. There is moderate regularity of the lumen of the aorta over this region. These findings are more pronounced compared to 05-12-2019. This becomes less pronounced moving more distally in the descending thoracic aorta. No evidence of aortic dissection flap. Pulmonary arterial system is well opacified without evidence of emboli. Remaining vascular structures are unremarkable. Mediastinum/Nodes: Worsening mediastinal adenopathy with 1.5 cm right paratracheal lymph node (previously 1.3 cm). Moderate subcarinal adenopathy measuring 1.7 cm by short axis (previously 0.4 cm). 1 cm AP window lymph node. 1.2 cm right hilar lymph node. Moderate size hiatal hernia. Moderate size hiatal hernia. Remaining mediastinal structures are unchanged. Lungs/Pleura: Lungs are adequately inflated with moderate centrilobular emphysematous disease. Evidence of patient's known lung cancer over the right lower lobe measuring 3.6 x 3.9 cm (previously 3.2 x 4 cm) slightly larger nodule over the right middle lobe measuring 1.1 cm (previously 9 mm). Slightly larger nodule over the subpleural location of the posterolateral right lower lobe just above patient's known lung cancer measuring 2 cm in diameter (previously 2.6 cm). No new nodules identified. Small amount of left pleural fluid which is worse. Associated atelectatic change over the left base. Musculoskeletal: Degenerative change of the spine. Review of the MIP images confirms the above findings. CTA ABDOMEN AND PELVIS FINDINGS VASCULAR Aorta: Calcified plaque over the proximal to mid abdominal aorta. Mild aneurysmal dilatation of the proximal aorta at the level  diaphragm measuring 4 cm in AP diameter. Irregularity of the past 5 lumen of the proximal to mid aorta. Patent aortoiliac graft beginning below the renal arteries. Celiac:  Patent. SMA: Patent. Renals: Patent left renal artery. 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 IMA: Patent. Inflow: Bilateral iliac artery graft which is patent to level of the femoral arteries. Veins: Unremarkable. Review of the MIP images confirms the above findings. NON-VASCULAR Hepatobiliary: Mild nodular contour of the liver. Previous cholecystectomy. Biliary tree is normal. Pancreas: Normal. Spleen: Normal. Adrenals/Urinary Tract: Adrenal glands are normal. Left kidney is within normal. Atrophic right kidney mild cystic change which is stable. Ureters unremarkable. Bladder is unremarkable. Stomach/Bowel: Moderate size hiatal hernia as the stomach is otherwise unremarkable. Small bowel is normal. Appendix is normal. Minimal diverticulosis of the colon. Lymphatic: No significant adenopathy. Reproductive: Normal. Other: No free fluid or focal inflammatory change. Evidence of previous midline ventral hernia repair. Musculoskeletal: Degenerative change of the spine. Review of the MIP images confirms the above findings. IMPRESSION: 1.  No evidence of pulmonary embolism. 2. 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 Intramural hematoma measures 7 mm in thickness over the distal arch. No evidence of dissection flap. 3. 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 Recommend annual imaging followup by CTA or MRA. This recommendation follows 2010 ACCF/AHA/AATS/ACR/ASA/SCA/SCAI/SIR/STS/SVM Guidelines for the  Diagnosis and Management of Patients with Thoracic Aortic Disease. Circulation. 2010; 121: I144-R154. Aortic aneurysm NOS (ICD10-I71.9). Aortic Atherosclerosis (ICD10-I70.0). Aortic aneurysm NOS (ICD10-I71.9). 4. Slight interval worsening of known right lower lobe lung cancer measuring 3.6 x 3.9 cm with suggestion of slight interval worsening 1.1 cm right middle lobe nodule and 2 cm peripheral right lower lobe nodule. No new nodules identified. New small amount left pleural fluid. Minimal worsening mediastinal and right hilar adenopathy. No evidence of metastatic disease within the abdomen/pelvis. 5.  Emphysema (ICD10-J43.9). 6. 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 Patent aortoiliac graft. No evidence of aortic dissection within the abdominal aorta. 7. 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 8. Colonic diverticulosis. Previous midline ventral hernia repair. Nodular contour to the liver which may be due to a degree of cirrhosis. Hiatal hernia. These results were called by telephone at the time of interpretation on 10/05/2019 at 4:58 pm to provider Dr. Sherwood Gambler, who verbally acknowledged these results. Electronically Signed   By: Marin Olp M.D.   On: 10/05/2019 17:03   Ct Angio Chest/abd/pel For Dissection W And/or W/wo  Result Date: 10/05/2019 CLINICAL DATA:  History of right lung cancer with chest pain and shortness-of-breath. Suspect pulmonary embolism. EXAM: CT ANGIOGRAPHY CHEST, ABDOMEN AND PELVIS TECHNIQUE: Multidetector CT imaging through the chest, abdomen and pelvis was performed using the standard protocol during bolus administration of intravenous contrast. Multiplanar reconstructed images and MIPs were obtained and reviewed to evaluate the vascular anatomy. CONTRAST:  120mL OMNIPAQUE IOHEXOL 350 MG/ML SOLN COMPARISON:  PET-CT 09/17/2019 and chest CT 05/05/2019 FINDINGS: CTA  CHEST FINDINGS Cardiovascular: Cardiomegaly with calcification of the mitral valve annulus. Previous median sternotomy. 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 Prosthetic aortic valve is present. Normal takeoff of the great vessels from the aortic arch. Beginning distal to the takeoff of the left subclavian artery is wall thickening of the aorta measuring 7 mm over the  distal arch which is low-attenuation suggesting subacute to chronic intramural hematoma. There is moderate regularity of the lumen of the aorta over this region. These findings are more pronounced compared to 05-12-19. This becomes less pronounced moving more distally in the descending thoracic aorta. No evidence of aortic dissection flap. Pulmonary arterial system is well opacified without evidence of emboli. Remaining vascular structures are unremarkable. Mediastinum/Nodes: Worsening mediastinal adenopathy with 1.5 cm right paratracheal lymph node (previously 1.3 cm). Moderate subcarinal adenopathy measuring 1.7 cm by short axis (previously 0.4 cm). 1 cm AP window lymph node. 1.2 cm right hilar lymph node. Moderate size hiatal hernia. Moderate size hiatal hernia. Remaining mediastinal structures are unchanged. Lungs/Pleura: Lungs are adequately inflated with moderate centrilobular emphysematous disease. Evidence of patient's known lung cancer over the right lower lobe measuring 3.6 x 3.9 cm (previously 3.2 x 4 cm) slightly larger nodule over the right middle lobe measuring 1.1 cm (previously 9 mm). Slightly larger nodule over the subpleural location of the posterolateral right lower lobe just above patient's known lung cancer measuring 2 cm in diameter (previously 2.6 cm). No new nodules identified. Small amount of left pleural fluid which is worse. Associated atelectatic change over the left base. Musculoskeletal: Degenerative change of the spine. Review of the MIP images confirms the above  findings. CTA ABDOMEN AND PELVIS FINDINGS VASCULAR Aorta: Calcified plaque over the proximal to mid abdominal aorta. Mild aneurysmal dilatation of the proximal aorta at the level diaphragm measuring 4 cm in AP diameter. Irregularity of the past 5 lumen of the proximal to mid aorta. Patent aortoiliac graft beginning below the renal arteries. Celiac: Patent. SMA: Patent. Renals: Patent left renal artery. 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 IMA: Patent. Inflow: Bilateral iliac artery graft which is patent to level of the femoral arteries. Veins: Unremarkable. Review of the MIP images confirms the above findings. NON-VASCULAR Hepatobiliary: Mild nodular contour of the liver. Previous cholecystectomy. Biliary tree is normal. Pancreas: Normal. Spleen: Normal. Adrenals/Urinary Tract: Adrenal glands are normal. Left kidney is within normal. Atrophic right kidney mild cystic change which is stable. Ureters unremarkable. Bladder is unremarkable. Stomach/Bowel: Moderate size hiatal hernia as the stomach is otherwise unremarkable. Small bowel is normal. Appendix is normal. Minimal diverticulosis of the colon. Lymphatic: No significant adenopathy. Reproductive: Normal. Other: No free fluid or focal inflammatory change. Evidence of previous midline ventral hernia repair. Musculoskeletal: Degenerative change of the spine. Review of the MIP images confirms the above findings. IMPRESSION: 1.  No evidence of pulmonary embolism. 2. New findings compared to 2019-05-12 and likely new compared to the noncontrast PET-CT 09/17/2019 compatible with subacute to chronic intramural hematoma beginning just distal to the takeoff of the left subclavian artery and extending into the distal descending thoracic aorta. Intramural hematoma measures 7 mm in thickness over the distal arch. No evidence of dissection flap. 3. Stable aneurysmal dilatation of the ascending thoracic aorta  measuring 4.1 cm in AP diameter. Recommend annual imaging followup by CTA or MRA. This recommendation follows 2010 ACCF/AHA/AATS/ACR/ASA/SCA/SCAI/SIR/STS/SVM Guidelines for the Diagnosis and Management of Patients with Thoracic Aortic Disease. Circulation. 2010; 121: [id number]. Aortic aneurysm NOS (ICD10-I71.9). Aortic Atherosclerosis (ICD10-I70.0). Aortic aneurysm NOS (ICD10-I71.9). 4. Slight interval worsening of known right lower lobe lung cancer measuring 3.6 x 3.9 cm with suggestion of slight interval worsening 1.1 cm right middle lobe nodule and 2 cm peripheral right lower lobe nodule. No new nodules identified. New small amount left pleural fluid. Minimal worsening mediastinal  and right hilar adenopathy. No evidence of metastatic disease within the abdomen/pelvis. 5.  Emphysema (ICD10-J43.9). 6. 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 Patent aortoiliac graft. No evidence of aortic dissection within the abdominal aorta. 7. 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 8. Colonic diverticulosis. Previous midline ventral hernia repair. Nodular contour to the liver which may be due to a degree of cirrhosis. Hiatal hernia. These results were called by telephone at the time of interpretation on 10/05/2019 at 4:58 pm to provider Dr. Sherwood Gambler, who verbally acknowledged these results. Electronically Signed   By: Marin Olp M.D.   On: 10/05/2019 17:03    EKG: Independently reviewed.  Rate controlled.  No ST elevations or depressions.  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  EKG did not show ST elevations or depressions.  CT a of the chest was negative for PE but showed probable subacute to chronic intramural aortic hematoma.  Patient has been evaluated by  vascular surgery who recommended medical management with tight blood pressure control.  Her blood pressure was extremely elevated on presentation above 200.  Patient is being given labetalol in the ED.  Vascular surgery recommends to have systolic blood pressure 419 mg or less. -2D echo.  Cycle troponins.  If chest pain persists, will consult cardiology. -We will also start empirical IV Protonix because patient has hiatal hernia on CT chest.  Hypertensive emergency in a patient with history of hypertension -Patient presented with pressure above 200.  Monitor blood pressure.  Currently getting labetalol in the ED. -Patient used to be on telmisartan as an outpatient but was discontinued because of hypotension.  Will resume ARB in a.m.  Non-small cell lung cancer stage IV on Keytruda -Outpatient follow-up with oncology.  Slight worsening seen on CAT scan of the chest.  Chronic hypoxic respiratory failure on home oxygen -Wears 3 L of oxygen via nasal cannula.  Continue oxygen supplementation.  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  Outpatient follow-up  Chronic kidney disease stage III -Monitor.  Currently at baseline  Hypothyroidism next-continue Synthroid  Hyperlipidemia -continue statin  Dementia causing memory loss -Monitor mental status.  Fall precaution.  Delirium precautions  Generalized conditioning -PT eval.  DVT prophylaxis: SCDs.  Will avoid Lovenox because of intramural hematoma Code Status: DNR Family Communication: Son at bedside Disposition Plan: Home in 1 to 2 days if clinically improved Consults called: Vascular surgery Admission status: Observation/progressive  Severity of Illness: The appropriate patient status for this patient is OBSERVATION. Observation status is judged to be reasonable and necessary in order to provide the required intensity of service to  ensure the patient's safety. The patient's presenting symptoms, physical exam findings, and initial radiographic and laboratory data in the context of their medical condition is felt to place them at decreased risk for further clinical deterioration. Furthermore, it is anticipated that the patient will be medically stable for discharge from the hospital within 2 midnights of admission. The following factors support the patient status of observation.   " The patient's presenting symptoms include chest pain. " The physical exam findings include extremely elevated blood pressure. 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      Sabiha Sura Starla Link  MD [facility]  10/05/2019, 6:21 PM

## 2019-10-05 NOTE — Consult Note (Signed)
Hospital Consult    Reason for Consult:  Type B IMH Referring Physician:  Dr. Regenia Skeeter (ED) MRN #:  [id number]  History of Present Illness: This is a 78 y.o. female history of valve replacement, aortobifemoral bypass performed 20 years ago at [facility] and known ascending aortic aneurysm.  States that she is undergoing treatment for lung cancer currently with Beryle Flock has not had other chemotherapy or radiation.  Has had pain for approximately 1 week in her epigastrium.  Denies any pain like this previously.  Pain does not radiate.  She has not had loss of appetite with the pain.  She does not take blood thinners has been taking aspirin.  Has been taken off of most of her antihypertensives recently for hypotension.  Pain is better now that she is here.  She was a smoker for 40 years quit around 2000.    Per her son at bedside blood pressure was 200/100 at the time of EMT arrival.  Past Medical History:  Diagnosis Date  . Anemia   . Anxiety   . Arthritis   . Asthma   . Back pain, chronic   . CHF (congestive heart failure) ([place])   . COPD (chronic obstructive pulmonary disease) ([place])   . Depression   . GERD (gastroesophageal reflux disease)   . Headache    migraines  . Hyperlipidemia   . Hypertension   . Hypothyroidism   . 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 Lumbar stenosis   . Pneumonia   . PONV (postoperative nausea and vomiting)   . Retinal tear    right eye  . Seizures ([place])    with morphine  . Shortness of breath dyspnea    with exertion  . Sleep apnea    wears CPAP  . TIA (transient ischemic attack)   . UTI (lower urinary tract infection)   . Wears glasses     Past Surgical History:  Procedure Laterality Date  . aortic endarterectomy    . aortobifemoral bypass    . BREAST SURGERY     cyst aspiration right breast  . CARDIAC SURGERY     valve replacement 2013; aorta replacement 2002  . CATARACT EXTRACTION W/ INTRAOCULAR LENS  IMPLANT, BILATERAL    .  CHOLECYSTECTOMY    . COLONOSCOPY    . HERNIA REPAIR    . LUMBAR EPIDURAL INJECTION    . LUMBAR LAMINECTOMY/DECOMPRESSION MICRODISCECTOMY Left 02/27/2016   Procedure: Laminectomy and Foraminotomy - Lumbar four-five -Lumbar three-four - left diskectomy Lumbar three-four;  Surgeon: Kary Kos, MD;  Location: [facility];  Service: Neurosurgery;  Laterality: Left;  Marland Kitchen MULTIPLE TOOTH EXTRACTIONS    . TUBAL LIGATION      Allergies  Allergen Reactions  . Tape Rash and Other (See Comments)    SKIN IS VERY THIN; TAPE TEARS THE SKIN!!  . Amlodipine Swelling and Other (See Comments)    Feet/toes swelling  . Metoprolol Other (See Comments)    Bradycardia and fatigue  . Morphine And Related Other (See Comments)    Hallucinations/seizures    Prior to Admission medications   Medication Sig Start Date End Date Taking? Authorizing Provider  acetaminophen (TYLENOL) 500 MG tablet Take 1,000 mg by mouth every 6 (six) hours as needed (for pain or headaches).     [provider]  albuterol (ACCUNEB) 0.63 MG/3ML nebulizer solution Take 1 ampule by nebulization 2 (two) times daily as needed for wheezing or shortness of breath (or congestion).  [provider]  albuterol (PROVENTIL HFA;VENTOLIN HFA) 108 (90 Base) MCG/ACT inhaler Inhale 2 puffs into the lungs every 6 (six) hours as needed for wheezing or shortness of breath. 02/02/19   Vivi Barrack, MD  aspirin EC 81 MG tablet Take 81 mg by mouth daily.    [provider]  calcium carbonate (CALCIUM 600) 600 MG TABS tablet Take 600 mg by mouth 2 (two) times daily with a meal.    [provider]  cetirizine (ZYRTEC) 10 MG tablet Take 10 mg by mouth daily.     [provider]  Cholecalciferol (VITAMIN D-3) 25 MCG (1000 UT) CAPS Take 2,000 Units by mouth daily.    [provider]  conjugated estrogens (PREMARIN) vaginal cream Place 1 Applicatorful vaginally See admin instructions. Insert a pea-sized amount of  cream into the vagina 2-3 times a week    [provider]  diazepam (VALIUM) 5 MG tablet TAKE 1 TABLET BY MOUTH  TWICE DAILY AS NEEDED FOR  ANXIETY 06/12/19   Vivi Barrack, MD  diclofenac sodium (VOLTAREN) 1 % GEL Apply 4 g topically 4 (four) times daily. 10/14/18   Mariel Aloe, MD  FLUoxetine (PROZAC) 20 MG capsule Take 1 capsule (20 mg total) by mouth daily. 02/19/19   Vivi Barrack, MD  gabapentin (NEURONTIN) 300 MG capsule TAKE 2 CAPSULES BY MOUTH AT BEDTIME Patient taking differently: 600 mg.  06/15/19   Vivi Barrack, MD  gabapentin (NEURONTIN) 600 MG tablet  07/17/19   [provider]  levothyroxine (SYNTHROID) 100 MCG tablet Take by mouth. 08/29/19   [provider]  nitroGLYCERIN (NITROSTAT) 0.4 MG SL tablet Place 1 tablet (0.4 mg total) under the tongue every 5 (five) minutes. Patient taking differently: Place 0.4 mg under the tongue every 5 (five) minutes as needed for chest pain.  04/27/15   Hilty, Nadean Corwin, MD  Omega-3 1000 MG CAPS Take 1,000 mg by mouth daily.     [provider]  omeprazole (PRILOSEC) 20 MG capsule TAKE 1 CAPSULE BY MOUTH  DAILY BEFORE BREAKFAST 06/15/19   Vivi Barrack, MD  OXYGEN Inhale 2-4 L into the lungs at bedtime.    [provider]  pravastatin (PRAVACHOL) 20 MG tablet TAKE 1 TABLET BY MOUTH AT  BEDTIME 06/15/19   Vivi Barrack, MD  predniSONE (DELTASONE) 50 MG tablet Take 1 tablet daily for 5 days. 09/02/19   Vivi Barrack, MD  umeclidinium bromide (INCRUSE ELLIPTA) 62.5 MCG/INH AEPB Inhale 1 puff into the lungs daily. 02/19/19   Vivi Barrack, MD  vitamin B-12 (CYANOCOBALAMIN) 1000 MCG tablet Take 1,000 mcg by mouth daily.    [provider]    Social History   Socioeconomic History  . Marital status: Widowed    Spouse name: Not on file  . Number of children: 1  . Years of education: Not on file  . Highest education level: Not on file  Occupational History  . Not on file  Social  Needs  . Financial resource strain: Not on file  . Food insecurity    Worry: Not on file    Inability: Not on file  . Transportation needs    Medical: Not on file    Non-medical: Not on file  Tobacco Use  . Smoking status: Former Smoker    Quit date: 10/30/2003    Years since quitting: 15.9  . Smokeless tobacco: Never Used  Substance and Sexual Activity  . Alcohol  use: No  . Drug use: No  . Sexual activity: Not Currently    Partners: Male  Lifestyle  . Physical activity    Days per week: Not on file    Minutes per session: Not on file  . Stress: Not on file  Relationships  . Social Herbalist on phone: Not on file    Gets together: Not on file    Attends religious service: Not on file    Active member of club or organization: Not on file    Attends meetings of clubs or organizations: Not on file    Relationship status: Not on file  . Intimate partner violence    Fear of current or ex partner: Not on file    Emotionally abused: Not on file    Physically abused: Not on file    Forced sexual activity: Not on file  Other Topics Concern  . Not on file  Social History Narrative   Right handed      Some College      Lives alone      Has walker      Does not drive     Family History  Problem Relation Age of Onset  . Other Mother   . Alzheimer's disease Father   . Heart disease Maternal Grandmother   . Heart disease Maternal Aunt     ROS: Cardiovascular: [x]  chest pain/pressure []  palpitations []  SOB lying flat []  DOE []  pain in legs while walking []  pain in legs at rest []  pain in legs at night []  non-healing ulcers []  hx of DVT []  swelling in legs  Pulmonary: []  productive cough []  asthma/wheezing []  home O2  Neurologic: []  weakness in []  arms []  legs []  numbness in []  arms []  legs []  hx of CVA []  mini stroke [] difficulty speaking or slurred speech []  temporary loss of vision in one eye []  dizziness  Hematologic: []  hx of cancer  []  bleeding problems []  problems with blood clotting easily  Endocrine:   []  diabetes []  thyroid disease  GI []  vomiting blood []  blood in stool  GU: []  CKD/renal failure []  HD--[]  M/W/F or []  T/T/S []  burning with urination []  blood in urine  Psychiatric: []  anxiety []  depression  Musculoskeletal: []  arthritis []  joint pain  Integumentary: []  rashes []  ulcers  Constitutional: []  fever []  chills   Physical Examination  Vitals:   10/05/19 1400 10/05/19 1500  BP: (!) 151/81 (!) 150/85  Pulse: 77 74  Resp: (!) 21 (!) 22  Temp:    SpO2: 98% 99%   Body mass index is 27.1 kg/m.  General: No acute distress HENT: WNL, normocephalic Pulmonary: normal non-labored breathing Cardiac: Palpable radial, common femoral, dorsalis pedis pulses bilaterally Abdomen:  soft, NT/ND, no masses Extremities: Moving all extremities without limitation, all are well perfused. Neurologic: A&O X 3; Appropriate Affect ; SENSATION: normal; MOTOR FUNCTION:  moving all extremities equally. Speech is fluent/normal   CBC    Component Value Date/Time   WBC 10.2 10/05/2019 1342   RBC 4.06 10/05/2019 1342   HGB 11.7 (L) 10/05/2019 1342   HCT 36.8 10/05/2019 1342   PLT 355 10/05/2019 1342   MCV 90.6 10/05/2019 1342   MCH 28.8 10/05/2019 1342   MCHC 31.8 10/05/2019 1342   RDW 14.3 10/05/2019 1342    BMET    Component Value Date/Time   NA 135 10/05/2019 1342   K 4.0 10/05/2019 1342   CL 102 10/05/2019 1342  CO2 23 10/05/2019 1342   GLUCOSE 98 10/05/2019 1342   BUN 21 10/05/2019 1342   CREATININE 1.25 (H) 10/05/2019 1342   CREATININE 1.38 (H) 05/10/2015 1106   CALCIUM 8.6 (L) 10/05/2019 1342   GFRNONAA 41 (L) 10/05/2019 1342   GFRAA 47 (L) 10/05/2019 1342    COAGS: No results found for: INR, PROTIME   Non-Invasive Vascular Imaging:   CTA angio IMPRESSION: 1.  No evidence of pulmonary embolism.  2. New findings compared to 05/05/2019 and likely new compared to the  noncontrast PET-CT 09/17/2019 compatible with subacute to chronic intramural hematoma beginning just distal to the takeoff of the left subclavian artery and extending into the distal descending thoracic aorta. Intramural hematoma measures 7 mm in thickness over the distal arch. No evidence of dissection flap.  3. 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 Recommend annual imaging followup by CTA or MRA. This recommendation follows 2010 ACCF/AHA/AATS/ACR/ASA/SCA/SCAI/SIR/STS/SVM Guidelines for the Diagnosis and Management of Patients with Thoracic Aortic Disease. Circulation. 2010; 121: U633-H545. Aortic aneurysm NOS (ICD10-I71.9). Aortic Atherosclerosis (ICD10-I70.0). Aortic aneurysm NOS (ICD10-I71.9).  4. Slight interval worsening of known right lower lobe lung cancer measuring 3.6 x 3.9 cm with suggestion of slight interval worsening 1.1 cm right middle lobe nodule and 2 cm peripheral right lower lobe nodule. No new nodules identified. New small amount left pleural fluid. Minimal worsening mediastinal and right hilar adenopathy. No evidence of metastatic disease within the abdomen/pelvis.  5.  Emphysema (ICD10-J43.9).  6. 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 Patent aortoiliac graft. No evidence of aortic dissection within the abdominal aorta.  7. 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  8. Colonic diverticulosis. Previous midline ventral hernia repair. Nodular contour to the liver which may be due to a degree of cirrhosis. Hiatal hernia.  These results were called by telephone at the time of interpretation on 10/05/2019 at 4:58 pm to provider Dr. Sherwood Gambler, who verbally acknowledged these results.    ASSESSMENT/PLAN: This is a 78 y.o. female with type B IMH, unknown duration.  Certainly  recently coming off of antihypertensive medicines with systolic pressure of 625 today could explain acute IMH.  She does not have any evidence of malperfusion with a right renal artery that is occluded and appears to be chronically so with an atrophic right kidney.  Chest pain is much improved now.  Should be okay for admission to hospitalist does not appear to require ICU at this time given that she is asymptomatic with unknown acuity.  We will continue to follow patient while inpatient will need blood pressure to have tighter control with goal systolic 638 mmHg or less.  We will plan to repeat scan in 4 to 6 weeks unless she has other issues we can repeat while she is inpatient.  Rhesa Forsberg C. Donzetta Matters, MD [facility] Office: [phone] Pager: [phone]

## 2019-10-05 NOTE — Progress Notes (Signed)
CSW noted consult. Patient being admitted. Unit CSW will continue to follow in  Regards to patient's  housing concerns.    Elliot Gurney, [place] Social Worker [phone]

## 2019-10-06 ENCOUNTER — Observation Stay (HOSPITAL_BASED_OUTPATIENT_CLINIC_OR_DEPARTMENT_OTHER): Payer: Medicare Other

## 2019-10-06 ENCOUNTER — Encounter (HOSPITAL_COMMUNITY): Payer: Self-pay | Admitting: General Practice

## 2019-10-06 DIAGNOSIS — R0789 Other chest pain: Secondary | ICD-10-CM | POA: Diagnosis not present

## 2019-10-06 DIAGNOSIS — I7101 Dissection of thoracic aorta: Secondary | ICD-10-CM | POA: Diagnosis not present

## 2019-10-06 DIAGNOSIS — J449 Chronic obstructive pulmonary disease, unspecified: Secondary | ICD-10-CM | POA: Diagnosis not present

## 2019-10-06 DIAGNOSIS — R079 Chest pain, unspecified: Secondary | ICD-10-CM

## 2019-10-06 DIAGNOSIS — N183 Chronic kidney disease, stage 3 unspecified: Secondary | ICD-10-CM | POA: Diagnosis not present

## 2019-10-06 DIAGNOSIS — I161 Hypertensive emergency: Secondary | ICD-10-CM | POA: Diagnosis not present

## 2019-10-06 LAB — COMPREHENSIVE METABOLIC PANEL
ALT: 10 U/L (ref 0–44)
AST: 17 U/L (ref 15–41)
Albumin: 2.2 g/dL — ABNORMAL LOW (ref 3.5–5.0)
Alkaline Phosphatase: 42 U/L (ref 38–126)
Anion gap: 9 (ref 5–15)
BUN: 19 mg/dL (ref 8–23)
CO2: 25 mmol/L (ref 22–32)
Calcium: 8.4 mg/dL — ABNORMAL LOW (ref 8.9–10.3)
Chloride: 103 mmol/L (ref 98–111)
Creatinine, Ser: 1.56 mg/dL — ABNORMAL HIGH (ref 0.44–1.00)
GFR calc Af Amer: 36 mL/min — ABNORMAL LOW (ref >60)
GFR calc non Af Amer: 31 mL/min — ABNORMAL LOW (ref >60)
Glucose, Bld: 85 mg/dL (ref 70–99)
Potassium: 4 mmol/L (ref 3.5–5.1)
Sodium: 137 mmol/L (ref 135–145)
Total Bilirubin: 0.6 mg/dL (ref 0.3–1.2)
Total Protein: 5.7 g/dL — ABNORMAL LOW (ref 6.5–8.1)

## 2019-10-06 LAB — LIPID PANEL
Cholesterol: 118 mg/dL (ref 0–200)
HDL: 30 mg/dL — ABNORMAL LOW (ref >40)
LDL Cholesterol: 68 mg/dL (ref 0–99)
Total CHOL/HDL Ratio: 3.9 RATIO
Triglycerides: 99 mg/dL (ref <150)
VLDL: 20 mg/dL (ref 0–40)

## 2019-10-06 LAB — CBC WITH DIFFERENTIAL/PLATELET
Abs Immature Granulocytes: 0.1 10*3/uL — ABNORMAL HIGH (ref 0.00–0.07)
Basophils Absolute: 0.1 10*3/uL (ref 0.0–0.1)
Basophils Relative: 1 %
Eosinophils Absolute: 0.2 10*3/uL (ref 0.0–0.5)
Eosinophils Relative: 2 %
HCT: 37.4 % (ref 36.0–46.0)
Hemoglobin: 11.6 g/dL — ABNORMAL LOW (ref 12.0–15.0)
Immature Granulocytes: 1 %
Lymphocytes Relative: 15 %
Lymphs Abs: 1.2 10*3/uL (ref 0.7–4.0)
MCH: 28.5 pg (ref 26.0–34.0)
MCHC: 31 g/dL (ref 30.0–36.0)
MCV: 91.9 fL (ref 80.0–100.0)
Monocytes Absolute: 0.8 10*3/uL (ref 0.1–1.0)
Monocytes Relative: 10 %
Neutro Abs: 5.6 10*3/uL (ref 1.7–7.7)
Neutrophils Relative %: 71 %
Platelets: 286 10*3/uL (ref 150–400)
RBC: 4.07 MIL/uL (ref 3.87–5.11)
RDW: 14.3 % (ref 11.5–15.5)
WBC: 7.9 10*3/uL (ref 4.0–10.5)
nRBC: 0 % (ref 0.0–0.2)

## 2019-10-06 LAB — ECHOCARDIOGRAM COMPLETE
Height: 67 in
Weight: 2616 oz

## 2019-10-06 LAB — MAGNESIUM: Magnesium: 2.1 mg/dL (ref 1.7–2.4)

## 2019-10-06 MED ORDER — VERAPAMIL HCL ER 120 MG PO TBCR: 120.0000 mg | EXTENDED_RELEASE_TABLET | Freq: Every day | ORAL | Status: DC

## 2019-10-06 MED ORDER — VERAPAMIL HCL ER 120 MG PO TBCR: 120.0000 mg | EXTENDED_RELEASE_TABLET | Freq: Every day | ORAL | 0 refills | Status: DC

## 2019-10-06 MED ORDER — ENSURE ENLIVE PO LIQD
237.0000 mL | Freq: Two times a day (BID) | ORAL | Status: DC
  Administered 2019-10-06: 237 mL via ORAL

## 2019-10-06 MED ORDER — PANTOPRAZOLE SODIUM 40 MG PO TBEC: 40.0000 mg | DELAYED_RELEASE_TABLET | Freq: Two times a day (BID) | ORAL | Status: DC

## 2019-10-06 MED ORDER — PANTOPRAZOLE SODIUM 40 MG PO TBEC: 40.0000 mg | DELAYED_RELEASE_TABLET | Freq: Two times a day (BID) | ORAL | 0 refills | Status: DC

## 2019-10-06 MED ORDER — SODIUM CHLORIDE 0.9 % IV SOLN
INTRAVENOUS | Status: DC
  Administered 2019-10-06: 13:00:00 via INTRAVENOUS

## 2019-10-06 MED ORDER — ALUM & MAG HYDROXIDE-SIMETH 200-200-20 MG/5ML PO SUSP: 15.0000 mL | Freq: Four times a day (QID) | ORAL | 0 refills | Status: AC | PRN

## 2019-10-06 NOTE — Discharge Summary (Signed)
Physician Discharge Summary  Jill Bryant LSL:[id number] DOB: 10/07/1941 DOA: 10/05/2019  PCP: Vivi Barrack, MD  Admit date: 10/05/2019 Discharge date: 10/06/2019  Admitted From: Independent living facility Disposition: Independent living facility  Recommendations for Outpatient Follow-up:  1. Follow up with PCP in 1 week with repeat CBC/BMP 2. Outpatient follow-up with vascular surgery and cardiology 3. Follow up in ED if symptoms worsen or new appear   Home Health: Home with PT/OT Equipment/Devices: None  Discharge Condition: Stable CODE STATUS: DNR  diet recommendation: Heart healthy  Brief/Interim Summary:  78 y.o. female with medical history significant of non-small cell lung cancer stage IV on Keytruda, hypertension, hyperlipidemia, hypothyroidism, OSA on CPAP, depression and anxiety, memory loss, peripheral neuropathy, CAD status post cardiac surgery and valve replacement along with history of aortobifemoral bypass presented with chest pain on presentation, she was found to have extremely elevated blood pressure with initial troponin of 19. CT angiography did not show an obvious dissection but did show intramural aortic hematoma.  Vascular surgery was consulted who recommended better blood pressure control.  During the hospitalization, she was also started on intravenous Protonix.  Her chest pain has improved.  Troponins did not trend up significantly.  Vascular surgery recommends outpatient follow-up in 4 to 6 weeks with repeat CT angio.  Cardiology is recommending starting verapamil for blood pressure control and outpatient follow-up.  Will discharge patient back to independent living facility with physical therapy.  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  EKG did not show ST elevations or depressions.  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  Patient was evaluated by vascular surgery who recommended medical management with tight blood pressure control.  Her blood pressure was extremely elevated on presentation above 200.    She was given IV labetalol in the ED.  Vascular surgery recommends to have systolic blood pressure 115 mg or less.  Subsequently blood pressure has remained stable but on the lower side. -2D echo formal read is pending but apparently it showed normal LV function without regional wall motion abnormalities as per cardiologist.    Troponins did not trend up.  -No further chest pain since admission.  Cardiology has evaluated the patient and recommend outpatient follow-up. -Started on empirical IV Protonix because patient has hiatal hernia on CT chest.  Will discharge on oral Protonix twice a day.  Hypertensive emergency in a patient with history of hypertension -Patient presented with pressure above 200.  Received IV labetalol in the ED.  Subsequently blood pressure has been on the lower side. -Patient used to be on telmisartan as an outpatient but was discontinued because of hypotension.    Will not resume telmisartan on discharge but start the patient on low-dose verapamil 120 mg daily.  Monitor blood pressure as an outpatient.  Non-small cell lung cancer stage IV on Keytruda -Outpatient follow-up with oncology.  Slight worsening seen on CAT scan of the chest.  Chronic hypoxic respiratory failure on home oxygen -Wears 3 L of oxygen via nasal cannula.  Continue oxygen supplementation.  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  Outpatient follow-up  Chronic kidney disease stage III -Outpatient follow-up.  Currently at baseline  Hypothyroidism -continue Synthroid  Hyperlipidemia -continue statin  Dementia causing memory  loss -Outpatient  follow-up.  Generalized conditioning -PT recommends home health PT.  Will arrange for PT at independent living facility.  Son is hoping that patient might be able to be placed in an assisted living facility at some point.  This can be done as an outpatient as well.   Discharge Instructions   Allergies as of 10/06/2019      Reactions   Tape Rash, Other (See Comments)   SKIN IS VERY THIN; TAPE TEARS THE SKIN!!   Amlodipine Swelling, Other (See Comments)   Feet/toes swelling   Metoprolol Other (See Comments)   Bradycardia and fatigue   Morphine And Related Other (See Comments)   Hallucinations/seizures      Medication List    STOP taking these medications   aspirin EC 81 MG tablet   omeprazole 20 MG capsule Commonly known as: PRILOSEC   predniSONE 50 MG tablet Commonly known as: DELTASONE     TAKE these medications   acetaminophen 500 MG tablet Commonly known as: TYLENOL Take 1,000 mg by mouth every 6 (six) hours as needed (for pain or headaches).   albuterol 0.63 MG/3ML nebulizer solution Commonly known as: ACCUNEB Take 1 ampule by nebulization 2 (two) times daily as needed for wheezing or shortness of breath (or congestion).   albuterol 108 (90 Base) MCG/ACT inhaler Commonly known as: VENTOLIN HFA Inhale 2 puffs into the lungs every 6 (six) hours as needed for wheezing or shortness of breath.   alum & mag hydroxide-simeth 200-200-20 MG/5ML suspension Commonly known as: MAALOX/MYLANTA Take 15 mLs by mouth every 6 (six) hours as needed for indigestion or heartburn.   Calcium 600 600 MG Tabs tablet Generic drug: calcium carbonate Take 600 mg by mouth 2 (two) times daily with a meal.   cetirizine 10 MG tablet Commonly known as: ZYRTEC Take 10 mg by mouth daily.   conjugated estrogens vaginal cream Commonly known as: PREMARIN Place 1 Applicatorful vaginally See admin instructions. Insert a pea-sized amount of cream into the vagina 2-3 times a  week   diazepam 5 MG tablet Commonly known as: VALIUM TAKE 1 TABLET BY MOUTH  TWICE DAILY AS NEEDED FOR  ANXIETY What changed:   when to take this  reasons to take this  additional instructions   diclofenac sodium 1 % Gel Commonly known as: VOLTAREN Apply 4 g topically 4 (four) times daily.   Dulera 100-5 MCG/ACT Aero Generic drug: mometasone-formoterol Inhale 2 puffs into the lungs 2 (two) times daily.   ferrous sulfate 325 (65 FE) MG tablet Take 325 mg by mouth daily with breakfast.   FLUoxetine 20 MG capsule Commonly known as: PROZAC Take 1 capsule (20 mg total) by mouth daily.   gabapentin 300 MG capsule Commonly known as: NEURONTIN TAKE 2 CAPSULES BY MOUTH AT BEDTIME   levothyroxine 100 MCG tablet Commonly known as: SYNTHROID Take 100 mcg by mouth daily before breakfast.   nitroGLYCERIN 0.4 MG SL tablet Commonly known as: NITROSTAT Place 1 tablet (0.4 mg total) under the tongue every 5 (five) minutes. What changed:   when to take this  reasons to take this   Omega-3 1000 MG Caps Take 1,000 mg by mouth daily.   OXYGEN Inhale 2-4 L into the lungs at bedtime.   pantoprazole 40 MG tablet Commonly known as: Protonix Take 1 tablet (40 mg total) by mouth 2 (two) times daily before a meal.   pravastatin 20 MG tablet Commonly known as: PRAVACHOL TAKE 1 TABLET BY MOUTH AT  BEDTIME  prednisoLONE acetate 1 % ophthalmic suspension Commonly known as: PRED FORTE Place 1 drop into the left eye 2 (two) times daily.   umeclidinium bromide 62.5 MCG/INH Aepb Commonly known as: Incruse Ellipta Inhale 1 puff into the lungs daily.   verapamil 120 MG CR tablet Commonly known as: CALAN-SR Take 1 tablet (120 mg total) by mouth daily. Start taking on: October 07, 2019   vitamin B-12 1000 MCG tablet Commonly known as: CYANOCOBALAMIN Take 1,000 mcg by mouth daily.   Vitamin D-3 25 MCG (1000 UT) Caps Take 2,000 Units by mouth daily.            Durable  Medical Equipment  (From admission, onward)         Start     Ordered   10/06/19 1449  For home use only DME Shower stool  Once     10/06/19 1448   10/06/19 1448  For home use only DME 4 wheeled rolling walker with seat  Once    Question:  Patient needs a walker to treat with the following condition  Answer:  Weakness   10/06/19 1448           Allergies  Allergen Reactions  . Tape Rash and Other (See Comments)    SKIN IS VERY THIN; TAPE TEARS THE SKIN!!  . Amlodipine Swelling and Other (See Comments)    Feet/toes swelling  . Metoprolol Other (See Comments)    Bradycardia and fatigue  . Morphine And Related Other (See Comments)    Hallucinations/seizures    Consultations:  Vascular surgery/cardiology   Procedures/Studies: Dg Chest 2 View  Result Date: 10/05/2019 CLINICAL DATA:  Chest pain EXAM: CHEST - 2 VIEW COMPARISON:  Chest pain FINDINGS: Right chest wall port catheter tip overlies the SVC. Patchy left basilar density. Opacity at the right lung base likely reflects known mass. No significant pleural effusion pneumothorax. Stable heart size. Possible increased prominence of the descending thoracic aorta. IMPRESSION: Possible increased prominence of the descending thoracic aorta. CTA chest could be considered. Patchy left basilar atelectasis/consolidation. Right basilar opacity reflecting known mass. Electronically Signed   By: Macy Mis M.D.   On: 10/05/2019 14:19   Ct Angio Chest Pe W And/or Wo Contrast  Result Date: 10/05/2019 CLINICAL DATA:  History of right lung cancer with chest pain and shortness-of-breath. Suspect pulmonary embolism. EXAM: CT ANGIOGRAPHY CHEST, ABDOMEN AND PELVIS TECHNIQUE: Multidetector CT imaging through the chest, abdomen and pelvis was performed using the standard protocol during bolus administration of intravenous contrast. Multiplanar reconstructed images and MIPs were obtained and reviewed to evaluate the vascular anatomy. CONTRAST:   122mL OMNIPAQUE IOHEXOL 350 MG/ML SOLN COMPARISON:  PET-CT 09/17/2019 and chest CT 27-May-2019 FINDINGS: CTA CHEST FINDINGS Cardiovascular: Cardiomegaly with calcification of the mitral valve annulus. Previous median sternotomy. 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 Prosthetic aortic valve is present. Normal takeoff of the great vessels from the aortic arch. Beginning distal to the takeoff of the left subclavian artery is wall thickening of the aorta measuring 7 mm over the distal arch which is low-attenuation suggesting subacute to chronic intramural hematoma. There is moderate regularity of the lumen of the aorta over this region. These findings are more pronounced compared to 05-27-19. This becomes less pronounced moving more distally in the descending thoracic aorta. No evidence of aortic dissection flap. Pulmonary arterial system is well opacified without evidence of emboli. Remaining vascular structures are unremarkable. Mediastinum/Nodes: Worsening mediastinal adenopathy with 1.5  cm right paratracheal lymph node (previously 1.3 cm). Moderate subcarinal adenopathy measuring 1.7 cm by short axis (previously 0.4 cm). 1 cm AP window lymph node. 1.2 cm right hilar lymph node. Moderate size hiatal hernia. Moderate size hiatal hernia. Remaining mediastinal structures are unchanged. Lungs/Pleura: Lungs are adequately inflated with moderate centrilobular emphysematous disease. Evidence of patient's known lung cancer over the right lower lobe measuring 3.6 x 3.9 cm (previously 3.2 x 4 cm) slightly larger nodule over the right middle lobe measuring 1.1 cm (previously 9 mm). Slightly larger nodule over the subpleural location of the posterolateral right lower lobe just above patient's known lung cancer measuring 2 cm in diameter (previously 2.6 cm). No new nodules identified. Small amount of left pleural fluid which is worse. Associated atelectatic change over the  left base. Musculoskeletal: Degenerative change of the spine. Review of the MIP images confirms the above findings. CTA ABDOMEN AND PELVIS FINDINGS VASCULAR Aorta: Calcified plaque over the proximal to mid abdominal aorta. Mild aneurysmal dilatation of the proximal aorta at the level diaphragm measuring 4 cm in AP diameter. Irregularity of the past 5 lumen of the proximal to mid aorta. Patent aortoiliac graft beginning below the renal arteries. Celiac: Patent. SMA: Patent. Renals: Patent left renal artery. 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 IMA: Patent. Inflow: Bilateral iliac artery graft which is patent to level of the femoral arteries. Veins: Unremarkable. Review of the MIP images confirms the above findings. NON-VASCULAR Hepatobiliary: Mild nodular contour of the liver. Previous cholecystectomy. Biliary tree is normal. Pancreas: Normal. Spleen: Normal. Adrenals/Urinary Tract: Adrenal glands are normal. Left kidney is within normal. Atrophic right kidney mild cystic change which is stable. Ureters unremarkable. Bladder is unremarkable. Stomach/Bowel: Moderate size hiatal hernia as the stomach is otherwise unremarkable. Small bowel is normal. Appendix is normal. Minimal diverticulosis of the colon. Lymphatic: No significant adenopathy. Reproductive: Normal. Other: No free fluid or focal inflammatory change. Evidence of previous midline ventral hernia repair. Musculoskeletal: Degenerative change of the spine. Review of the MIP images confirms the above findings. IMPRESSION: 1.  No evidence of pulmonary embolism. 2. 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 Intramural hematoma measures 7 mm in thickness over the  distal arch. No evidence of dissection flap. 3. 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 Recommend annual imaging followup by CTA or MRA. This recommendation follows 2010 ACCF/AHA/AATS/ACR/ASA/SCA/SCAI/SIR/STS/SVM Guidelines for the Diagnosis and Management of Patients with Thoracic Aortic Disease. Circulation. 2010; 121: H062-B762. Aortic aneurysm NOS (ICD10-I71.9). Aortic Atherosclerosis (ICD10-I70.0). Aortic aneurysm NOS (ICD10-I71.9). 4. Slight interval worsening of known right lower lobe lung cancer measuring 3.6 x 3.9 cm with suggestion of slight interval worsening 1.1 cm right middle lobe nodule and 2 cm peripheral right lower lobe nodule. No new nodules identified. New small amount left pleural fluid. Minimal worsening mediastinal and right hilar adenopathy. No evidence of metastatic disease within the abdomen/pelvis. 5.  Emphysema (ICD10-J43.9). 6. 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 Patent aortoiliac graft. No evidence of aortic dissection within the abdominal aorta. 7. 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 8. Colonic diverticulosis. Previous midline ventral hernia repair. Nodular contour to the liver  which may be due to a degree of cirrhosis. Hiatal hernia. These results were called by telephone at the time of interpretation on 10/05/2019 at 4:58 pm to provider Dr. Sherwood Gambler, who verbally acknowledged these results. Electronically Signed   By: Marin Olp M.D.   On: 10/05/2019 17:03   Ct Angio Chest/abd/pel For Dissection W And/or W/wo  Result Date: 10/05/2019 CLINICAL DATA:  History of right lung cancer with chest pain and shortness-of-breath. Suspect pulmonary embolism. EXAM: CT ANGIOGRAPHY CHEST, ABDOMEN AND PELVIS TECHNIQUE: Multidetector CT imaging through the chest, abdomen and pelvis was performed using the standard  protocol during bolus administration of intravenous contrast. Multiplanar reconstructed images and MIPs were obtained and reviewed to evaluate the vascular anatomy. CONTRAST:  171mL OMNIPAQUE IOHEXOL 350 MG/ML SOLN COMPARISON:  PET-CT 09/17/2019 and chest CT May 27, 2019 FINDINGS: CTA CHEST FINDINGS Cardiovascular: Cardiomegaly with calcification of the mitral valve annulus. Previous median sternotomy. 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 Prosthetic aortic valve is present. Normal takeoff of the great vessels from the aortic arch. Beginning distal to the takeoff of the left subclavian artery is wall thickening of the aorta measuring 7 mm over the distal arch which is low-attenuation suggesting subacute to chronic intramural hematoma. There is moderate regularity of the lumen of the aorta over this region. These findings are more pronounced compared to 05-27-2019. This becomes less pronounced moving more distally in the descending thoracic aorta. No evidence of aortic dissection flap. Pulmonary arterial system is well opacified without evidence of emboli. Remaining vascular structures are unremarkable. Mediastinum/Nodes: Worsening mediastinal adenopathy with 1.5 cm right paratracheal lymph node (previously 1.3 cm). Moderate subcarinal adenopathy measuring 1.7 cm by short axis (previously 0.4 cm). 1 cm AP window lymph node. 1.2 cm right hilar lymph node. Moderate size hiatal hernia. Moderate size hiatal hernia. Remaining mediastinal structures are unchanged. Lungs/Pleura: Lungs are adequately inflated with moderate centrilobular emphysematous disease. Evidence of patient's known lung cancer over the right lower lobe measuring 3.6 x 3.9 cm (previously 3.2 x 4 cm) slightly larger nodule over the right middle lobe measuring 1.1 cm (previously 9 mm). Slightly larger nodule over the subpleural location of the posterolateral right lower lobe just above patient's known  lung cancer measuring 2 cm in diameter (previously 2.6 cm). No new nodules identified. Small amount of left pleural fluid which is worse. Associated atelectatic change over the left base. Musculoskeletal: Degenerative change of the spine. Review of the MIP images confirms the above findings. CTA ABDOMEN AND PELVIS FINDINGS VASCULAR Aorta: Calcified plaque over the proximal to mid abdominal aorta. Mild aneurysmal dilatation of the proximal aorta at the level diaphragm measuring 4 cm in AP diameter. Irregularity of the past 5 lumen of the proximal to mid aorta. Patent aortoiliac graft beginning below the renal arteries. Celiac: Patent. SMA: Patent. Renals: Patent left renal artery. 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 IMA: Patent. Inflow: Bilateral iliac artery graft which is patent to level of the femoral arteries. Veins: Unremarkable. Review of the MIP images confirms the above findings. NON-VASCULAR Hepatobiliary: Mild nodular contour of the liver. Previous cholecystectomy. Biliary tree is normal. Pancreas: Normal. Spleen: Normal. Adrenals/Urinary Tract: Adrenal glands are normal. Left kidney is within normal. Atrophic right kidney mild cystic change which is stable. Ureters unremarkable. Bladder is unremarkable. Stomach/Bowel: Moderate size hiatal hernia as the stomach is otherwise unremarkable. Small bowel is normal. Appendix is normal. Minimal diverticulosis of the  colon. Lymphatic: No significant adenopathy. Reproductive: Normal. Other: No free fluid or focal inflammatory change. Evidence of previous midline ventral hernia repair. Musculoskeletal: Degenerative change of the spine. Review of the MIP images confirms the above findings. IMPRESSION: 1.  No evidence of pulmonary embolism. 2. New findings compared to 05/05/2019 and likely new compared to the noncontrast PET-CT 09/17/2019 compatible with subacute to chronic intramural hematoma  beginning just distal to the takeoff of the left subclavian artery and extending into the distal descending thoracic aorta. Intramural hematoma measures 7 mm in thickness over the distal arch. No evidence of dissection flap. 3. 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 Recommend annual imaging followup by CTA or MRA. This recommendation follows 2010 ACCF/AHA/AATS/ACR/ASA/SCA/SCAI/SIR/STS/SVM Guidelines for the Diagnosis and Management of Patients with Thoracic Aortic Disease. Circulation. 2010; 121: P379-K240. Aortic aneurysm NOS (ICD10-I71.9). Aortic Atherosclerosis (ICD10-I70.0). Aortic aneurysm NOS (ICD10-I71.9). 4. Slight interval worsening of known right lower lobe lung cancer measuring 3.6 x 3.9 cm with suggestion of slight interval worsening 1.1 cm right middle lobe nodule and 2 cm peripheral right lower lobe nodule. No new nodules identified. New small amount left pleural fluid. Minimal worsening mediastinal and right hilar adenopathy. No evidence of metastatic disease within the abdomen/pelvis. 5.  Emphysema (ICD10-J43.9). 6. 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 Patent aortoiliac graft. No evidence of aortic dissection within the abdominal aorta. 7. 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 8. Colonic diverticulosis. Previous midline ventral hernia repair. Nodular contour to the liver which may be due to a degree of cirrhosis. Hiatal hernia. These results were called by telephone at the time of interpretation on 10/05/2019 at 4:58 pm to provider Dr. Sherwood Gambler, who verbally acknowledged these results. Electronically Signed   By: Marin Olp M.D.   On: 10/05/2019 17:03    Official echo report is pending   Subjective: Patient seen and examined at bedside.  She is a poor historian but denies any current chest pain.  She feels  better and wants to go home.  I have spoken to son on phone as well.  Discharge Exam: Vitals:   10/06/19 1000 10/06/19 1211  BP: (!) 117/59 (!) 149/75  Pulse:  69  Resp: (!) 21 20  Temp:  98.1 F (36.7 C)  SpO2:  100%    General: Pt is awake, poor historian.  No acute distress Cardiovascular: rate controlled, S1/S2 + Respiratory: bilateral decreased breath sounds at bases with some scattered crackles Abdominal: Soft, NT, ND, bowel sounds + Extremities: no edema, no cyanosis    The results of significant diagnostics from this hospitalization (including imaging, microbiology, ancillary and laboratory) are listed below for reference.     Microbiology: Recent Results (from the past 240 hour(s))  SARS CORONAVIRUS 2 (TAT 6-24 HRS) Nasopharyngeal Nasopharyngeal Swab     Status: None   Collection Time: 10/05/19  5:30 PM   Specimen: Nasopharyngeal Swab  Result Value Ref Range Status   SARS Coronavirus 2 NEGATIVE NEGATIVE Final    Comment: (NOTE) SARS-CoV-2 target nucleic acids are NOT DETECTED. The SARS-CoV-2 RNA is generally detectable in upper and lower respiratory specimens during the acute phase of infection. Negative results do not preclude SARS-CoV-2 infection, do not rule out co-infections with other pathogens, and should not be used as the sole basis for treatment or other patient management decisions. Negative results must be combined with clinical  observations, patient history, and epidemiological information. The expected result is Negative. Fact Sheet for Patients: SugarRoll.be Fact Sheet for Healthcare Providers: https://www.woods-mathews.com/ This test is not yet approved or cleared by the [place] FDA and  has been authorized for detection and/or diagnosis of SARS-CoV-2 by FDA under an Emergency Use Authorization (EUA). This EUA will remain  in effect (meaning this test can be used) for the duration of the COVID-19  declaration under Section 56 4(b)(1) of the Act, 21 U.S.C. section 360bbb-3(b)(1), unless the authorization is terminated or revoked sooner. Performed at [facility], [place] [address], [place], [place] [address]      Labs: BNP (last 3 results) No results for input(s): BNP in the last 8760 hours. Basic Metabolic Panel: Recent Labs  Lab 10/05/19 1342 10/06/19 0529  NA 135 137  K 4.0 4.0  CL 102 103  CO2 23 25  GLUCOSE 98 85  BUN 21 19  CREATININE 1.25* 1.56*  CALCIUM 8.6* 8.4*  MG  --  2.1   Liver Function Tests: Recent Labs  Lab 10/06/19 0529  AST 17  ALT 10  ALKPHOS 42  BILITOT 0.6  PROT 5.7*  ALBUMIN 2.2*   No results for input(s): LIPASE, AMYLASE in the last 168 hours. No results for input(s): AMMONIA in the last 168 hours. CBC: Recent Labs  Lab 10/05/19 1342 10/06/19 0529  WBC 10.2 7.9  NEUTROABS  --  5.6  HGB 11.7* 11.6*  HCT 36.8 37.4  MCV 90.6 91.9  PLT 355 286   Cardiac Enzymes: No results for input(s): CKTOTAL, CKMB, CKMBINDEX, TROPONINI in the last 168 hours. BNP: Invalid input(s): POCBNP CBG: No results for input(s): GLUCAP in the last 168 hours. D-Dimer No results for input(s): DDIMER in the last 72 hours. Hgb A1c No results for input(s): HGBA1C in the last 72 hours. Lipid Profile Recent Labs    10/06/19 0529  CHOL 118  HDL 30*  LDLCALC 68  TRIG 99  CHOLHDL 3.9   Thyroid function studies No results for input(s): TSH, T4TOTAL, T3FREE, THYROIDAB in the last 72 hours.  Invalid input(s): FREET3 Anemia work up No results for input(s): VITAMINB12, FOLATE, FERRITIN, TIBC, IRON, RETICCTPCT in the last 72 hours. Urinalysis    Component Value Date/Time   COLORURINE YELLOW 10/12/2018 1029   APPEARANCEUR CLEAR 10/12/2018 1029   LABSPEC 1.005 10/12/2018 1029   PHURINE 6.0 10/12/2018 1029   GLUCOSEU NEGATIVE 10/12/2018 1029   HGBUR NEGATIVE 10/12/2018 [place] 10/12/2018 [place] 10/12/2018  1029   PROTEINUR NEGATIVE 10/12/2018 1029   UROBILINOGEN 1.0 08/17/2015 1206   NITRITE NEGATIVE 10/12/2018 1029   LEUKOCYTESUR TRACE (A) 10/12/2018 1029   Sepsis Labs Invalid input(s): PROCALCITONIN,  WBC,  LACTICIDVEN Microbiology Recent Results (from the past 240 hour(s))  SARS CORONAVIRUS 2 (TAT 6-24 HRS) Nasopharyngeal Nasopharyngeal Swab     Status: None   Collection Time: 10/05/19  5:30 PM   Specimen: Nasopharyngeal Swab  Result Value Ref Range Status   SARS Coronavirus 2 NEGATIVE NEGATIVE Final    Comment: (NOTE) SARS-CoV-2 target nucleic acids are NOT DETECTED. The SARS-CoV-2 RNA is generally detectable in upper and lower respiratory specimens during the acute phase of infection. Negative results do not preclude SARS-CoV-2 infection, do not rule out co-infections with other pathogens, and should not be used as the sole basis for treatment or other patient management decisions. Negative results must be combined with clinical observations, patient history, and epidemiological information. The  expected result is Negative. Fact Sheet for Patients: SugarRoll.be Fact Sheet for Healthcare Providers: https://www.woods-mathews.com/ This test is not yet approved or cleared by the [place] FDA and  has been authorized for detection and/or diagnosis of SARS-CoV-2 by FDA under an Emergency Use Authorization (EUA). This EUA will remain  in effect (meaning this test can be used) for the duration of the COVID-19 declaration under Section 56 4(b)(1) of the Act, 21 U.S.C. section 360bbb-3(b)(1), unless the authorization is terminated or revoked sooner. Performed at [facility], [place] [address], [place], [place] [address]      Time coordinating discharge: 35 minutes  SIGNED:   Aline August, MD  [facility] 10/06/2019, 2:35 PM

## 2019-10-06 NOTE — ED Notes (Signed)
Pt received meal tray.

## 2019-10-06 NOTE — ED Notes (Signed)
SDU  Breakfast ordered  

## 2019-10-06 NOTE — Evaluation (Signed)
Physical Therapy Evaluation Patient Details Name: Jill Bryant MRN: [id number] DOB: 1941-06-08 Today's Date: 10/06/2019   History of Present Illness  Jill Bryant is a 78 y.o. female with medical history significant of non-small cell lung cancer stage IV on Keytruda, hypertension, hyperlipidemia, hypothyroidism, OSA on CPAP, depression and anxiety, memory loss, peripheral neuropathy, CAD status post cardiac surgery and valve replacement along with history of aortobifemoral bypass presented with chest pain.  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  Clinical Impression  Patient presents with mobility close to baseline, but has had falls recently and feel she will benefit from skilled PT in the acute setting and follow up HHPT at d/c.  Family interested in transitioning pt to ALF.  Feel this is appropriate for her at this time.      Follow Up Recommendations Home health PT    Equipment Recommendations  None recommended by PT    Recommendations for Other Services       Precautions / Restrictions Precautions Precautions: Fall Restrictions Weight Bearing Restrictions: No      Mobility  Bed Mobility Overal bed mobility: Needs Assistance Bed Mobility: Supine to Sit;Sit to Supine     Supine to sit: Min guard Sit to supine: Supervision   General bed mobility comments: reached up to pull up on my hand  Transfers Overall transfer level: Needs assistance Equipment used: Rolling walker (2 wheeled) Transfers: Sit to/from Stand Sit to Stand: Supervision         General transfer comment: assist for safety up from high stretcher  Ambulation/Gait Ambulation/Gait assistance: Supervision Gait Distance (Feet): 200 Feet Assistive device: Rolling walker (2 wheeled) Gait Pattern/deviations: Step-through pattern;Decreased stride length;Shuffle     General Gait Details: skids feet on floor with each step, reports does this  intermittently, has no difficulty turning walker despite not like her rollator, no LOB noted, SpO2 with ambulation 99%, HR 73  Stairs            Wheelchair Mobility    Modified Rankin (Stroke Patients Only)       Balance Overall balance assessment: Needs assistance Sitting-balance support: Single extremity supported;Feet supported Sitting balance-Leahy Scale: Good     Standing balance support: Bilateral upper extremity supported;No upper extremity supported Standing balance-Leahy Scale: Fair Standing balance comment: can stand without UE support, but needs walker for ambulation                             Pertinent Vitals/Pain Pain Assessment: No/denies pain    Home Living Family/patient expects to be discharged to:: Private residence Living Arrangements: Alone Available Help at Discharge: Family;Available PRN/intermittently Type of Home: Independent living facility([facility]) Home Access: Level entry     Home Layout: One level Home Equipment: Walker - 4 wheels;Shower seat Additional Comments: takes meals in her room at facility, having trouble cooking per son    Prior Function Level of Independence: Independent with assistive device(s)         Comments: uses rollator; has had two falls recent with bending over to pick up something, once called family, has lift alert, once called EMS     Hand Dominance        Extremity/Trunk Assessment   Upper Extremity Assessment Upper Extremity Assessment: Overall WFL for tasks assessed    Lower Extremity Assessment Lower Extremity Assessment: Overall WFL for tasks assessed       Communication  Communication: No difficulties  Cognition Arousal/Alertness: Awake/alert Behavior During Therapy: WFL for tasks assessed/performed Overall Cognitive Status: Within Functional Limits for tasks assessed                                        General Comments General comments (skin integrity,  edema, etc.): son in the room reports hopeful to transition pt to ALF soon as she struggles with IADL's, though she reports happy where she is and states managing okay    Exercises     Assessment/Plan    PT Assessment Patient needs continued PT services  PT Problem List Decreased activity tolerance;Decreased mobility;Decreased safety awareness;Decreased balance;Decreased knowledge of use of DME       PT Treatment Interventions Therapeutic activities;DME instruction;Gait training;Functional mobility training;Patient/family education;Balance training    PT Goals (Current goals can be found in the Care Plan section)  Acute Rehab PT Goals Patient Stated Goal: to go home versus consider ALF PT Goal Formulation: With patient/family Time For Goal Achievement: 10/13/19 Potential to Achieve Goals: Good    Frequency Min 3X/week   Barriers to discharge        Co-evaluation               AM-PAC PT "6 Clicks" Mobility  Outcome Measure Help needed turning from your back to your side while in a flat bed without using bedrails?: None Help needed moving from lying on your back to sitting on the side of a flat bed without using bedrails?: A Little Help needed moving to and from a bed to a chair (including a wheelchair)?: None Help needed standing up from a chair using your arms (e.g., wheelchair or bedside chair)?: A Little Help needed to walk in hospital room?: None Help needed climbing 3-5 steps with a railing? : A Little 6 Click Score: 21    End of Session   Activity Tolerance: Patient tolerated treatment well Patient left: in bed;with call bell/phone within reach Nurse Communication: Mobility status PT Visit Diagnosis: Other abnormalities of gait and mobility (R26.89);Muscle weakness (generalized) (M62.81)    Time: 1130-1150 PT Time Calculation (min) (ACUTE ONLY): 20 min   Charges:   PT Evaluation $PT Eval Moderate Complexity: [place], [place] Acute  Rehabilitation Services [phone] 10/06/2019   Reginia Naas 10/06/2019, 2:13 PM

## 2019-10-06 NOTE — TOC Initial Note (Signed)
Transition of Care [facility]) - Initial/Assessment Note    Patient Details  Name: Jill Bryant MRN: [id number] Date of Birth: 1941-01-05  Transition of Care [facility]) CM/SW Contact:    Alberteen Sam, [place] Phone Number: [phone] 10/06/2019, 2:47 PM  Clinical Narrative:                  CSW spoke with son Tim regarding discharge plan, he reports patient is from [facility] and that they are potentially looking at transitioning patient to ALF in the future. He reports being in agreement with patient going home with home health today, they have been using [facility] for [facility] PT.   CSW informed Tim that additional services can be added such as Education officer, museum and aid, Education officer, museum to assist with assisting family in ALF decision in the future. Octavia Bruckner is in agreement, and reports DME needs of rollator and shower chair.   CSW has reached out to [place] with [employer] for rollator and shower chair to be delivered to room. CSW has reached out to [facility] with [facility] to inform of added services needed for [facility] (PT, OT, aide and social work).   Tim reports he will transport patient home when ready to discharge.   Expected Discharge Plan: (ILF - [facility]) Barriers to Discharge: No Barriers Identified   Patient Goals and CMS Choice Patient states their goals for this hospitalization and ongoing recovery are:: to go back home CMS Medicare.gov Compare Post Acute Care list provided to:: Patient Represenative (must comment)(son Tim) Choice offered to / list presented to : Adult Children  Expected Discharge Plan and Services Expected Discharge Plan: (ILF - [facility])       Living arrangements for the past 2 months: Independent Living Facility([facility])                 DME Arranged: Tub bench, Walker rolling with seat DME Agency: [employer] Date DME Agency Contacted: 10/06/19 Time DME Agency Contacted: [id number] Representative spoke with at DME Agency: [place]: Social Work, Nurse's Aide, OT, PT [place] Agency: [place] Date [place]: 10/06/19 Time [place]: [phone] Representative spoke with at [place] Arrangements/Services Living arrangements for the past 2 months: Independent Living Facility([facility]) Lives with:: Self Patient language and need for interpreter reviewed:: Yes Do you feel safe going back to the place where you live?: Yes      Need for Family Participation in Patient Care: Yes (Comment) Care giver support system in place?: Yes (comment) Current home services: Home PT, Home OT Criminal Activity/Legal Involvement Pertinent to Current Situation/Hospitalization: No - Comment as needed  Activities of Daily Living Home Assistive Devices/Equipment: Oxygen, Walker (specify type), Dentures (specify type)(3l [place]) ADL Screening (condition at time of admission) Patient's cognitive ability adequate to safely complete daily activities?: Yes Is the patient deaf or have difficulty hearing?: No Does the patient have difficulty seeing, even when wearing glasses/contacts?: No Does the patient have difficulty concentrating, remembering, or making decisions?: No Patient able to express need for assistance with ADLs?: Yes Does the patient have difficulty dressing or bathing?: No Independently performs ADLs?: Yes (appropriate for developmental age) Does the patient have difficulty walking or climbing stairs?: Yes Weakness of Legs: Both Weakness of Arms/Hands: None  Permission Sought/Granted Permission sought to share information with : Case Manager, Customer service manager, Family Supports Permission granted to share information with : Yes, Verbal Permission Granted  Share Information with NAME: Octavia Bruckner  Permission granted to  share info w AGENCY: [place] granted to share info w Relationship: son  Permission granted to share info w Contact Information: [phone]  Emotional Assessment Appearance:: Appears stated  age Attitude/Demeanor/Rapport: Unable to Assess Affect (typically observed): Unable to Assess Orientation: : Oriented to Self, Oriented to Place, Oriented to  Time, Oriented to Situation Alcohol / Substance Use: Not Applicable Psych Involvement: No (comment)  Admission diagnosis:  Intramural hematoma of thoracic aorta ([place]) [I71.01] Patient Active Problem List   Diagnosis Date Noted  . Chest pain 10/05/2019  . Hypertensive emergency 10/05/2019  . Intramural aortic hematoma ([place]) 10/05/2019  . Memory loss 08/12/2019  . Dyslipidemia 02/02/2019  . Peripheral arterial disease ([place]) s/p aortobifemoral bypass 10/31/2018  . CKD (chronic kidney disease) stage 3, GFR 30-59 ml/min 10/31/2018  . Major depression with anxiety ([place]) 10/31/2018  . Hypothyroidism 10/31/2018  . Peripheral neuropathy 10/31/2018  . Debility 10/31/2018  . Pulmonary hypertension, unspecified ([place])   . HTN (hypertension) 10/11/2018  . Thoracic aortic aneurysm without rupture ([place]) 02/03/2018  . GERD (gastroesophageal reflux disease) 04/27/2015  . S/P AVR (aortic valve replacement) 02/23/2015  . CAD (coronary artery disease) 02/23/2015  . Carotid artery disease ([place]) 02/23/2015  . COPD, severity to be determined ([place]) 02/23/2015  . OSA on CPAP 02/23/2015   PCP:  Vivi Barrack, MD Pharmacy:   [facility] [place], [place] [facility] [place] [address] [place] [address] Phone: [phone] Fax: [phone]  [place], [place] [address] [place] [address] Phone: [phone] Fax: [phone]  [id number] [place] [place] [address] [place] [address] Phone: [phone] Fax: [phone]     Social Determinants of Health (SDOH) Interventions    Readmission Risk Interventions No flowsheet data found.

## 2019-10-06 NOTE — Progress Notes (Signed)
*  PRELIMINARY RESULTS* Echocardiogram 2D Echocardiogram has been performed.  Sharalee Witman A Lissa Rowles 10/06/2019, 3:34 PM

## 2019-10-06 NOTE — Progress Notes (Signed)
Patient and son anxious to leave. Rollator delivered to patients room, but shower chair has not been delivered. Patient's son states they will pick up shower chair tomorrow. Nurse called Caryl Pina, CSW and left voice message. Will speak with Caryl Pina, CSW regarding shower chair.

## 2019-10-06 NOTE — ED Notes (Signed)
Lunch Tray Ordered @ 1042. 

## 2019-10-06 NOTE — Progress Notes (Signed)
Discharge education and medication education given to patient and son with teach back. Education on low sodium diet and increasing activity slowly given. All questions and concerns answered. Peripheral IV previously trmoved and telemetry leads removed. All patient belongings given to patient.

## 2019-10-06 NOTE — Consult Note (Signed)
Cardiology Consultation:   Patient ID: Denys Labree MRN: [id number]; DOB: Oct 24, 1941  Admit date: 10/05/2019 Date of Consult: 10/06/2019  Primary Care Provider: Vivi Barrack, MD Primary Cardiologist: Pixie Casino, MD  Primary Electrophysiologist:  None    Patient Profile:   Mariena Meares is a 78 y.o. female with a hx of nonsmall cell lung cancer stage IV on Keytruda, hypertension, hyperlipidemia, hypothyroidism, OSA on CPAP, PD on home oxygen, memory loss, pericardial aortic valve replacement 2013, aorto bifemoral bypass, 2002 who is being seen today for the evaluation of chest pain at the request of Dr. Starla Link.  History of Present Illness:   Ms. Kindel has a history of PAD and significant aortic occlusion, status post aortobifem bypass in 2002.  She also has a history of aortic stenosis and in 2013 underwent aortic valve replacement with a 25 mm CE bovine pericardial aortic valve by Dr. Jerelene Redden in [facility].  She was previously followed by cardiologist in [facility].  She has significant COPD, followed by [facility].  She also has obstructive sleep apnea on CPAP and uses oxygen.  Mild carotid artery disease, 1-39% bilaterally, and mild bilateral subclavian stenosis. Her most recent echocardiogram on 10/11/2018 showed normal LV systolic function with EF 55-60%, mild LVH, grade 1 diastolic dysfunction, normally functioning pericardial aortic prosthetic valve.  Pulmonary artery pressure was moderately to severely increased with PA peak pressure of 59 mmHg. Lower extremity dopplers in 06/09/19 with normal ABIs.   Ms. Hoots presented to [facility] emergency department yesterday afternoon with complaints of off-and-on chest pain for the last 5-7 days.  She was given aspirin and nitroglycerin by EMS.  Subsequently her pain subsided.  High-sensitivity troponins very mildly elevated and flat trend 19, 21, 21, 18.  CT angiography did not show an obvious dissection but did show intramural  aortic hematoma.  Vascular surgery, Dr. Donzetta Matters was consulted and noted previous aortobifemoral bypass, no evidence of malperfusion.  Her chest pain resolved with blood pressure control. Dr. Donzetta Matters recommended good blood pressure control.  Given multiple medical comorbidities hopefully can avoid any intervention.  When I entered the room the patient is resting comfortably in bed.  Her son is present.  The patient notes that yesterday evening she developed heavy chest pressure with shortness of breath and felt like she was going to pass out.  She says that when EMS got there her blood pressure was 210/100 and this made her very nervous.  Her chest discomfort and shortness of breath resolved with normalization of blood pressure.  She states that she slept very well last night and is having no further chest discomfort.  She is having chemotherapy infusions about every 3 weeks, due on this Friday.  She says that her blood pressure had been running low and her primary care provider discontinued her blood pressure medicine about a month ago.  She is a little frustrated that her blood pressure runs up and down.  She is concerned that the blood pressure medicine will lower her blood pressure too much.  Heart Pathway Score:     Past Medical History:  Diagnosis Date   Anemia    Anxiety    Arthritis    Asthma    Back pain, chronic    Cancer (HCC)    large cell lung ca     CHF (congestive heart failure) (HCC)    COPD (chronic obstructive pulmonary disease) (HCC)    Depression    GERD (gastroesophageal reflux disease)  Headache    migraines   Hyperlipidemia    Hypertension    Hypothyroidism    Kidney atrophy    with cysts on right   Lumbar stenosis    Pneumonia    PONV (postoperative nausea and vomiting)    Retinal tear    right eye   Seizures (HCC)    with morphine   Shortness of breath dyspnea    with exertion   Sleep apnea    wears CPAP   TIA (transient ischemic  attack)    UTI (lower urinary tract infection)    Wears glasses     Past Surgical History:  Procedure Laterality Date   aortic endarterectomy     aortobifemoral bypass     BREAST SURGERY     cyst aspiration right breast   CARDIAC SURGERY     valve replacement 2013; aorta replacement 2002   CATARACT EXTRACTION W/ INTRAOCULAR LENS  IMPLANT, BILATERAL     CHOLECYSTECTOMY     COLONOSCOPY     HERNIA REPAIR     LUMBAR EPIDURAL INJECTION     LUMBAR LAMINECTOMY/DECOMPRESSION MICRODISCECTOMY Left 02/27/2016   Procedure: Laminectomy and Foraminotomy - Lumbar four-five -Lumbar three-four - left diskectomy Lumbar three-four;  Surgeon: Kary Kos, MD;  Location: [facility];  Service: Neurosurgery;  Laterality: Left;   MULTIPLE TOOTH EXTRACTIONS     PORTA CATH INSERTION  07/2019   TUBAL LIGATION       Home Medications:  Prior to Admission medications   Medication Sig Start Date End Date Taking? Authorizing Provider  acetaminophen (TYLENOL) 500 MG tablet Take 1,000 mg by mouth every 6 (six) hours as needed (for pain or headaches).    Yes [provider]  albuterol (ACCUNEB) 0.63 MG/3ML nebulizer solution Take 1 ampule by nebulization 2 (two) times daily as needed for wheezing or shortness of breath (or congestion).    Yes [provider]  albuterol (PROVENTIL HFA;VENTOLIN HFA) 108 (90 Base) MCG/ACT inhaler Inhale 2 puffs into the lungs every 6 (six) hours as needed for wheezing or shortness of breath. 02/02/19  Yes Vivi Barrack, MD  aspirin EC 81 MG tablet Take 81 mg by mouth daily.   Yes [provider]  calcium carbonate (CALCIUM 600) 600 MG TABS tablet Take 600 mg by mouth 2 (two) times daily with a meal.   Yes [provider]  cetirizine (ZYRTEC) 10 MG tablet Take 10 mg by mouth daily.    Yes [provider]  Cholecalciferol (VITAMIN D-3) 25 MCG (1000 UT) CAPS Take 2,000 Units by mouth daily.   Yes [provider]    conjugated estrogens (PREMARIN) vaginal cream Place 1 Applicatorful vaginally See admin instructions. Insert a pea-sized amount of cream into the vagina 2-3 times a week   Yes [provider]  diazepam (VALIUM) 5 MG tablet TAKE 1 TABLET BY MOUTH  TWICE DAILY AS NEEDED FOR  ANXIETY Patient taking differently: Take 5 mg by mouth every 12 (twelve) hours as needed for anxiety.  06/12/19  Yes Vivi Barrack, MD  diclofenac sodium (VOLTAREN) 1 % GEL Apply 4 g topically 4 (four) times daily. 10/14/18  Yes Mariel Aloe, MD  ferrous sulfate 325 (65 FE) MG tablet Take 325 mg by mouth daily with breakfast.   Yes [provider]  FLUoxetine (PROZAC) 20 MG capsule Take 1 capsule (20 mg total) by mouth daily. 02/19/19  Yes Vivi Barrack, MD  gabapentin (NEURONTIN) 300 MG capsule TAKE 2  CAPSULES BY MOUTH AT BEDTIME Patient taking differently: Take 600 mg by mouth at bedtime.  06/15/19  Yes Vivi Barrack, MD  levothyroxine (SYNTHROID) 100 MCG tablet Take 100 mcg by mouth daily before breakfast.  08/29/19  Yes [provider]  mometasone-formoterol (DULERA) 100-5 MCG/ACT AERO Inhale 2 puffs into the lungs 2 (two) times daily. 09/10/19 09/09/20 Yes [provider]  Omega-3 1000 MG CAPS Take 1,000 mg by mouth daily.    Yes [provider]  omeprazole (PRILOSEC) 20 MG capsule TAKE 1 CAPSULE BY MOUTH  DAILY BEFORE BREAKFAST Patient taking differently: Take 20 mg by mouth daily before breakfast.  06/15/19  Yes Vivi Barrack, MD  OXYGEN Inhale 2-4 L into the lungs at bedtime.   Yes [provider]  pravastatin (PRAVACHOL) 20 MG tablet TAKE 1 TABLET BY MOUTH AT  BEDTIME Patient taking differently: Take 20 mg by mouth at bedtime.  06/15/19  Yes Vivi Barrack, MD  prednisoLONE acetate (PRED FORTE) 1 % ophthalmic suspension Place 1 drop into the left eye 2 (two) times daily. 09/17/19  Yes [provider]  umeclidinium bromide (INCRUSE ELLIPTA) 62.5  MCG/INH AEPB Inhale 1 puff into the lungs daily. 02/19/19  Yes Vivi Barrack, MD  vitamin B-12 (CYANOCOBALAMIN) 1000 MCG tablet Take 1,000 mcg by mouth daily.   Yes [provider]  nitroGLYCERIN (NITROSTAT) 0.4 MG SL tablet Place 1 tablet (0.4 mg total) under the tongue every 5 (five) minutes. Patient taking differently: Place 0.4 mg under the tongue every 5 (five) minutes as needed for chest pain.  04/27/15   Hilty, Nadean Corwin, MD  predniSONE (DELTASONE) 50 MG tablet Take 1 tablet daily for 5 days. Patient not taking: Reported on 10/05/2019 09/02/19   Vivi Barrack, MD    Inpatient Medications: Scheduled Meds:  aspirin EC  81 mg Oral Daily   calcium carbonate  1,250 mg Oral BID WC   FLUoxetine  20 mg Oral Daily   gabapentin  600 mg Oral QHS   irbesartan  75 mg Oral Daily   levothyroxine  100 mcg Oral Q0600   pantoprazole  40 mg Oral BID   pravastatin  20 mg Oral QHS   umeclidinium bromide  1 puff Inhalation Daily   vitamin B-12  1,000 mcg Oral Daily   Continuous Infusions:  sodium chloride 75 mL/hr at 10/06/19 1234   PRN Meds: acetaminophen, albuterol, albuterol, alum & mag hydroxide-simeth, diazepam, hydrALAZINE, nitroGLYCERIN, ondansetron (ZOFRAN) IV  Allergies:    Allergies  Allergen Reactions   Tape Rash and Other (See Comments)    SKIN IS VERY THIN; TAPE TEARS THE SKIN!!   Amlodipine Swelling and Other (See Comments)    Feet/toes swelling   Metoprolol Other (See Comments)    Bradycardia and fatigue   Morphine And Related Other (See Comments)    Hallucinations/seizures    Social History:   Social History   Socioeconomic History   Marital status: Widowed    Spouse name: Not on file   Number of children: 1   Years of education: Not on file   Highest education level: Not on file  Occupational History   Not on file  Social Needs   Financial resource strain: Not on file   Food insecurity    Worry: Not on file    Inability: Not on  file   Transportation needs    Medical: Not on file    Non-medical: Not on file  Tobacco Use  Smoking status: Former Smoker    Quit date: 10/30/2003    Years since quitting: 15.9   Smokeless tobacco: Never Used  Substance and Sexual Activity   Alcohol use: No   Drug use: No   Sexual activity: Not Currently    Partners: Male  Lifestyle   Physical activity    Days per week: Not on file    Minutes per session: Not on file   Stress: Not on file  Relationships   Social connections    Talks on phone: Not on file    Gets together: Not on file    Attends religious service: Not on file    Active member of club or organization: Not on file    Attends meetings of clubs or organizations: Not on file    Relationship status: Not on file   Intimate partner violence    Fear of current or ex partner: Not on file    Emotionally abused: Not on file    Physically abused: Not on file    Forced sexual activity: Not on file  Other Topics Concern   Not on file  Social History Narrative   Right handed      Some College      Lives alone      Has walker      Does not drive    Family History:    Family History  Problem Relation Age of Onset   Other Mother    Alzheimer's disease Father    Heart disease Maternal Grandmother    Heart disease Maternal Aunt      ROS:  Please see the history of present illness.   All other ROS reviewed and negative.     Physical Exam/Data:   Vitals:   10/06/19 0915 10/06/19 1000 10/06/19 1209 10/06/19 1211  BP: (!) 110/56 (!) 117/59  (!) 149/75  Pulse:    69  Resp: 20 (!) 21  20  Temp:    98.1 F (36.7 C)  TempSrc:    Oral  SpO2:    100%  Weight:   74.2 kg   Height:   5\' 7"  (1.702 m)    No intake or output data in the 24 hours ending 10/06/19 1313 Last 3 Weights 10/06/2019 10/05/2019 10/05/2019  Weight (lbs) 163 lb 8 oz 173 lb 185 lb 3 oz  Weight (kg) 74.163 kg 78.472 kg 84 kg     Body mass index is 25.61 kg/m.  General:   Well nourished, well developed, elderly female in no acute distress HEENT: normal Neck: no JVD Vascular: No carotid bruits; FA pulses 2+ bilaterally without bruits  Cardiac:  normal S1, S2; RRR; no murmur  Lungs:  clear to auscultation bilaterally, no wheezing, rhonchi or rales  Abd: soft, nontender, no hepatomegaly  Ext: no edema Musculoskeletal:  No deformities, BUE and BLE strength normal and equal Skin: warm and dry  Neuro:  CNs 2-12 intact, no focal abnormalities noted Psych:  Normal affect   EKG:  The EKG was personally reviewed and demonstrates:  Sinus rhythm, 82 bpm, possibly Ectopic atrial rhythm, RBBB and LAFB, Probable lateral infarct, old Telemetry:  Telemetry was personally reviewed and demonstrates: Sinus rhythm in the 60s with occasional PVCs  Relevant CV Studies:  Echocardiogram pending  Echocardiogram 10/11/2018 Study Conclusions  - Left ventricle: The cavity size was normal. Wall thickness was   increased in a pattern of mild LVH. Systolic function was normal.   The estimated ejection fraction was in  the range of 55% to 60%.   Wall motion was normal; there were no regional wall motion   abnormalities. Doppler parameters are consistent with abnormal   left ventricular relaxation (grade 1 diastolic dysfunction). - Aortic valve: 23 mm bovine pericardial prosthesis in aortic   postion. There was no significant regurgitation. Mean gradient   (S): 5 mm Hg. Valve area (VTI): 2.74 cm^2. - Mitral valve: Moderately calcified annulus. There was trivial   regurgitation. - Right ventricle: The cavity size was mildly dilated. - Right atrium: The atrium was at the upper limits of normal in   size. Central venous pressure (est): 3 mm Hg. - Atrial septum: No defect or patent foramen ovale was identified. - Tricuspid valve: There was trivial regurgitation. - Pulmonary arteries: Systolic pressure was moderately to severely   increased. PA peak pressure: 59 mm Hg (S). -  Pericardium, extracardiac: There was no pericardial effusion.  Laboratory Data:  High Sensitivity Troponin:   Recent Labs  Lab 10/05/19 1342 10/05/19 1813 10/05/19 1936 10/05/19 2233  TROPONINIHS 19* 21* 21* 18*     Chemistry Recent Labs  Lab 10/05/19 1342 10/06/19 0529  NA 135 137  K 4.0 4.0  CL 102 103  CO2 23 25  GLUCOSE 98 85  BUN 21 19  CREATININE 1.25* 1.56*  CALCIUM 8.6* 8.4*  GFRNONAA 41* 31*  GFRAA 47* 36*  ANIONGAP 10 9    Recent Labs  Lab 10/06/19 0529  PROT 5.7*  ALBUMIN 2.2*  AST 17  ALT 10  ALKPHOS 42  BILITOT 0.6   Hematology Recent Labs  Lab 10/05/19 1342 10/06/19 0529  WBC 10.2 7.9  RBC 4.06 4.07  HGB 11.7* 11.6*  HCT 36.8 37.4  MCV 90.6 91.9  MCH 28.8 28.5  MCHC 31.8 31.0  RDW 14.3 14.3  PLT 355 286   BNPNo results for input(s): BNP, PROBNP in the last 168 hours.  DDimer No results for input(s): DDIMER in the last 168 hours.   Radiology/Studies:  Dg Chest 2 View  Result Date: 10/05/2019 CLINICAL DATA:  Chest pain EXAM: CHEST - 2 VIEW COMPARISON:  Chest pain FINDINGS: Right chest wall port catheter tip overlies the SVC. Patchy left basilar density. Opacity at the right lung base likely reflects known mass. No significant pleural effusion pneumothorax. Stable heart size. Possible increased prominence of the descending thoracic aorta. IMPRESSION: Possible increased prominence of the descending thoracic aorta. CTA chest could be considered. Patchy left basilar atelectasis/consolidation. Right basilar opacity reflecting known mass. Electronically Signed   By: Macy Mis M.D.   On: 10/05/2019 14:19   Ct Angio Chest Pe W And/or Wo Contrast  Result Date: 10/05/2019 CLINICAL DATA:  History of right lung cancer with chest pain and shortness-of-breath. Suspect pulmonary embolism. EXAM: CT ANGIOGRAPHY CHEST, ABDOMEN AND PELVIS TECHNIQUE: Multidetector CT imaging through the chest, abdomen and pelvis was performed using the standard protocol  during bolus administration of intravenous contrast. Multiplanar reconstructed images and MIPs were obtained and reviewed to evaluate the vascular anatomy. CONTRAST:  171mL OMNIPAQUE IOHEXOL 350 MG/ML SOLN COMPARISON:  PET-CT 09/17/2019 and chest CT 05/05/2019 FINDINGS: CTA CHEST FINDINGS Cardiovascular: Cardiomegaly with calcification of the mitral valve annulus. Previous median sternotomy. 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 Prosthetic aortic valve is present. Normal takeoff of the great vessels from the aortic arch. Beginning distal to the takeoff of the left subclavian artery is wall thickening of the aorta measuring 7 mm over the  distal arch which is low-attenuation suggesting subacute to chronic intramural hematoma. There is moderate regularity of the lumen of the aorta over this region. These findings are more pronounced compared to May 20, 2019. This becomes less pronounced moving more distally in the descending thoracic aorta. No evidence of aortic dissection flap. Pulmonary arterial system is well opacified without evidence of emboli. Remaining vascular structures are unremarkable. Mediastinum/Nodes: Worsening mediastinal adenopathy with 1.5 cm right paratracheal lymph node (previously 1.3 cm). Moderate subcarinal adenopathy measuring 1.7 cm by short axis (previously 0.4 cm). 1 cm AP window lymph node. 1.2 cm right hilar lymph node. Moderate size hiatal hernia. Moderate size hiatal hernia. Remaining mediastinal structures are unchanged. Lungs/Pleura: Lungs are adequately inflated with moderate centrilobular emphysematous disease. Evidence of patient's known lung cancer over the right lower lobe measuring 3.6 x 3.9 cm (previously 3.2 x 4 cm) slightly larger nodule over the right middle lobe measuring 1.1 cm (previously 9 mm). Slightly larger nodule over the subpleural location of the posterolateral right lower lobe just above patient's known lung  cancer measuring 2 cm in diameter (previously 2.6 cm). No new nodules identified. Small amount of left pleural fluid which is worse. Associated atelectatic change over the left base. Musculoskeletal: Degenerative change of the spine. Review of the MIP images confirms the above findings. CTA ABDOMEN AND PELVIS FINDINGS VASCULAR Aorta: Calcified plaque over the proximal to mid abdominal aorta. Mild aneurysmal dilatation of the proximal aorta at the level diaphragm measuring 4 cm in AP diameter. Irregularity of the past 5 lumen of the proximal to mid aorta. Patent aortoiliac graft beginning below the renal arteries. Celiac: Patent. SMA: Patent. Renals: Patent left renal artery. 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 IMA: Patent. Inflow: Bilateral iliac artery graft which is patent to level of the femoral arteries. Veins: Unremarkable. Review of the MIP images confirms the above findings. NON-VASCULAR Hepatobiliary: Mild nodular contour of the liver. Previous cholecystectomy. Biliary tree is normal. Pancreas: Normal. Spleen: Normal. Adrenals/Urinary Tract: Adrenal glands are normal. Left kidney is within normal. Atrophic right kidney mild cystic change which is stable. Ureters unremarkable. Bladder is unremarkable. Stomach/Bowel: Moderate size hiatal hernia as the stomach is otherwise unremarkable. Small bowel is normal. Appendix is normal. Minimal diverticulosis of the colon. Lymphatic: No significant adenopathy. Reproductive: Normal. Other: No free fluid or focal inflammatory change. Evidence of previous midline ventral hernia repair. Musculoskeletal: Degenerative change of the spine. Review of the MIP images confirms the above findings. IMPRESSION: 1.  No evidence of pulmonary embolism. 2. New findings compared to May 20, 2019 and likely new compared to the noncontrast PET-CT 09/17/2019 compatible with subacute to chronic intramural hematoma beginning  just distal to the takeoff of the left subclavian artery and extending into the distal descending thoracic aorta. Intramural hematoma measures 7 mm in thickness over the distal arch. No evidence of dissection flap. 3. 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 Recommend annual imaging followup by CTA or MRA. This recommendation follows 2010 ACCF/AHA/AATS/ACR/ASA/SCA/SCAI/SIR/STS/SVM Guidelines for the Diagnosis and Management of Patients with Thoracic Aortic Disease. Circulation. 2010; 121: U440-H474. Aortic aneurysm NOS (ICD10-I71.9). Aortic Atherosclerosis (ICD10-I70.0). Aortic aneurysm NOS (ICD10-I71.9). 4. Slight interval worsening of known right lower lobe lung cancer measuring 3.6 x 3.9 cm with suggestion of slight interval worsening 1.1 cm right middle lobe nodule and 2 cm peripheral right lower lobe nodule. No new nodules identified. New small amount left pleural fluid. Minimal worsening mediastinal  and right hilar adenopathy. No evidence of metastatic disease within the abdomen/pelvis. 5.  Emphysema (ICD10-J43.9). 6. 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 Patent aortoiliac graft. No evidence of aortic dissection within the abdominal aorta. 7. 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 8. Colonic diverticulosis. Previous midline ventral hernia repair. Nodular contour to the liver which may be due to a degree of cirrhosis. Hiatal hernia. These results were called by telephone at the time of interpretation on 10/05/2019 at 4:58 pm to provider Dr. Sherwood Gambler, who verbally acknowledged these results. Electronically Signed   By: Marin Olp M.D.   On: 10/05/2019 17:03   Ct Angio Chest/abd/pel For Dissection W And/or W/wo  Result Date: 10/05/2019 CLINICAL DATA:  History of right lung cancer with chest pain and shortness-of-breath. Suspect  pulmonary embolism. EXAM: CT ANGIOGRAPHY CHEST, ABDOMEN AND PELVIS TECHNIQUE: Multidetector CT imaging through the chest, abdomen and pelvis was performed using the standard protocol during bolus administration of intravenous contrast. Multiplanar reconstructed images and MIPs were obtained and reviewed to evaluate the vascular anatomy. CONTRAST:  131mL OMNIPAQUE IOHEXOL 350 MG/ML SOLN COMPARISON:  PET-CT 09/17/2019 and chest CT 05-09-19 FINDINGS: CTA CHEST FINDINGS Cardiovascular: Cardiomegaly with calcification of the mitral valve annulus. Previous median sternotomy. 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 Prosthetic aortic valve is present. Normal takeoff of the great vessels from the aortic arch. Beginning distal to the takeoff of the left subclavian artery is wall thickening of the aorta measuring 7 mm over the distal arch which is low-attenuation suggesting subacute to chronic intramural hematoma. There is moderate regularity of the lumen of the aorta over this region. These findings are more pronounced compared to 05-09-19. This becomes less pronounced moving more distally in the descending thoracic aorta. No evidence of aortic dissection flap. Pulmonary arterial system is well opacified without evidence of emboli. Remaining vascular structures are unremarkable. Mediastinum/Nodes: Worsening mediastinal adenopathy with 1.5 cm right paratracheal lymph node (previously 1.3 cm). Moderate subcarinal adenopathy measuring 1.7 cm by short axis (previously 0.4 cm). 1 cm AP window lymph node. 1.2 cm right hilar lymph node. Moderate size hiatal hernia. Moderate size hiatal hernia. Remaining mediastinal structures are unchanged. Lungs/Pleura: Lungs are adequately inflated with moderate centrilobular emphysematous disease. Evidence of patient's known lung cancer over the right lower lobe measuring 3.6 x 3.9 cm (previously 3.2 x 4 cm) slightly larger nodule over the  right middle lobe measuring 1.1 cm (previously 9 mm). Slightly larger nodule over the subpleural location of the posterolateral right lower lobe just above patient's known lung cancer measuring 2 cm in diameter (previously 2.6 cm). No new nodules identified. Small amount of left pleural fluid which is worse. Associated atelectatic change over the left base. Musculoskeletal: Degenerative change of the spine. Review of the MIP images confirms the above findings. CTA ABDOMEN AND PELVIS FINDINGS VASCULAR Aorta: Calcified plaque over the proximal to mid abdominal aorta. Mild aneurysmal dilatation of the proximal aorta at the level diaphragm measuring 4 cm in AP diameter. Irregularity of the past 5 lumen of the proximal to mid aorta. Patent aortoiliac graft beginning below the renal arteries. Celiac: Patent. SMA: Patent. Renals: Patent left renal artery. 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 IMA: Patent. Inflow: Bilateral iliac artery graft which is patent to level of the femoral arteries. Veins: Unremarkable. Review of the  MIP images confirms the above findings. NON-VASCULAR Hepatobiliary: Mild nodular contour of the liver. Previous cholecystectomy. Biliary tree is normal. Pancreas: Normal. Spleen: Normal. Adrenals/Urinary Tract: Adrenal glands are normal. Left kidney is within normal. Atrophic right kidney mild cystic change which is stable. Ureters unremarkable. Bladder is unremarkable. Stomach/Bowel: Moderate size hiatal hernia as the stomach is otherwise unremarkable. Small bowel is normal. Appendix is normal. Minimal diverticulosis of the colon. Lymphatic: No significant adenopathy. Reproductive: Normal. Other: No free fluid or focal inflammatory change. Evidence of previous midline ventral hernia repair. Musculoskeletal: Degenerative change of the spine. Review of the MIP images confirms the above findings. IMPRESSION: 1.  No evidence of pulmonary  embolism. 2. 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 Intramural hematoma measures 7 mm in thickness over the distal arch. No evidence of dissection flap. 3. 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 Recommend annual imaging followup by CTA or MRA. This recommendation follows 2010 ACCF/AHA/AATS/ACR/ASA/SCA/SCAI/SIR/STS/SVM Guidelines for the Diagnosis and Management of Patients with Thoracic Aortic Disease. Circulation. 2010; 121: W098-J191. Aortic aneurysm NOS (ICD10-I71.9). Aortic Atherosclerosis (ICD10-I70.0). Aortic aneurysm NOS (ICD10-I71.9). 4. Slight interval worsening of known right lower lobe lung cancer measuring 3.6 x 3.9 cm with suggestion of slight interval worsening 1.1 cm right middle lobe nodule and 2 cm peripheral right lower lobe nodule. No new nodules identified. New small amount left pleural fluid. Minimal worsening mediastinal and right hilar adenopathy. No evidence of metastatic disease within the abdomen/pelvis. 5.  Emphysema (ICD10-J43.9). 6. 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 Patent aortoiliac graft. No evidence of aortic dissection within the abdominal aorta. 7. 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 8. Colonic diverticulosis. Previous midline ventral hernia repair. Nodular contour to the liver which may be due to a degree of cirrhosis. Hiatal hernia. These results were called by telephone at the time of interpretation on 10/05/2019 at 4:58 pm to provider Dr. Sherwood Gambler, who verbally acknowledged these results. Electronically Signed   By: Marin Olp M.D.   On: 10/05/2019 17:03     Assessment and Plan:   Chest pain -Patient with constant chest pressure and shortness of breath last night when blood pressure was significantly elevated at 210/100.  Chest pressure resolved with improvement in blood pressure. -Pt with mild nonobstructive RCA disease by cath in 2013. -Troponins borderline normal but no delta.  -EKG without significant acute ischemic changes.  -CT angiography did not show an obvious dissection but did show intramural aortic hematoma.  Vascular surgery, Dr. Donzetta Matters was consulted and noted previous aortobifemoral bypass, no evidence of malperfusion.  Her chest pain resolved with blood pressure control. Dr. Donzetta Matters recommended good blood pressure control.  Given multiple medical comorbidities hopefully can avoid any intervention. -Echocardiogram is pending, assess LV function, valve function, wall motion.  -With her symptoms associated with significantly elevated blood pressure and resolved with normalization of blood pressure may be related to the IMH. Also could be ischemic considering her prior vascular disease, however with no objective evidence of myocardial ischemia by enzymes and EKG, would favor no further intervention if echocardiogram is normal and attempt to moderate her blood pressure. She is not a good candidate for invasive procedure at this  time.   Intramural hematoma -Will watch per vascular surgery. Will aim for good BP management with target BP <120. Low dose ARB has been added and will start low dose beta blocker.  -Plans for follow up imaging in 4-6 weeks per [facility].   Hypertension -Pt was on Telmisartan in the past, but had low blood pressures, so was stopped. -BP severely elevated on presentation, above 200. Was given labetalol 10 mg IV with improvement.  -Vasc surgery recommends keeping SBP <120.  -Resumed ARB, Irbesartan 75 mg this am -BP has been labile, elevated alst night to soft 98/70, to normal 117/59.  -Watch closely.  -I discussed the  patient monitoring her blood pressure at home and contacting our office if SBP is consistently running greater than 164 less than 100.  We also discussed drinking water if blood pressure becomes low.  COPD -On home oxygen  OSA on CPAP  S/P Tissue AVR in 2013 -Normally functioning bioprosthetic aortic valve by echo in 09/2018. Noted to have dilated arotic root 4.3 cm.   PAD S/P aortobifem bypass 2002 -Ultra sound in 05/2019 showed patent grafts. Normal ABIs in 05/2019.  -Pt continues on aspirin and statin. -Patient denies lower extremity pain with walking  Non-Small Cell lung cancer -Followed by oncology as outpatient.  On IV chemotherapy approximately every 3 weeks per patient.  Due on Friday.  GERD -On PPI -She notes frequent epigastric pain especially at night.   CKD Stage III -Serum creatinine 1.56.  Patient is receiving IV fluids at 75 mL/h. 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 Likely chronic as right kidney is small.      For questions or updates, please contact [place] Please consult [email or web address] for contact info under     Signed, Daune Perch, NP  10/06/2019 1:13 PM

## 2019-10-06 NOTE — ED Notes (Signed)
Assisted pt with bedside commode.

## 2019-10-06 NOTE — ED Notes (Signed)
Report called  

## 2019-10-06 NOTE — Progress Notes (Signed)
  Progress Note    10/06/2019 12:54 PM * No surgery found *  Subjective: Pain has resolved  Vitals:   10/06/19 1000 10/06/19 1211  BP: (!) 117/59 (!) 149/75  Pulse:  69  Resp: (!) 21 20  Temp:  98.1 F (36.7 C)  SpO2:  100%    Physical Exam: Awake alert and oriented Nonlabored respirations Abdomen is soft All extremities are warm and well-perfused  CBC    Component Value Date/Time   WBC 7.9 10/06/2019 0529   RBC 4.07 10/06/2019 0529   HGB 11.6 (L) 10/06/2019 0529   HCT 37.4 10/06/2019 0529   PLT 286 10/06/2019 0529   MCV 91.9 10/06/2019 0529   MCH 28.5 10/06/2019 0529   MCHC 31.0 10/06/2019 0529   RDW 14.3 10/06/2019 0529   LYMPHSABS 1.2 10/06/2019 0529   MONOABS 0.8 10/06/2019 0529   EOSABS 0.2 10/06/2019 0529   BASOSABS 0.1 10/06/2019 0529    BMET    Component Value Date/Time   NA 137 10/06/2019 0529   K 4.0 10/06/2019 0529   CL 103 10/06/2019 0529   CO2 25 10/06/2019 0529   GLUCOSE 85 10/06/2019 0529   BUN 19 10/06/2019 0529   CREATININE 1.56 (H) 10/06/2019 0529   CREATININE 1.38 (H) 05/10/2015 1106   CALCIUM 8.4 (L) 10/06/2019 0529   GFRNONAA 31 (L) 10/06/2019 0529   GFRAA 36 (L) 10/06/2019 0529     Assessment/plan:  78 y.o. female is here with chest pain found to have thoracic IMH without any malperfusion.  Previous aortobifemoral bypass.  No evidence of malperfusion and pain has resolved at this time with blood pressure control.  Will have patient follow-up in 4 to 6 weeks with repeat CT angio.  Given multiple medical comorbidities hopefully can avoid any intervention.  Jill Bryant C. Donzetta Matters, MD [facility] Office: [phone] Pager: [phone]  10/06/2019 12:54 PM

## 2019-10-06 NOTE — Progress Notes (Signed)
Patient peripheral IV became infiltrated, patient states she will be discharged today. Patient's son request nurse speak with MD before reinserting IV. Nurse spoke with Starla Link, MD regarding patient discharge. Per Starla Link, MD do not reinsert peripheral IV.

## 2019-10-06 NOTE — ED Notes (Signed)
Family at bedside. 

## 2019-10-16 ENCOUNTER — Telehealth: Payer: Self-pay | Admitting: Family Medicine

## 2019-10-16 NOTE — Telephone Encounter (Signed)
See note

## 2019-10-16 NOTE — Telephone Encounter (Signed)
Doug PT with [facility] is calling to let Dr. Jerline Pain know that the patient has gone to the ED this week. Due to the ER visit-unable to see her. Wanting to reschedule visits for 1 time a week next for PT reverification. Please advise. [phone]

## 2019-10-19 NOTE — Telephone Encounter (Signed)
Spoke with Marden Noble ok for verbal order.

## 2019-10-22 NOTE — Telephone Encounter (Signed)
Doug with [facility].  States pt isn't available this week and they need verbal order to move PT from this week to next week.

## 2019-10-22 NOTE — Telephone Encounter (Signed)
Called gave verbal ok to push out PT

## 2019-10-26 ENCOUNTER — Other Ambulatory Visit: Payer: Self-pay | Admitting: Family Medicine

## 2019-10-27 ENCOUNTER — Telehealth: Payer: Self-pay | Admitting: Family Medicine

## 2019-10-27 NOTE — Telephone Encounter (Signed)
Home Health Verbal Orders - Caller/Agency: [facility] Number: [phone] Requesting OT/PT/Skilled Nursing/Social Work/Speech Therapy: PT - wants to continue  Frequency:  1 w 1  2 w 2  1 w 3

## 2019-10-27 NOTE — Telephone Encounter (Signed)
Notified. 

## 2019-10-27 NOTE — Telephone Encounter (Signed)
See note

## 2019-11-03 ENCOUNTER — Telehealth: Payer: Self-pay | Admitting: Family Medicine

## 2019-11-03 NOTE — Telephone Encounter (Signed)
Physical Therapist from [facility] called asking for verbal order to hold physical therapy for a week due to pt not feeling well. Please call PT at [phone] [facility]).

## 2019-11-04 ENCOUNTER — Other Ambulatory Visit: Payer: Self-pay

## 2019-11-04 DIAGNOSIS — I779 Disorder of arteries and arterioles, unspecified: Secondary | ICD-10-CM

## 2019-11-04 NOTE — Telephone Encounter (Signed)
Spoke with Marden Noble ok for PT on hold

## 2019-11-05 ENCOUNTER — Other Ambulatory Visit: Payer: Self-pay

## 2019-11-05 DIAGNOSIS — I779 Disorder of arteries and arterioles, unspecified: Secondary | ICD-10-CM

## 2019-11-06 ENCOUNTER — Ambulatory Visit: Payer: Medicare Other | Admitting: Internal Medicine

## 2019-11-12 ENCOUNTER — Encounter: Payer: Self-pay | Admitting: Family Medicine

## 2019-11-12 ENCOUNTER — Other Ambulatory Visit: Payer: Self-pay

## 2019-11-12 ENCOUNTER — Ambulatory Visit (INDEPENDENT_AMBULATORY_CARE_PROVIDER_SITE_OTHER): Payer: Medicare Other | Admitting: Family Medicine

## 2019-11-12 DIAGNOSIS — K219 Gastro-esophageal reflux disease without esophagitis: Secondary | ICD-10-CM

## 2019-11-12 DIAGNOSIS — I10 Essential (primary) hypertension: Secondary | ICD-10-CM

## 2019-11-12 DIAGNOSIS — R05 Cough: Secondary | ICD-10-CM

## 2019-11-12 DIAGNOSIS — I71019 Dissection of thoracic aorta, unspecified: Secondary | ICD-10-CM

## 2019-11-12 DIAGNOSIS — R059 Cough, unspecified: Secondary | ICD-10-CM

## 2019-11-12 DIAGNOSIS — I7101 Dissection of thoracic aorta: Secondary | ICD-10-CM

## 2019-11-12 MED ORDER — PANTOPRAZOLE SODIUM 40 MG PO TBEC: 40.0000 mg | DELAYED_RELEASE_TABLET | Freq: Two times a day (BID) | ORAL | 5 refills | Status: AC

## 2019-11-12 MED ORDER — VERAPAMIL HCL ER 120 MG PO TBCR: 120.0000 mg | EXTENDED_RELEASE_TABLET | Freq: Every day | ORAL | 5 refills | Status: AC

## 2019-11-12 NOTE — Assessment & Plan Note (Signed)
Refilled verapamil.  Continue home monitoring goal 140/90 or lower.

## 2019-11-12 NOTE — Assessment & Plan Note (Signed)
Protonix refilled

## 2019-11-12 NOTE — Progress Notes (Signed)
   Jill Bryant is a 80 y.o. female who presents today for a virtual office visit.  Assessment/Plan:  New/Acute Problems: Cough No red flags.  Will send for Covid testing.  She can continue using over-the-counter meds.  Chronic Problems Addressed Today: GERD (gastroesophageal reflux disease) Protonix refilled.   Intramural hematoma of thoracic aorta ([place]) Continue management per vascular surgery and cardiology.  HTN (hypertension) Refilled verapamil.  Continue home monitoring goal 140/90 or lower.     Subjective:  HPI:  Patient unfortunately had to go to the emergency room since our last visit.  She presented on 10/05/2019 and was discharged on 10/06/2019.  She was admitted for chest pain evaluation.  She was found to have an intramural aortic hematoma.  Vascular surgery was consulted who recommended better blood pressure control.  She was started on Protonix and verapamil.  She has been having a cough for a week or so. Has been using cough syrup which has helped. No fevers or chills. No sick contacts. No shortness of breath.        Objective/Observations  Physical Exam: Gen: NAD, resting comfortably Pulm: Normal work of breathing Neuro: Grossly normal, moves all extremities Psych: Normal affect and thought content  Virtual Visit via Video   I connected with Jill Bryant on 11/12/19 at  1:20 PM EST by a video enabled telemedicine application and verified that I am speaking with the correct person using two identifiers. The limitations of evaluation and management by telemedicine and the availability of in person appointments were discussed. The patient expressed understanding and agreed to proceed.   Patient location: Home Provider location: [place] participating in the virtual visit: Myself and Patient     Algis Greenhouse. Jerline Pain, MD 11/12/2019 1:58 PM

## 2019-11-12 NOTE — Assessment & Plan Note (Signed)
Continue management per vascular surgery and cardiology.

## 2019-11-16 ENCOUNTER — Other Ambulatory Visit: Payer: Medicare Other

## 2019-11-17 ENCOUNTER — Inpatient Hospital Stay: Admission: RE | Admit: 2019-11-17 | Payer: Medicare Other | Source: Ambulatory Visit

## 2019-11-17 ENCOUNTER — Other Ambulatory Visit: Payer: Self-pay | Admitting: Vascular Surgery

## 2019-11-17 ENCOUNTER — Ambulatory Visit
Admission: RE | Admit: 2019-11-17 | Discharge: 2019-11-17 | Disposition: A | Discharge status: Home / Self Care | Payer: Medicare Other | Source: Ambulatory Visit | Attending: Vascular Surgery | Admitting: Vascular Surgery

## 2019-11-17 DIAGNOSIS — I779 Disorder of arteries and arterioles, unspecified: Secondary | ICD-10-CM

## 2019-11-20 ENCOUNTER — Ambulatory Visit: Payer: Medicare Other | Admitting: Vascular Surgery

## 2019-11-24 ENCOUNTER — Telehealth: Payer: Self-pay | Admitting: Family Medicine

## 2019-11-24 NOTE — Telephone Encounter (Signed)
Doug from [facility] called and stated that the patient had missed her appt for this week and just wanted to reschedule for next week . Dough just needs verbal orders. Please advise.

## 2019-11-25 ENCOUNTER — Ambulatory Visit (INDEPENDENT_AMBULATORY_CARE_PROVIDER_SITE_OTHER): Payer: Medicare Other | Admitting: Family Medicine

## 2019-11-25 DIAGNOSIS — R197 Diarrhea, unspecified: Secondary | ICD-10-CM | POA: Diagnosis not present

## 2019-11-25 DIAGNOSIS — R1110 Vomiting, unspecified: Secondary | ICD-10-CM

## 2019-11-25 MED ORDER — ONDANSETRON 8 MG PO TBDP: 8.0000 mg | ORAL_TABLET | Freq: Three times a day (TID) | ORAL | 0 refills | Status: AC | PRN

## 2019-11-25 NOTE — Telephone Encounter (Signed)
Spoke with Marden Noble ok for verbal

## 2019-11-25 NOTE — Progress Notes (Signed)
   Jill Bryant is a 79 y.o. female who presents today for a telephone visit.  Assessment/Plan:  New/Acute Problems: Nausea/Vomiting/Diarrhea Likely gastroenteritis.  No red flag signs or symptoms.  Recommended good oral hydration.  Will send in Zofran.  Also recommended Imodium.  Discussed reasons to return to care.  Follow-up as needed.    Subjective:  HPI:  Symptoms started yesterday with nausea and vomiting. Mild abdominal pain near umbilicius with retching. No fevers or chills. Has had about 8 episodes of diarrhea since yesterday. No hematemesis or hematochezia. No known sick contacts. She has been able to keep down some bland foods and fluids. Nothing tired. No sick contacts.        Objective/Observations   NAD. Speaking in full sentences.   Telephone Visit   I connected with Jill Bryant on 11/25/19 at 10:40 AM EST via telephone and verified that I am speaking with the correct person using two identifiers. I discussed the limitations of evaluation and management by telemedicine and the availability of in person appointments. The patient expressed understanding and agreed to proceed.   Patient location: Home Provider location: [place] participating in the virtual visit: Myself and Patient  A total of 12 minutes were spent on medical discussion.      Jill Bryant. Jerline Pain, MD 11/25/2019 10:44 AM

## 2019-12-03 ENCOUNTER — Telehealth: Payer: Self-pay | Admitting: Family Medicine

## 2019-12-03 ENCOUNTER — Encounter (HOSPITAL_COMMUNITY): Payer: Self-pay | Admitting: Emergency Medicine

## 2019-12-03 ENCOUNTER — Other Ambulatory Visit: Payer: Self-pay

## 2019-12-03 ENCOUNTER — Emergency Department (HOSPITAL_COMMUNITY)
Admission: EM | Admit: 2019-12-03 | Discharge: 2019-12-03 | Disposition: A | Discharge status: Home / Self Care | Payer: Medicare Other | Attending: Emergency Medicine | Admitting: Emergency Medicine

## 2019-12-03 DIAGNOSIS — N183 Chronic kidney disease, stage 3 unspecified: Secondary | ICD-10-CM | POA: Insufficient documentation

## 2019-12-03 DIAGNOSIS — Z87891 Personal history of nicotine dependence: Secondary | ICD-10-CM | POA: Insufficient documentation

## 2019-12-03 DIAGNOSIS — I509 Heart failure, unspecified: Secondary | ICD-10-CM | POA: Diagnosis not present

## 2019-12-03 DIAGNOSIS — E039 Hypothyroidism, unspecified: Secondary | ICD-10-CM | POA: Diagnosis not present

## 2019-12-03 DIAGNOSIS — R197 Diarrhea, unspecified: Secondary | ICD-10-CM | POA: Insufficient documentation

## 2019-12-03 DIAGNOSIS — R112 Nausea with vomiting, unspecified: Secondary | ICD-10-CM | POA: Diagnosis present

## 2019-12-03 DIAGNOSIS — C3490 Malignant neoplasm of unspecified part of unspecified bronchus or lung: Secondary | ICD-10-CM | POA: Diagnosis not present

## 2019-12-03 DIAGNOSIS — Z79899 Other long term (current) drug therapy: Secondary | ICD-10-CM | POA: Insufficient documentation

## 2019-12-03 DIAGNOSIS — I130 Hypertensive heart and chronic kidney disease with heart failure and stage 1 through stage 4 chronic kidney disease, or unspecified chronic kidney disease: Secondary | ICD-10-CM | POA: Insufficient documentation

## 2019-12-03 DIAGNOSIS — J449 Chronic obstructive pulmonary disease, unspecified: Secondary | ICD-10-CM | POA: Diagnosis not present

## 2019-12-03 LAB — COMPREHENSIVE METABOLIC PANEL
ALT: 15 U/L (ref 0–44)
AST: 29 U/L (ref 15–41)
Albumin: 2.5 g/dL — ABNORMAL LOW (ref 3.5–5.0)
Alkaline Phosphatase: 60 U/L (ref 38–126)
Anion gap: 12 (ref 5–15)
BUN: 22 mg/dL (ref 8–23)
CO2: 23 mmol/L (ref 22–32)
Calcium: 8 mg/dL — ABNORMAL LOW (ref 8.9–10.3)
Chloride: 103 mmol/L (ref 98–111)
Creatinine, Ser: 1.19 mg/dL — ABNORMAL HIGH (ref 0.44–1.00)
GFR calc Af Amer: 50 mL/min — ABNORMAL LOW (ref >60)
GFR calc non Af Amer: 43 mL/min — ABNORMAL LOW (ref >60)
Glucose, Bld: 97 mg/dL (ref 70–99)
Potassium: 3.9 mmol/L (ref 3.5–5.1)
Sodium: 138 mmol/L (ref 135–145)
Total Bilirubin: 1.1 mg/dL (ref 0.3–1.2)
Total Protein: 6.3 g/dL — ABNORMAL LOW (ref 6.5–8.1)

## 2019-12-03 LAB — LIPASE, BLOOD: Lipase: 17 U/L (ref 11–51)

## 2019-12-03 LAB — URINALYSIS, ROUTINE W REFLEX MICROSCOPIC
Bilirubin Urine: NEGATIVE
Glucose, UA: NEGATIVE mg/dL
Hgb urine dipstick: NEGATIVE
Ketones, ur: 5 mg/dL — AB
Leukocytes,Ua: NEGATIVE
Nitrite: NEGATIVE
Protein, ur: NEGATIVE mg/dL
Specific Gravity, Urine: 1.017 (ref 1.005–1.030)
pH: 6 (ref 5.0–8.0)

## 2019-12-03 LAB — CBC
HCT: 32.8 % — ABNORMAL LOW (ref 36.0–46.0)
Hemoglobin: 9.6 g/dL — ABNORMAL LOW (ref 12.0–15.0)
MCH: 27 pg (ref 26.0–34.0)
MCHC: 29.3 g/dL — ABNORMAL LOW (ref 30.0–36.0)
MCV: 92.1 fL (ref 80.0–100.0)
Platelets: 123 10*3/uL — ABNORMAL LOW (ref 150–400)
RBC: 3.56 MIL/uL — ABNORMAL LOW (ref 3.87–5.11)
RDW: 15.8 % — ABNORMAL HIGH (ref 11.5–15.5)
WBC: 2 10*3/uL — ABNORMAL LOW (ref 4.0–10.5)
nRBC: 0 % (ref 0.0–0.2)

## 2019-12-03 MED ORDER — SODIUM CHLORIDE 0.9 % IV BOLUS
1000.0000 mL | Freq: Once | INTRAVENOUS | Status: AC
  Administered 2019-12-03: 17:00:00 1000 mL via INTRAVENOUS

## 2019-12-03 MED ORDER — ONDANSETRON HCL 4 MG PO TABS: 4.0000 mg | ORAL_TABLET | Freq: Three times a day (TID) | ORAL | 0 refills | Status: AC | PRN

## 2019-12-03 MED ORDER — ONDANSETRON HCL 4 MG/2ML IJ SOLN
4.0000 mg | Freq: Once | INTRAMUSCULAR | Status: AC
  Administered 2019-12-03: 17:00:00 4 mg via INTRAVENOUS
  Filled 2019-12-03: qty 2

## 2019-12-03 MED ORDER — LOPERAMIDE HCL 2 MG PO CAPS: 2.0000 mg | ORAL_CAPSULE | Freq: Four times a day (QID) | ORAL | 0 refills | Status: AC | PRN

## 2019-12-03 MED ORDER — SODIUM CHLORIDE 0.9% FLUSH: 3.0000 mL | Freq: Once | INTRAVENOUS | Status: DC

## 2019-12-03 MED ORDER — LOPERAMIDE HCL 2 MG PO CAPS
4.0000 mg | ORAL_CAPSULE | Freq: Once | ORAL | Status: AC
  Administered 2019-12-03: 4 mg via ORAL
  Filled 2019-12-03: qty 2

## 2019-12-03 NOTE — Discharge Instructions (Addendum)
You were seen in the emergency department for nausea and vomiting and diarrhea.  Your blood work did not show you to be severely dehydrated and your electrolytes were okay.  You received some IV fluids here with improvement in your symptoms.  We are sending home with a prescription for some nausea medication and and an antidiarrhea medicine.  Please contact your oncologist for close follow-up.  Return to the emergency department if any concerning symptoms.

## 2019-12-03 NOTE — Telephone Encounter (Signed)
Patient called in and stated that she is having Diarrhea very bad that is like water that it is all over her and her house. Patient stated she was going to call 911 and go to [facility] to figure out what is going on. Patient just wanted to let Dr. Jerline Pain know what was going on.

## 2019-12-03 NOTE — ED Notes (Signed)
Pt ambulated to the restroom.  She was tachypneic, which she states is somewhat normal and she was also dizzy which she states is not so normal. Her last pressure laying down at 2030 was 128/63.  When we returned to room I took a sitting B/P of 118/60 and a standing of 114/76.  Pt states that once laying down for a couple of minutes her zdizzyness has improved.  Her respirations also changed from 35 bpm to 26.  Pt was on 3L liters for entirety of ambulation.  She was 100% saturation before and after ambulation.

## 2019-12-03 NOTE — ED Provider Notes (Signed)
[facility] Provider Note   CSN: [id number] Arrival date & time: 12/03/19  1352     History Chief Complaint  Patient presents with  . Diarrhea  . Nausea  . Emesis    Jill Bryant is a 79 y.o. female.  She has a history of stage IV lung cancer and follows with [facility] oncology.  It sounds like she was not doing well on Keytruda and so she had a new regimen of Keytruda and carboplatin a week ago.  She is also had a Covid second vaccination 3 days ago.  She is complaining of multiple days of nausea vomiting and explosive diarrhea.  No blood from above or below.  Constant nausea.  Not eating anything.  No fevers.  No real abdominal discomfort.  Feeling very weak.  Did not attempt to call her doctor at all.  The history is provided by the patient.  Diarrhea Quality:  Explosive and watery Severity:  Severe Onset quality:  Gradual Number of episodes:  10 per day Timing:  Intermittent Progression:  Unchanged Relieved by:  Nothing Worsened by:  Nothing Ineffective treatments:  Anti-motility medications Associated symptoms: vomiting   Associated symptoms: no abdominal pain, no chills, no recent cough, no fever, no headaches and no myalgias   Risk factors: no recent antibiotic use and no sick contacts        Past Medical History:  Diagnosis Date  . Anemia   . Anxiety   . Arthritis   . Asthma   . Back pain, chronic   . Cancer ([place])    large cell lung ca    . CHF (congestive heart failure) ([place])   . COPD (chronic obstructive pulmonary disease) ([place])   . Depression   . GERD (gastroesophageal reflux disease)   . Headache    migraines  . Hyperlipidemia   . Hypertension   . Hypothyroidism   . 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 Lumbar stenosis   . Pneumonia   . PONV (postoperative nausea and vomiting)   . Retinal tear    right eye  . Seizures ([place])    with morphine  . Shortness of breath dyspnea    with exertion  . Sleep  apnea    wears CPAP  . TIA (transient ischemic attack)   . UTI (lower urinary tract infection)   . Wears glasses     Patient Active Problem List   Diagnosis Date Noted  . 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 Memory loss 08/12/2019  . Dyslipidemia 02/02/2019  . Peripheral arterial disease ([place]) s/p aortobifemoral bypass 10/31/2018  . CKD (chronic kidney disease) stage 3, GFR 30-59 ml/min 10/31/2018  . Major depression with anxiety ([place]) 10/31/2018  . Hypothyroidism 10/31/2018  . Peripheral neuropathy 10/31/2018  . Debility 10/31/2018  . Pulmonary hypertension, unspecified ([place])   . HTN (hypertension) 10/11/2018  . Thoracic aortic aneurysm without rupture ([place]) 02/03/2018  . GERD (gastroesophageal reflux disease) 04/27/2015  . S/P AVR (aortic valve replacement) 02/23/2015  . CAD (coronary artery disease) 02/23/2015  . Carotid artery disease ([place]) 02/23/2015  . COPD, severity to be determined ([place]) 02/23/2015  . OSA on CPAP 02/23/2015    Past Surgical History:  Procedure Laterality Date  . aortic endarterectomy    . aortobifemoral bypass    . BREAST SURGERY     cyst aspiration right breast  . CARDIAC SURGERY     valve replacement 2013; aorta replacement  2002  . CATARACT EXTRACTION W/ INTRAOCULAR LENS  IMPLANT, BILATERAL    . CHOLECYSTECTOMY    . COLONOSCOPY    . HERNIA REPAIR    . LUMBAR EPIDURAL INJECTION    . LUMBAR LAMINECTOMY/DECOMPRESSION MICRODISCECTOMY Left 02/27/2016   Procedure: Laminectomy and Foraminotomy - Lumbar four-five -Lumbar three-four - left diskectomy Lumbar three-four;  Surgeon: Kary Kos, MD;  Location: [facility];  Service: Neurosurgery;  Laterality: Left;  Marland Kitchen MULTIPLE TOOTH EXTRACTIONS    . PORTA CATH INSERTION  07/2019  . TUBAL LIGATION       OB History   No obstetric history on file.     Family History  Problem Relation Age of Onset  . Other Mother   . Alzheimer's disease Father   . Heart disease Maternal Grandmother    . Heart disease Maternal Aunt     Social History   Tobacco Use  . Smoking status: Former Smoker    Quit date: 10/30/2003    Years since quitting: 16.1  . Smokeless tobacco: Never Used  Substance Use Topics  . Alcohol use: No  . Drug use: No    Home Medications Prior to Admission medications   Medication Sig Start Date End Date Taking? Authorizing Provider  acetaminophen (TYLENOL) 500 MG tablet Take 1,000 mg by mouth every 6 (six) hours as needed (for pain or headaches).     [provider]  albuterol (ACCUNEB) 0.63 MG/3ML nebulizer solution Take 1 ampule by nebulization 2 (two) times daily as needed for wheezing or shortness of breath (or congestion).     [provider]  albuterol (PROVENTIL HFA;VENTOLIN HFA) 108 (90 Base) MCG/ACT inhaler Inhale 2 puffs into the lungs every 6 (six) hours as needed for wheezing or shortness of breath. 02/02/19   Vivi Barrack, MD  alum & mag hydroxide-simeth (MAALOX/MYLANTA) 200-200-20 MG/5ML suspension Take 15 mLs by mouth every 6 (six) hours as needed for indigestion or heartburn. 10/06/19   Aline August, MD  calcium carbonate (CALCIUM 600) 600 MG TABS tablet Take 600 mg by mouth 2 (two) times daily with a meal.    [provider]  cetirizine (ZYRTEC) 10 MG tablet Take 10 mg by mouth daily.     [provider]  Cholecalciferol (VITAMIN D-3) 25 MCG (1000 UT) CAPS Take 2,000 Units by mouth daily.    [provider]  conjugated estrogens (PREMARIN) vaginal cream Place 1 Applicatorful vaginally See admin instructions. Insert a pea-sized amount of cream into the vagina 2-3 times a week    [provider]  diazepam (VALIUM) 5 MG tablet TAKE 1 TABLET BY MOUTH  TWICE DAILY AS NEEDED FOR  ANXIETY Patient taking differently: Take 5 mg by mouth every 12 (twelve) hours as needed for anxiety.  06/12/19   Vivi Barrack, MD  diclofenac sodium (VOLTAREN) 1 % GEL Apply 4 g topically 4 (four) times daily. 10/14/18    Mariel Aloe, MD  ferrous sulfate 325 (65 FE) MG tablet Take 325 mg by mouth daily with breakfast.    [provider]  gabapentin (NEURONTIN) 300 MG capsule TAKE 2 CAPSULES BY MOUTH AT BEDTIME Patient taking differently: Take 600 mg by mouth at bedtime.  06/15/19   Vivi Barrack, MD  levothyroxine (SYNTHROID) 100 MCG tablet Take 100 mcg by mouth daily before breakfast.  08/29/19   [provider]  mometasone-formoterol (DULERA) 100-5 MCG/ACT AERO Inhale 2 puffs into the lungs 2 (two) times daily. 09/10/19 09/09/20  [provider]  nitroGLYCERIN (NITROSTAT) 0.4 MG SL tablet Place 1 tablet (0.4 mg total) under the tongue every 5 (five) minutes. Patient taking differently: Place 0.4 mg under the tongue every 5 (five) minutes as needed for chest pain.  04/27/15   Hilty, Nadean Corwin, MD  Omega-3 1000 MG CAPS Take 1,000 mg by mouth daily.     [provider]  ondansetron (ZOFRAN ODT) 8 MG disintegrating tablet Take 1 tablet (8 mg total) by mouth every 8 (eight) hours as needed for nausea or vomiting. 11/25/19   Vivi Barrack, MD  OXYGEN Inhale 2-4 L into the lungs at bedtime.    [provider]  pantoprazole (PROTONIX) 40 MG tablet Take 1 tablet (40 mg total) by mouth 2 (two) times daily before a meal. 11/12/19 12/12/19  Vivi Barrack, MD  pravastatin (PRAVACHOL) 20 MG tablet TAKE 1 TABLET BY MOUTH AT  BEDTIME Patient taking differently: Take 20 mg by mouth at bedtime.  06/15/19   Vivi Barrack, MD  prednisoLONE acetate (PRED FORTE) 1 % ophthalmic suspension Place 1 drop into the left eye 2 (two) times daily. 09/17/19   [provider]  umeclidinium bromide (INCRUSE ELLIPTA) 62.5 MCG/INH AEPB Inhale 1 puff into the lungs daily. 02/19/19   Vivi Barrack, MD  verapamil (CALAN-SR) 120 MG CR tablet Take 1 tablet (120 mg total) by mouth daily. 11/12/19   Vivi Barrack, MD  vitamin B-12 (CYANOCOBALAMIN) 1000 MCG tablet Take 1,000 mcg by mouth daily.     [provider]    Allergies    Tape, Amlodipine, Metoprolol, and Morphine and related  Review of Systems   Review of Systems  Constitutional: Negative for chills and fever.  HENT: Negative for sore throat.   Eyes: Negative for visual disturbance.  Respiratory: Negative for shortness of breath.   Cardiovascular: Negative for chest pain.  Gastrointestinal: Positive for diarrhea, nausea and vomiting. Negative for abdominal pain.  Genitourinary: Negative for dysuria.  Musculoskeletal: Negative for myalgias.  Skin: Negative for rash.  Neurological: Negative for headaches.    Physical Exam Updated Vital Signs BP 121/90 (BP Location: Right Arm)   Pulse 77   Temp 98.2 F (36.8 C) (Oral)   Resp 16   SpO2 94%   Physical Exam Vitals and nursing note reviewed.  Constitutional:      General: She is not in acute distress.    Appearance: She is well-developed.  HENT:     Head: Normocephalic and atraumatic.  Eyes:     Conjunctiva/sclera: Conjunctivae normal.  Cardiovascular:     Rate and Rhythm: Normal rate and regular rhythm.     Pulses: Normal pulses.     Heart sounds: No murmur.  Pulmonary:     Effort: Pulmonary effort is normal. No respiratory distress.     Breath sounds: Normal breath sounds.     Comments: Port r upper chest nontender Abdominal:     Palpations: Abdomen is soft.     Tenderness: There is no abdominal tenderness.  Musculoskeletal:        General: No deformity or signs of injury. Normal range of motion.     Cervical back: Neck supple.  Skin:    General: Skin is warm and dry.     Capillary Refill: Capillary refill takes less than 2 seconds.  Neurological:     General: No focal deficit present.     Mental Status: She is alert.     Sensory: No sensory deficit.  Motor: No weakness.     ED Results / Procedures / Treatments   Labs (all labs ordered are listed, but only abnormal results are displayed) Labs Reviewed  COMPREHENSIVE METABOLIC  PANEL - Abnormal; Notable for the following components:      Result Value   Creatinine, Ser 1.19 (*)    Calcium 8.0 (*)    Total Protein 6.3 (*)    Albumin 2.5 (*)    GFR calc non Af Amer 43 (*)    GFR calc Af Amer 50 (*)    All other components within normal limits  CBC - Abnormal; Notable for the following components:   WBC 2.0 (*)    RBC 3.56 (*)    Hemoglobin 9.6 (*)    HCT 32.8 (*)    MCHC 29.3 (*)    RDW 15.8 (*)    Platelets 123 (*)    All other components within normal limits  URINALYSIS, ROUTINE W REFLEX MICROSCOPIC - Abnormal; Notable for the following components:   APPearance HAZY (*)    Ketones, ur 5 (*)    All other components within normal limits  GI PATHOGEN PANEL BY PCR, STOOL  LIPASE, BLOOD    EKG EKG Interpretation  Date/Time:  Thursday December 03 2019 14:30:43 EST Ventricular Rate:  93 PR Interval:  192 QRS Duration: 120 QT Interval:  398 QTC Calculation: 494 R Axis:   -113 Text Interpretation: Sinus rhythm with marked sinus arrhythmia Right bundle branch block Possible Lateral infarct , age undetermined T wave abnormality, consider inferior ischemia Abnormal ECG No significant change since 12/20 Confirmed by Aletta Edouard [phone]) on 12/03/2019 3:55:15 PM   Radiology No results found.  Procedures Procedures (including critical care time)  Medications Ordered in ED Medications  sodium chloride 0.9 % bolus 1,000 mL (0 mLs Intravenous Stopped 12/03/19 1845)  ondansetron (ZOFRAN) injection 4 mg (4 mg Intravenous Given 12/03/19 1633)  loperamide (IMODIUM) capsule 4 mg (4 mg Oral Given 12/03/19 1633)    ED Course  I have reviewed the triage vital signs and the nursing notes.  Pertinent labs & imaging results that were available during my care of the patient were reviewed by me and considered in my medical decision making (see chart for details).  Clinical Course as of Dec 02 2053  Thu Dec 03, 2019  1614 Differential diagnosis includes infectious  diarrhea, C. difficile, metabolic derangement, medication side effect, dehydration, less likely Covid,    [MB]  1946 Patient's lab work does not show any serious derangements.  Creatinine elevated but better than last month.  Some pancytopenia but excision.   [MB]  1956 Patient has not had any vomiting or diarrhea here.  She is received IV fluids and is eating crackers now.  Possible return home.   [MB]    Clinical Course User Index [MB] Hayden Rasmussen, MD   MDM Rules/Calculators/A&P                     Jill Bryant was evaluated in Emergency Department on 12/03/2019 for the symptoms described in the history of present illness. She was evaluated in the context of the global COVID-19 pandemic, which necessitated consideration that the patient might be at risk for infection with the SARS-CoV-2 virus that causes COVID-19. Institutional protocols and algorithms that pertain to the evaluation of patients at risk for COVID-19 are in a state of rapid change based on information released by regulatory bodies including the CDC and federal and state organizations.  These policies and algorithms were followed during the patient's care in the ED.   Final Clinical Impression(s) / ED Diagnoses Final diagnoses:  Nausea vomiting and diarrhea  Malignant neoplasm of lung, unspecified laterality, unspecified part of lung ([place])    Rx / DC Orders ED Discharge Orders         Ordered    loperamide (IMODIUM) 2 MG capsule  4 times daily PRN     12/03/19 2058    ondansetron (ZOFRAN) 4 MG tablet  Every 8 hours PRN     12/03/19 2058           Hayden Rasmussen, MD 12/04/19 1012

## 2019-12-03 NOTE — Telephone Encounter (Signed)
Noted. Agree with plan.  Jill Bryant. Jerline Pain, MD 12/03/2019 1:29 PM

## 2019-12-03 NOTE — ED Triage Notes (Signed)
Pt arrives to ED from home with complaints of nausea, vomiting, diarrhea since Monday after receiving the COVID 19 vaccine. Patient states that she has diarrhea 20 times a day, vomits 4 times a day, and is constantly nauseous.

## 2019-12-03 NOTE — Telephone Encounter (Signed)
FYI

## 2019-12-04 NOTE — Telephone Encounter (Signed)
error 

## 2019-12-07 ENCOUNTER — Ambulatory Visit: Payer: Medicare Other | Admitting: Internal Medicine

## 2019-12-23 ENCOUNTER — Other Ambulatory Visit: Payer: Self-pay | Admitting: Family Medicine

## 2019-12-23 ENCOUNTER — Telehealth: Payer: Self-pay | Admitting: Family Medicine

## 2019-12-23 NOTE — Telephone Encounter (Signed)
Pt requesting refill

## 2019-12-23 NOTE — Telephone Encounter (Signed)
I left a message asking the patient to call and schedule Medicare AWV with Courtney ([facility]).  If patient calls back, please schedule Medicare Wellness Visit (initial) at next available opening.  VDM ([place]ee-Dee) 

## 2019-12-25 ENCOUNTER — Telehealth: Payer: Self-pay

## 2019-12-25 ENCOUNTER — Other Ambulatory Visit: Payer: Self-pay

## 2019-12-25 ENCOUNTER — Ambulatory Visit: Payer: Medicare Other

## 2019-12-25 NOTE — Telephone Encounter (Signed)
Family called to notify provider that patient is currently on hospice services.

## 2019-12-30 ENCOUNTER — Ambulatory Visit: Payer: Medicare Other | Admitting: Internal Medicine

## 2020-01-28 DEATH — deceased

## 2020-03-11 ENCOUNTER — Ambulatory Visit: Payer: Medicare Other | Admitting: Neurology

## 2020-06-08 ENCOUNTER — Encounter (HOSPITAL_COMMUNITY): Payer: Self-pay
